# Patient Record
Sex: Female | Born: 1960 | Race: White | Hispanic: No | Marital: Married | Smoking: Never smoker
Health system: Southern US, Community
[De-identification: ages and names within clinical notes are randomized; demographics above are authoritative.]

## PROBLEM LIST (undated history)

## (undated) DIAGNOSIS — J329 Chronic sinusitis, unspecified: Secondary | ICD-10-CM

## (undated) DIAGNOSIS — C4490 Unspecified malignant neoplasm of skin, unspecified: Secondary | ICD-10-CM

## (undated) DIAGNOSIS — C439 Malignant melanoma of skin, unspecified: Secondary | ICD-10-CM

## (undated) DIAGNOSIS — H269 Unspecified cataract: Secondary | ICD-10-CM

## (undated) DIAGNOSIS — J310 Chronic rhinitis: Secondary | ICD-10-CM

## (undated) DIAGNOSIS — J40 Bronchitis, not specified as acute or chronic: Secondary | ICD-10-CM

## (undated) DIAGNOSIS — M329 Systemic lupus erythematosus, unspecified: Secondary | ICD-10-CM

## (undated) HISTORY — PX: OTHER SURGICAL HISTORY: SHX169

## (undated) HISTORY — DX: Malignant melanoma of skin, unspecified: C43.9

## (undated) HISTORY — PX: BREAST LUMPECTOMY: SHX2

## (undated) HISTORY — DX: Unspecified malignant neoplasm of skin, unspecified: C44.90

## (undated) HISTORY — DX: Bronchitis, not specified as acute or chronic: J40

## (undated) HISTORY — DX: Systemic lupus erythematosus, unspecified: M32.9

## (undated) HISTORY — PX: ACHILLES TENDON REPAIR: SUR1153

## (undated) HISTORY — DX: Unspecified cataract: H26.9

## (undated) HISTORY — PX: ABDOMINAL HYSTERECTOMY: SHX81

## (undated) HISTORY — DX: Chronic sinusitis, unspecified: J32.9

## (undated) HISTORY — PX: KNEE ARTHROPLASTY: SHX992

## (undated) HISTORY — DX: Chronic rhinitis: J31.0

---

## 1998-06-03 ENCOUNTER — Other Ambulatory Visit: Admission: RE | Admit: 1998-06-03 | Discharge: 1998-06-03 | Payer: Self-pay | Admitting: *Deleted

## 1999-06-24 ENCOUNTER — Other Ambulatory Visit: Admission: RE | Admit: 1999-06-24 | Discharge: 1999-06-24 | Payer: Self-pay | Admitting: *Deleted

## 1999-12-09 ENCOUNTER — Encounter (INDEPENDENT_AMBULATORY_CARE_PROVIDER_SITE_OTHER): Payer: Self-pay | Admitting: *Deleted

## 1999-12-09 ENCOUNTER — Other Ambulatory Visit: Admission: RE | Admit: 1999-12-09 | Discharge: 1999-12-09 | Payer: Self-pay | Admitting: *Deleted

## 2000-07-07 ENCOUNTER — Other Ambulatory Visit: Admission: RE | Admit: 2000-07-07 | Discharge: 2000-07-07 | Payer: Self-pay | Admitting: *Deleted

## 2000-07-18 ENCOUNTER — Encounter: Admission: RE | Admit: 2000-07-18 | Discharge: 2000-07-18 | Payer: Self-pay | Admitting: *Deleted

## 2000-07-18 ENCOUNTER — Encounter: Payer: Self-pay | Admitting: *Deleted

## 2009-12-14 ENCOUNTER — Telehealth: Payer: Self-pay | Admitting: Internal Medicine

## 2009-12-14 ENCOUNTER — Ambulatory Visit: Payer: Self-pay | Admitting: Internal Medicine

## 2009-12-14 DIAGNOSIS — J3089 Other allergic rhinitis: Secondary | ICD-10-CM

## 2009-12-14 DIAGNOSIS — M329 Systemic lupus erythematosus, unspecified: Secondary | ICD-10-CM

## 2009-12-14 DIAGNOSIS — J302 Other seasonal allergic rhinitis: Secondary | ICD-10-CM

## 2009-12-14 DIAGNOSIS — Z85820 Personal history of malignant melanoma of skin: Secondary | ICD-10-CM

## 2009-12-14 DIAGNOSIS — I10 Essential (primary) hypertension: Secondary | ICD-10-CM | POA: Insufficient documentation

## 2009-12-14 DIAGNOSIS — J40 Bronchitis, not specified as acute or chronic: Secondary | ICD-10-CM | POA: Insufficient documentation

## 2009-12-14 LAB — CONVERTED CEMR LAB: IgE (Immunoglobulin E), Serum: 9.6 intl units/mL (ref 0.0–180.0)

## 2010-01-18 ENCOUNTER — Ambulatory Visit: Payer: Self-pay | Admitting: Internal Medicine

## 2010-01-18 ENCOUNTER — Encounter: Payer: Self-pay | Admitting: Internal Medicine

## 2010-04-19 ENCOUNTER — Ambulatory Visit: Payer: Self-pay | Admitting: Internal Medicine

## 2010-10-18 ENCOUNTER — Ambulatory Visit: Payer: Self-pay | Admitting: Internal Medicine

## 2011-01-20 NOTE — Miscellaneous (Signed)
Summary: Orders Update pft charges  Clinical Lists Changes  Orders: Added new Service order of Carbon Monoxide diffusing w/capacity (94720) - Signed Added new Service order of Lung Volumes (94240) - Signed Added new Service order of Spirometry (Pre & Post) (94060) - Signed 

## 2011-01-20 NOTE — Assessment & Plan Note (Signed)
Summary: ROV 6 MONTHS///KP   Primary Provider/Referring Provider:  Deans  CC:  6 month follow up visit-sinus congestion and pressure when sneezing and coughing(mainly in forehead area)..  History of Present Illness: January 18, 2010- Bronchitis, Lupus, Allergy...............................Marland Kitchenwith husband Here to review PFT and for skin testing as planned. She started right frontal headache, bad taste, nasal congestion 2 days ago. Today woke hoarse. Chest is ok and denies fever or purulent discharge. PFT-WNL except borderline low Diffusion. FEV1/FVC 0.86 with small airways improved after dilator. Skin test-  Apr 19, 2010- Bronchitis, Lupus, Allergy She has not had bronchitis this Spring, but otherwise says this year is similar to last year. Her opthalmologist suggested otc eyedrop, so she is using Visine.AC. We discussed antihistamines and decongestants. She has been doing the dust control measures etc that we taught her last time. She is very averse to needles and doesn't consider allergy vaccine a real option now.,  October 18, 2010- Bronchitis, Lupus, Allergy Since last here here has done fairly well. Was in [place] where she got very large local reactions to mosquito bites. Wheeze and cough have done much better. Her Singulair and rescue inhaler control bronchitis ok. Cleaning window screens tightened her chest. Uses her rescue inhaler up to 4x/day since beginning of September. Insurance didn't approve Advair.  Sinuses have bothered her. For 2 weeks has had tussive sharp pain in right frontal area, stuffy, nonproducitve. Ears ok Throat- postnasal drip. Does not like Neti pot experience.   Preventive Screening-Counseling & Management  Alcohol-Tobacco     Smoking Status: never  Current Medications (verified): 1)  Synthroid 50 Mcg Tabs (Levothyroxine Sodium) .... Take 1 Tablet By Mouth Once A Day 2)  Claritin 10 Mg Tabs (Loratadine) .... Take 1 Tablet By Mouth Once A Day 3)   Lisinopril 30 Mg Tabs (Lisinopril) .... Take 1 By Mouth Once Daily 4)  Nasonex 50 Mcg/act Susp (Mometasone Furoate) .Marland Kitchen.. 1-2 Sprays Each Nostril Once Daily 5)  Centrum Performance  Tabs (Specialty Vitamins Products) .... Take 1 Tablet By Mouth Once A Day 6)  Vitamin C 500 Mg  Tabs (Ascorbic Acid) .... Take 1 Tablet By Mouth Once A Day With Food 7)  Hydroxychloroquine Sulfate 200 Mg Tabs (Hydroxychloroquine Sulfate) .... Take 1 Tablet By Mouth Two Times A Day W/ Meal 8)  Vitamin D 1000 Unit Tabs (Cholecalciferol) .... Take 1 Tablet By Mouth Once A Day 9)  Singulair 10 Mg Tabs (Montelukast Sodium) .... Take 1 Tablet By Mouth Once A Day 10)  Ventolin Hfa 108 (90 Base) Mcg/act Aers (Albuterol Sulfate) .... Inhale 2 Puffs Every Four Hours As Needed 11)  Sulindac 200 Mg Tabs (Sulindac) .... Take 1 Tablet By Mouth Two Times A Day As Needed 12)  Clotrimazole 1 % Crea (Clotrimazole) .... Apply As Needed For Rash/itching 13)  Nabumetone 750 Mg Tabs (Nabumetone) .... Take 1 Tablet By Mouth Two Times A Day As Needed 14)  Mupirocin 2 % Oint (Mupirocin) .... Apply Every 3 Hours On Skin Abrasion 15)  Align  Caps (Probiotic Product) .... Take 1 By Mouth Once Daily 16)  Biotene Pbf Dry Mouth  Liqd (Mouthwashes) .... As Directed 17)  Vitamin B Complex  Tabs (B Complex Vitamins) .... Take 1 By Mouth Once Daily 18)  Nuvigil 250 Mg Tabs (Armodafinil) .... Take 1/2 By Mouth Once Daily  Allergies (verified): 1)  ! Neosporin 2)  ! 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   Risk Factors: Smoking Status: never (10/18/2010)  Review of Systems      See HPI       The patient complains of shortness of breath with activity and non-productive cough.  The patient denies shortness of breath at rest, productive cough, coughing up blood, chest pain, irregular heartbeats, acid heartburn, indigestion, loss of appetite, weight change, abdominal pain, difficulty swallowing, sore throat, tooth/dental problems, headaches, nasal congestion/difficulty breathing through nose, sneezing, itching, and fever.    Vital Signs:  Patient profile:   50 year old female Height:      63 inches Weight:      196.50 pounds BMI:     34.93 O2 Sat:      99 % on Room air Pulse rate:   75 / minute BP sitting:   122 / 78  (left arm) Cuff size:   regular  Vitals Entered By: Reynaldo Minium CMA (October 18, 2010 4:32 PM)  O2 Flow:  Room air CC: 6 month follow up visit-sinus congestion and pressure when sneezing and coughing(mainly in forehead area).   Physical Exam  Additional Exam:  General: A/Ox3; pleasant and cooperative, NAD, SKIN: no rash, lesions NODES: no lymphadenopathy HEENT: [place]/AT, EOM- WNL, Conjuctivae- clear, PERRLA, TM-WNL, Nose- clear, narrower on right, turbinate edema, Throat- clear and wnl, Mallampati  II NECK: Supple w/ fair ROM, JVD- none, normal carotid impulses w/o bruits Thyroid- normal to palpation CHEST: Clear to P&A HEART: RRR, no m/g/r heard ABDOMEN: Soft and nl; n ZOX:WRUE, nl pulses, no edema  NEURO: Grossly intact to observation      Impression & Recommendations:  Problem # 1:  ALLERGY, HX OF (ICD-V15.09)  Rhinitis. She has nasonex and is cautious about systemic steroids due to her lupus. We will have her use a decongestant and Musiniex  Problem # 2:  BRONCHITIS (ICD-490)  She is depending too much on her rescue inhaler. We will have her try sample Symbicort with explanation.    Her updated medication list for this problem includes:    Singulair 10 Mg Tabs (Montelukast sodium) .Marland Kitchen... Take 1 tablet by mouth once a day    Ventolin Hfa 108 (90 Base) Mcg/act Aers (Albuterol sulfate) ..... Inhale 2 puffs every four hours as needed    Symbicort 160-4.5 Mcg/act Aero (Budesonide-formoterol fumarate) .Marland Kitchen... 2 puffs and rinse mouth, twice daily  Problem # 3:  BRONCHITIS (ICD-490)  Medications Added to Medication List This Visit: 1)  Vitamin B Complex Tabs (B complex vitamins) .... Take 1 by mouth once daily 2)  Nuvigil 250 Mg Tabs (Armodafinil) .... Take 1/2 by mouth once daily 3)  Symbicort 160-4.5 Mcg/act Aero (Budesonide-formoterol fumarate) .... 2 puffs and rinse mouth, twice daily  Other Orders: Est. Patient Level IV (45409)   Patient Instructions: 1)  Please schedule a follow-up appointment in 6 months. 2)  Sample / script Symbicort 160/4.5 maintenance inhaler; 3)   2 puffs and rinse mouth, twice every day 4)  You can still use the rescue inhaler in addition as needed 5)  try Mucinex as a mucus thinner, and phenylephrine (Sudafed-PE and others) otc as a  decongestant Prescriptions: SYMBICORT 160-4.5 MCG/ACT AERO (BUDESONIDE-FORMOTEROL FUMARATE) 2 puffs and rinse mouth, twice daily  #1 x prn   Entered and Authorized by:   Waymon Budge MD   Signed by:   Waymon Budge MD on 10/18/2010   Method used:   Print then Give to Patient   RxID:   [id number]

## 2011-01-20 NOTE — Miscellaneous (Signed)
Summary: Occupational psychologist   Imported By: Lester [place] 01/27/2010 09:14:57  _____________________________________________________________________  External Attachment:    Type:   Image     Comment:   External Document

## 2011-01-20 NOTE — Assessment & Plan Note (Signed)
Summary: allergy testing after PFT/kcw   Vital Signs:  Patient profile:   50 year old female Height:      63 inches Weight:      198 pounds O2 Sat:      99 % on Room air Pulse rate:   87 / minute BP sitting:   152 / 84  (left arm) Cuff size:   regular  Vitals Entered By: Reynaldo Minium CMA (January 18, 2010 2:05 PM)  O2 Flow:  Room air  Primary Provider/Referring Provider:  Deans   History of Present Illness: December 14, 2009-  48 yoF seen in allergy referral at kind request of Dr Quintella Reichert Ronnald Collum Deans in [place]. She reports "allergy" since her teens, with nasal congestion, headaches, sneezing, and a few episodes of hives. She has had several episodes of sinusitis and bronchitis but not dx'd with asthma, Pollen seasons and barn dust exposure have been triggers.  She had persistent tight chest this Fall in October. 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 Frequent colds. Never smked. [place] had laryngoscopy in high school for laryngitis. reports allergy/ intolerance to onions, strawberries, milk, pineapple. Also outdoors to alfalfa, hay, straw, bee stings, latex in bandaids.  January 18, 2010- Bronchitis, Lupus, Allergy...............................Marland Kitchenwith husband Here to review PFT and for skin testing as planned. She started right frontal headache, bad taste, nasal congestion 2 days ago. Today woke hoarse. Chest is ok and denies fever or purulent discharge. PFT-WNL except borderline low Diffusion. FEV1/FVC 0.86 with small airways improved after dilator. Skin test-  Current Medications (verified): 1)  Synthroid 50 Mcg Tabs (Levothyroxine Sodium) .... Take 1 Tablet By Mouth Once A Day 2)  Claritin 10 Mg Tabs (Loratadine) .... Take 1 Tablet By Mouth Once A Day 3)  Lisinopril 20 Mg Tabs (Lisinopril) .... Take 1 Tablet By Mouth Once A Day 4)  Nasonex 50 Mcg/act Susp (Mometasone Furoate) .Marland Kitchen.. 1-2 Sprays Each Nostril Once Daily 5)  Centrum  Performance  Tabs (Specialty Vitamins Products) .... Take 1 Tablet By Mouth Once A Day 6)  Vitamin C 500 Mg  Tabs (Ascorbic Acid) .... Take 1 Tablet By Mouth Once A Day With Food 7)  Hydroxychloroquine Sulfate 200 Mg Tabs (Hydroxychloroquine Sulfate) .... Take 1 Tablet By Mouth Two Times A Day W/ Meal 8)  Vitamin D 1000 Unit Tabs (Cholecalciferol) .... Take 1 Tablet By Mouth Once A Day 9)  Singulair 10 Mg Tabs (Montelukast Sodium) .... Take 1 Tablet By Mouth Once A Day 10)  Ventolin Hfa 108 (90 Base) Mcg/act Aers (Albuterol Sulfate) .... Inhale 2 Puffs Every Four Hours As Needed 11)  Sulindac 200 Mg Tabs (Sulindac) .... Take 1 Tablet By Mouth Two Times A Day As Needed 12)  Clotrimazole 1 % Crea (Clotrimazole) .... Apply As Needed For Rash/itching 13)  Nabumetone 750 Mg Tabs (Nabumetone) .... Take 1 Tablet By Mouth Two Times A Day As Needed 14)  Mupirocin 2 % Oint (Mupirocin) .... Apply Every 3 Hours On Skin Abrasion  Allergies (verified): 1)  ! * Latec 2)  ! Neosporin  Past History:  Past Surgical History: Last updated: 12/14/2009 hysterectomy 11/1999  Family History: Last updated: 12/14/2009 allergies - father heart disease - father died bacterial endocarditis cancer - MGM (pancreatic)  Social History: Last updated: 12/14/2009 pt lives with her husband, 3 boys works as a teacher--k-5 gifted ed in [employer] Patient never smoked.   Risk Factors: Smoking Status: never (12/14/2009)  Past Medical History:  Allergic rhinitis- Skin Test 01/18/10 bronchitis rhinosinusitis systemic lupus- joints and skin rash- Dr Corliss Skains  Review of Systems      See HPI  The patient denies anorexia, fever, weight loss, weight gain, vision loss, decreased hearing, hoarseness, chest pain, syncope, dyspnea on exertion, peripheral edema, prolonged cough, headaches, hemoptysis, abdominal pain, and severe indigestion/heartburn.    Physical Exam  Additional Exam:  General: A/Ox3; pleasant and  cooperative, NAD, stocky SKIN: no rash, lesions. Minor flush on lateral cheeks NODES: no lymphadenopathy HEENT: [place]/AT, EOM- WNL, Conjuctivae- clear, PERRLA, TM-WNL, Nose- turbinate edema, Throat- slight;ly red, [place]  II-III, hoarse  NECK: Supple w/ fair ROM, JVD- none, normal carotid impulses w/o bruits Thyroid- normal to palpation CHEST: Clear to P&A, slight cough after laughter HEART: RRR, no m/g/r heard ABDOMEN: Soft and nl; nml bowel sounds; no organomegaly or masses noted ZOX:WRUE, nl pulses, no edema  NEURO: Grossly intact to observation   General: A/Ox3; pleasant and cooperative, NAD, stocky SKIN: no rash, lesions. Minor flush on lateral cheeks NODES: no lymphadenopathy HEENT: /AT, EOM- WNL, Conjuctivae- clear, PERRLA, TM-WNL, Nose- turbinate edema, Throat- slight;ly red, [place]  II-III, hoarse  NECK: Supple w/ fair ROM, JVD- none, normal carotid impulses w/o bruits Thyroid- normal to palpation CHEST: Clear to P&A, slight cough after laughter HEART: RRR, no m/g/r heard ABDOMEN: Soft and nl; nml bowel sounds; no organomegaly or masses noted AVW:UJWJ, nl pulses, no edema  NEURO: Grossly intact to observation      Impression & Recommendations:  Problem # 1:  BRONCHITIS (ICD-490)  Now with an early viral syndrome and probable mild frontal siusitis. Her updated medication list for this problem includes:    Singulair 10 Mg Tabs (Montelukast sodium) .Marland Kitchen... Take 1 tablet by mouth once a day    Ventolin Hfa 108 (90 Base) Mcg/act Aers (Albuterol sulfate) ..... Inhale 2 puffs every four hours as needed  Problem # 2:  ALLERGY, HX OF (ICD-V15.09)  Significant positive skin testing. We discussed available therapies and gave print info with discussion of environmental precautions. She wants to stick with present treatments for now. We will bring her back in the Spring around the grass pollen season when she predicts symptoms.  Other Orders: Est. Patient Level II  (19147)  Patient Instructions: 1)  Please schedule a follow-up appointment in 3 months. 2)  Consider the allergy avoidance measures we discussed 3)  Continue present meds 4)  Clarinex 5 mg given today after skin testing

## 2011-01-20 NOTE — Assessment & Plan Note (Signed)
Summary: 3 months/apc   Primary Provider/Referring Provider:  Deans  CC:  3 month follow up visit.  History of Present Illness: December 14, 2009-  50 yoF seen in allergy referral at kind request of Dr Quintella Reichert Ronnald Collum Deans in [place]. She reports "allergy" since her teens, with nasal congestion, headaches, sneezing, and a few episodes of hives. She has had several episodes of sinusitis and bronchitis but not dx'd with asthma, Pollen seasons and barn dust exposure have been triggers.  She had persistent tight chest this Fall in October. 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 Frequent colds. Never smked. [place] farm. had laryngoscopy in high school for laryngitis. reports allergy/ intolerance to onions, strawberries, milk, pineapple. Also outdoors to alfalfa, hay, straw, bee stings, latex in bandaids.  January 18, 2010- Bronchitis, Lupus, Allergy...............................Marland Kitchenwith husband Here to review PFT and for skin testing as planned. She started right frontal headache, bad taste, nasal congestion 2 days ago. Today woke hoarse. Chest is ok and denies fever or purulent discharge. PFT-WNL except borderline low Diffusion. FEV1/FVC 0.86 with small airways improved after dilator. Skin test-  Apr 19, 2010- Bronchitis, Lupus, Allergy She has not had bronchitis this Spring, but otherwise says this year is similar to last year. Her opthalmologist suggested otc eyedrop, so she is using Visine.AC. We discussed antihistamines and decongestants. She has been doing the dust control measures etc that we taught her last time. She is very averse to needles and doesn't consider allergy vaccine a real option now.,   Current Medications (verified): 1)  Synthroid 50 Mcg Tabs (Levothyroxine Sodium) .... Take 1 Tablet By Mouth Once A Day 2)  Claritin 10 Mg Tabs (Loratadine) .... Take 1 Tablet By Mouth Once A Day 3)  Lisinopril 30 Mg Tabs (Lisinopril) .... Take 1 By Mouth  Once Daily 4)  Nasonex 50 Mcg/act Susp (Mometasone Furoate) .Marland Kitchen.. 1-2 Sprays Each Nostril Once Daily 5)  Centrum Performance  Tabs (Specialty Vitamins Products) .... Take 1 Tablet By Mouth Once A Day 6)  Vitamin C 500 Mg  Tabs (Ascorbic Acid) .... Take 1 Tablet By Mouth Once A Day With Food 7)  Hydroxychloroquine Sulfate 200 Mg Tabs (Hydroxychloroquine Sulfate) .... Take 1 Tablet By Mouth Two Times A Day W/ Meal 8)  Vitamin D 1000 Unit Tabs (Cholecalciferol) .... Take 1 Tablet By Mouth Once A Day 9)  Singulair 10 Mg Tabs (Montelukast Sodium) .... Take 1 Tablet By Mouth Once A Day 10)  Ventolin Hfa 108 (90 Base) Mcg/act Aers (Albuterol Sulfate) .... Inhale 2 Puffs Every Four Hours As Needed 11)  Sulindac 200 Mg Tabs (Sulindac) .... Take 1 Tablet By Mouth Two Times A Day As Needed 12)  Clotrimazole 1 % Crea (Clotrimazole) .... Apply As Needed For Rash/itching 13)  Nabumetone 750 Mg Tabs (Nabumetone) .... Take 1 Tablet By Mouth Two Times A Day As Needed 14)  Mupirocin 2 % Oint (Mupirocin) .... Apply Every 3 Hours On Skin Abrasion 15)  Align  Caps (Probiotic Product) .... Take 1 By Mouth Once Daily 16)  Biotene Pbf Dry Mouth  Liqd (Mouthwashes) .... As Directed  Allergies (verified): 1)  ! Neosporin 2)  ! 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   Risk Factors: Smoking Status: never (12/14/2009)  Review of Systems      See HPI  The patient denies anorexia, fever, weight loss,  weight gain, vision loss, decreased hearing, hoarseness, chest pain, syncope, dyspnea on exertion, peripheral edema, prolonged cough, headaches, hemoptysis, abdominal pain, and severe indigestion/heartburn.    Vital Signs:  Patient profile:   50 year old female Height:      63 inches Weight:      200 pounds BMI:     35.56 O2 Sat:      99 % on Room air Pulse rate:   66 / minute BP sitting:   132 / 76  (left arm) Cuff size:   regular  Vitals Entered By: Reynaldo Minium CMA (Apr 19, 2010 4:25 PM)  O2 Flow:  Room air  Physical Exam  Additional Exam:  General: A/Ox3; pleasant and cooperative, NAD, SKIN: no rash, lesions NODES: no lymphadenopathy HEENT: [place]/AT, EOM- WNL, Conjuctivae- clear, PERRLA, TM-WNL, Nose- clear, narrower on right, Throat- clear and wnl, Mallampati  II NECK: Supple w/ fair ROM, JVD- none, normal carotid impulses w/o bruits Thyroid- normal to palpation CHEST: Clear to P&A HEART: RRR, no m/g/r heard ABDOMEN: Soft and nl; n EAV:WUJW, nl pulses, no edema  NEURO: Grossly intact to observation      Impression & Recommendations:  Problem # 1:  ALLERGY, HX OF (ICD-V15.09)  She will try combinations of claritin and Sudafed_PE. We can return to discussion of allergy vaccine in the future if needed.  Problem # 2:  BRONCHITIS (ICD-490)  This had been an important concern for  her but has largely cleared for now. Her updated medication list for this problem includes:    Singulair 10 Mg Tabs (Montelukast sodium) .Marland Kitchen... Take 1 tablet by mouth once a day    Ventolin Hfa 108 (90 Base) Mcg/act Aers (Albuterol sulfate) ..... Inhale 2 puffs every four hours as needed  Medications Added to Medication List This Visit: 1)  Lisinopril 30 Mg Tabs (Lisinopril) .... Take 1 by mouth once daily 2)  Align Caps (Probiotic product) .... Take 1 by mouth once daily 3)  Biotene Pbf Dry Mouth Liqd (Mouthwashes) .... As directed  Other Orders: Est. Patient Level III (11914)  Patient  Instructions: 1)  Please schedule a follow-up appointment in 6 months. 2)  Use a generic otc antihistamine like Loratadine (Claritin), or  3)  cetirizine (Zyrtec) if needed for drainage, runny nose, itching 4)  Try otc decongestant Sudafed-PE as needed, either by itself or with an antihistamine.  5)  You can continue your other meds as you find them helpful.

## 2011-03-14 ENCOUNTER — Telehealth: Payer: Self-pay | Admitting: Internal Medicine

## 2011-03-14 MED ORDER — MOMETASONE FUROATE 50 MCG/ACT NA SUSP: 2.0000 | Freq: Every day | NASAL | Status: DC

## 2011-03-14 NOTE — Telephone Encounter (Signed)
Spoke with pt to verify request.  Rx for nasonex was sent to pharm. Pt advised to keep April appt with CDY,

## 2011-03-14 NOTE — Telephone Encounter (Signed)
lmomtcb x1  [place], CMA  

## 2011-04-15 ENCOUNTER — Encounter: Payer: Self-pay | Admitting: Internal Medicine

## 2011-04-18 ENCOUNTER — Ambulatory Visit (INDEPENDENT_AMBULATORY_CARE_PROVIDER_SITE_OTHER): Payer: BC Managed Care – PPO | Admitting: Internal Medicine

## 2011-04-18 ENCOUNTER — Encounter: Payer: Self-pay | Admitting: Internal Medicine

## 2011-04-18 VITALS — BP 130/74 | HR 77 | Ht 63.0 in | Wt 188.6 lb

## 2011-04-18 DIAGNOSIS — J40 Bronchitis, not specified as acute or chronic: Secondary | ICD-10-CM

## 2011-04-18 DIAGNOSIS — Z9109 Other allergy status, other than to drugs and biological substances: Secondary | ICD-10-CM

## 2011-04-18 MED ORDER — CLOTRIMAZOLE-BETAMETHASONE 1-0.05 % EX CREA: TOPICAL_CREAM | CUTANEOUS | Status: AC

## 2011-04-18 MED ORDER — MONTELUKAST SODIUM 10 MG PO TABS: 10.0000 mg | ORAL_TABLET | Freq: Every day | ORAL | Status: DC

## 2011-04-18 MED ORDER — MUPIROCIN 2 % EX OINT: 1.0000 "application " | TOPICAL_OINTMENT | Freq: Three times a day (TID) | CUTANEOUS | Status: DC

## 2011-04-18 MED ORDER — PHENYLEPHRINE HCL 1 % NA SOLN
3.0000 [drp] | Freq: Once | NASAL | Status: AC
  Administered 2011-04-18: 3 [drp] via NASAL

## 2011-04-18 MED ORDER — METHYLPREDNISOLONE ACETATE 80 MG/ML IJ SUSP
80.0000 mg | Freq: Once | INTRAMUSCULAR | Status: AC
  Administered 2011-04-18: 80 mg via INTRAMUSCULAR

## 2011-04-18 NOTE — Patient Instructions (Signed)
Neb neo nasal  Depo 80  Please call as needed.  Med refills

## 2011-04-18 NOTE — Progress Notes (Signed)
  Subjective:    Patient ID: [place], female    DOB: Jul 15, 1961, 50 y.o.   MRN: [id number]  HPI 50 yoF followed for allergy and bronchitis. Last here October 18, 2010. She did ok during the winter till she went on vacation to [place] then [place] in [place] where she blames their early pollen season for starting retroorbital pressure hoarseness. Sudafed helps some. -PE doesn't help. Sense of smell ok. Denies wheeze. Scant mucus is clear. Denies fever or sore throat. Does not like Net pot. Continues nasonex, claritin and Singulair. Has not needed her rescue inhaler much at all.   Review of Systems Constitutional:   No weight loss, night sweats,  Fevers, chills, fatigue, lassitude. HEENT:   No headaches,  Difficulty swallowing,  Tooth/dental problems,  Sore throat,                No sneezing, itching, ear ache, post nasal drip,   CV:  No chest pain,  Orthopnea, PND, swelling in lower extremities, anasarca, dizziness, palpitations  GI  No heartburn, indigestion, abdominal pain, nausea, vomiting, diarrhea, change in bowel habits, loss of appetite  Resp: No shortness of breath with exertion or at rest.  No excess mucus, no productive cough,  No non-productive cough,  No coughing up of blood.  No change in color of mucus.  No wheezing. Skin: no rash or lesions.  GU: no dysuria, change in color of urine, no urgency or frequency.  No flank pain.  MS:  No joint pain or swelling.  No decreased range of motion.  No back pain.  Psych:  No change in mood or affect. No depression or anxiety.  No memory loss.      Objective:   Physical Exam General- Alert, Oriented, Affect-appropriate, Distress- none acute  Skin- rash-none, lesions- none, excoriation- none  Lymphadenopathy- none  Head- atraumatic  Eyes- Gross vision intact, PERRLA, conjunctivae clear secretions  Ears-Normal- Hearing, canals, Tm Nose- Clear, No-Septal dev, mucus, polyps, erosion, perforation   Throat- Mallampati  II , mucosa clear , drainage- none, tonsils- atrophic    Very hoarse  Neck- flexible , trachea midline, no stridor , thyroid nl, carotid no bruit  Chest - symmetrical excursion , unlabored     Heart/CV- RRR , no murmur , no gallop  , no rub, nl s1 s2                     - JVD- none , edema- none, stasis changes- none, varices- none     Lung- clear to P&A, wheeze- none, cough- none , dullness-none, rub- none     Chest wall- Abd- tender-no, distended-no, bowel sounds-present, HSM- no  Br/ Gen/ Rectal- Not done, not indicated  Extrem- cyanosis- none, clubbing, none, atrophy- none, strength- nl  Neuro- grossly intact to observation         Assessment & Plan:

## 2011-04-21 ENCOUNTER — Encounter: Payer: Self-pay | Admitting: Internal Medicine

## 2011-04-21 NOTE — Assessment & Plan Note (Addendum)
Current upper airway complaints are likely due to seasonal pollens and allergic rhinitis. Antihistamines and Nasonex are usually sufficient. For now we will try neb and depo to regain control.

## 2011-04-21 NOTE — Assessment & Plan Note (Signed)
Lower respiratory symptoms are much better controlled at present. Meds reviewed.

## 2011-09-19 DIAGNOSIS — R413 Other amnesia: Secondary | ICD-10-CM

## 2011-10-24 ENCOUNTER — Encounter: Payer: Self-pay | Admitting: Internal Medicine

## 2011-10-24 ENCOUNTER — Ambulatory Visit (INDEPENDENT_AMBULATORY_CARE_PROVIDER_SITE_OTHER): Payer: BC Managed Care – PPO | Admitting: Internal Medicine

## 2011-10-24 VITALS — BP 132/82 | HR 81 | Ht 63.0 in | Wt 183.0 lb

## 2011-10-24 DIAGNOSIS — J019 Acute sinusitis, unspecified: Secondary | ICD-10-CM

## 2011-10-24 DIAGNOSIS — Z9109 Other allergy status, other than to drugs and biological substances: Secondary | ICD-10-CM

## 2011-10-24 MED ORDER — BUDESONIDE-FORMOTEROL FUMARATE 160-4.5 MCG/ACT IN AERO: 2.0000 | INHALATION_SPRAY | Freq: Two times a day (BID) | RESPIRATORY_TRACT | Status: DC

## 2011-10-24 MED ORDER — MOMETASONE FUROATE 50 MCG/ACT NA SUSP: 2.0000 | Freq: Every day | NASAL | Status: DC

## 2011-10-24 MED ORDER — AMOXICILLIN-POT CLAVULANATE 875-125 MG PO TABS: 1.0000 | ORAL_TABLET | Freq: Two times a day (BID) | ORAL | Status: AC

## 2011-10-24 NOTE — Assessment & Plan Note (Signed)
Acute right maxillary sinusitis with laryngitis. She dislikes using a saline lavage. She will continue Nasonex and we are adding Augmentin.

## 2011-10-24 NOTE — Assessment & Plan Note (Signed)
She'll continue taking antihistamine. Consider food diary. Avoid suspect foods, understanding that is the key treatment.

## 2011-10-24 NOTE — Patient Instructions (Addendum)
Refill scripts sent  Script for antibiotic sent  Please call as needed  Flu vax

## 2011-10-24 NOTE — Progress Notes (Signed)
Patient ID: Misty Curtis, female    DOB: December 08, 1961, 50 y.o.   MRN: [id number]  HPI 68 yoF followed for allergy and bronchitis. Last here October 18, 2010. She did ok during the winter till she went on vacation to [place] then [place] in [place] where she blames their early pollen season for starting retroorbital pressure hoarseness. Sudafed helps some. -PE doesn't help. Sense of smell ok. Denies wheeze. Scant mucus is clear. Denies fever or sore throat. Does not like Net pot. Continues nasonex, claritin and Singulair. Has not needed her rescue inhaler much at all.   10/24/11- [name] followed for allergy and bronchitis, complicated by HBP, lupus.Marland Kitchen PCP Dr Nathanial Rancher She blames dust after raking leaves and seasonal weather change for hoarseness in recent days. Right maxillary sinus pressure. Has to blow her nose out each morning. Episode of chest tightness one week ago quickly responded to a rescue inhaler. Otherwise has not needed to use that inhaler. Denies fever sore throat or nodes. She says she depends on Nasonex, occasional Sudafed, saline rinse. She had an episode when she was chopping help jalapeno peppers- touch her hand with the juice to her lip and rapidly got angioedema of the lip. She knows to avoid that exposure.   Review of Systems Constitutional:   No-   weight loss, night sweats, fevers, chills, fatigue, lassitude. HEENT:   No-  headaches, difficulty swallowing, tooth/dental problems, sore throat,       No-  sneezing, itching, ear ache, +nasal congestion, post nasal drip,  CV:  No-   chest pain, orthopnea, PND, swelling in lower extremities, anasarca, dizziness, palpitations Resp: No-   shortness of breath with exertion or at rest.              No-   productive cough,  No non-productive cough,  No- coughing up of blood.              No-   change in color of mucus.  Some wheezing.   Skin: No-   rash or lesions. GI:  No-   heartburn, indigestion, abdominal pain, nausea,  vomiting, diarrhea,                 change in bowel habits, loss of appetite GU: No-   dysuria, change in color of urine, no urgency or frequency.  No- flank pain. MS:  No-   joint pain or swelling.  No- decreased range of motion.  No- back pain. Neuro-     nothing unusual Psych:  No- change in mood or affect. No depression or anxiety.  No memory loss.   Objective:   Physical Exam General- Alert, Oriented, Affect-appropriate, Distress- none acute Skin- rash-none, lesions- none, excoriation- none Lymphadenopathy- none Head- atraumatic            Eyes- Gross vision intact, PERRLA, conjunctivae clear secretions            Ears- Hearing, canals-normal            Nose- Clear, no-Septal dev, mucus, polyps, erosion, perforation             Throat- Mallampati II , mucosa clear , drainage- none, tonsils- atrophic. Distinctly forced but with no visible postnasal drainage and minimal redness of her pharynx. Neck- flexible , trachea midline, no stridor , thyroid nl, carotid no bruit Chest - symmetrical excursion , unlabored           Heart/CV- RRR , no murmur , no gallop  ,  no rub, nl s1 s2                           - JVD- none , edema- none, stasis changes- none, varices- none           Lung- clear to P&A, wheeze- none, cough- none , dullness-none, rub- none           Chest wall-  Abd- tender-no, distended-no, bowel sounds-present, HSM- no Br/ Gen/ Rectal- Not done, not indicated Extrem- cyanosis- none, clubbing, none, atrophy- none, strength- nl Neuro- grossly intact to observation

## 2012-04-27 ENCOUNTER — Ambulatory Visit (INDEPENDENT_AMBULATORY_CARE_PROVIDER_SITE_OTHER): Payer: BC Managed Care – PPO | Admitting: Internal Medicine

## 2012-04-27 ENCOUNTER — Encounter: Payer: Self-pay | Admitting: Internal Medicine

## 2012-04-27 VITALS — BP 116/74 | HR 82 | Ht 63.0 in | Wt 188.0 lb

## 2012-04-27 DIAGNOSIS — J40 Bronchitis, not specified as acute or chronic: Secondary | ICD-10-CM

## 2012-04-27 DIAGNOSIS — R0982 Postnasal drip: Secondary | ICD-10-CM

## 2012-04-27 NOTE — Progress Notes (Signed)
Patient ID: Misty Curtis, female    DOB: 1961/04/26, 51 y.o.   MRN: [id number]  HPI 51 yoF followed for allergy and bronchitis. Last here October 18, 2010. She did ok during the winter till she went on vacation to [place] then [place] in [place] where she blames their early pollen season for starting retroorbital pressure hoarseness. Sudafed helps some. -PE doesn't help. Sense of smell ok. Denies wheeze. Scant mucus is clear. Denies fever or sore throat. Does not like Net pot. Continues nasonex, claritin and Singulair. Has not needed her rescue inhaler much at all.   10/24/11- 51 yoF followed for allergy and bronchitis, complicated by HBP, lupus.Marland Kitchen PCP Dr Nathanial Rancher She blames dust after raking leaves and seasonal weather change for hoarseness in recent days. Right maxillary sinus pressure. Has to blow her nose out each morning. Episode of chest tightness one week ago quickly responded to a rescue inhaler. Otherwise has not needed to use that inhaler. Denies fever sore throat or nodes. She says she depends on Nasonex, occasional Sudafed, saline rinse. She had an episode when she was chopping help jalapeno peppers- touch her hand with the juice to her lip and rapidly got angioedema of the lip. She knows to avoid that exposure.  04/27/12- 51 yoF followed for allergy and bronchitis, complicated by HBP, lupus.Marland Kitchen PCP Dr Nathanial Rancher Increased laryngitis; ? wheezing episodes(wind was up today);feels tight in chest but not painful She felt well through the winter until spring. Has new heating and air-conditioning duct works in her home which helps. 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 Better temporarily when that unit was bleached. Denies reflux/GERD but does notice postnasal drip and gets wheezy. Using rescue inhaler not more than once per day.  Review of Systems-see HPI Constitutional:   No-   weight loss, night sweats, fevers, chills, fatigue, lassitude. HEENT:   No-  headaches,  difficulty swallowing, tooth/dental problems, sore throat,       No-  sneezing, itching, ear ache, +nasal congestion, +post nasal drip,  CV:  No-   chest pain, orthopnea, PND, swelling in lower extremities, anasarca, dizziness, palpitations Resp: No-   shortness of breath with exertion or at rest.              No-   productive cough,  No non-productive cough,  No- coughing up of blood.              No-   change in color of mucus.  Little wheezing.   Skin: No-   rash or lesions. GI:  No-   heartburn, indigestion, abdominal pain, nausea, vomiting,  GU:  MS:  No-   joint pain or swelling.  Neuro-     nothing unusual Psych:  No- change in mood or affect. No depression or anxiety.  No memory loss.   Objective:   Physical Exam General- Alert, Oriented, Affect-appropriate, Distress- none acute, obese Skin- rash-none, lesions- none, excoriation- none Lymphadenopathy- none Head- atraumatic            Eyes- Gross vision intact, PERRLA, conjunctivae clear secretions            Ears- Hearing, canals-normal            Nose- Clear, no-Septal dev, mucus, polyps, erosion, perforation             Throat- Mallampati II , mucosa clear , drainage- none, tonsils- atrophic. No visible postnasal drainage and minimal redness of her pharynx.Mildly raspy voice.  Neck- flexible , trachea midline, no stridor , thyroid nl, carotid no bruit Chest - symmetrical excursion , unlabored           Heart/CV- RRR , no murmur , no gallop  , no rub, nl s1 s2                           - JVD- none , edema- none, stasis changes- none, varices- none           Lung- clear to P&A, wheeze- none, cough- none , dullness-none, rub- none           Chest wall-  Abd-  Br/ Gen/ Rectal- Not done, not indicated Extrem- cyanosis- none, clubbing, none, atrophy- none, strength- nl Neuro- grossly intact to observation

## 2012-04-27 NOTE — Patient Instructions (Signed)
Sample Dymista nasal spray-  Try 1 or 2 puffs each nostril once daily at bedtime  Consider a HEPA air filter in your work place.   Ask Dr Nathanial Rancher about a trial of something (maybe an "ARB") instead of lisinopril for a month, to see if that affects your hoarseness and throat.

## 2012-05-02 DIAGNOSIS — R0982 Postnasal drip: Secondary | ICD-10-CM | POA: Insufficient documentation

## 2012-05-02 NOTE — Assessment & Plan Note (Signed)
This may be a chronic mild asthma/bronchitis. I would like her to try an alternative to lisinopril to see if that affects hoarseness and cough

## 2012-05-02 NOTE — Assessment & Plan Note (Signed)
Try sample Dymista Try HEPA filter in work place since she gets hoarse there

## 2012-05-06 ENCOUNTER — Other Ambulatory Visit: Payer: Self-pay | Admitting: Internal Medicine

## 2012-05-08 ENCOUNTER — Telehealth: Payer: Self-pay | Admitting: Internal Medicine

## 2012-05-08 MED ORDER — AZELASTINE-FLUTICASONE 137-50 MCG/ACT NA SUSP: 1.0000 | Freq: Every day | NASAL | Status: DC

## 2012-05-08 NOTE — Telephone Encounter (Signed)
I spoke with pt and she was needing rx for dymista sent to [facility]. I advised will send rx and nothing further was needed

## 2012-06-12 ENCOUNTER — Telehealth: Payer: Self-pay | Admitting: Internal Medicine

## 2012-06-12 MED ORDER — CLOTRIMAZOLE-BETAMETHASONE 1-0.05 % EX CREA: TOPICAL_CREAM | CUTANEOUS | Status: DC

## 2012-06-12 NOTE — Telephone Encounter (Signed)
Per CY-okay to refill as requested. 

## 2012-06-12 NOTE — Telephone Encounter (Signed)
[facility]  [address] requesting  Clotrimazole-betamethasone external cream 1-0.05%  <> apply to affected area twice daily  Allergies  Allergen Reactions  . Latex   . Neomycin-Bacitracin Zn-Polymyx     REACTION: rash   Dr Maple Hudson please advise  Thank you

## 2012-11-01 ENCOUNTER — Encounter: Payer: Self-pay | Admitting: Internal Medicine

## 2012-11-01 ENCOUNTER — Ambulatory Visit (INDEPENDENT_AMBULATORY_CARE_PROVIDER_SITE_OTHER): Payer: BC Managed Care – PPO | Admitting: Internal Medicine

## 2012-11-01 VITALS — BP 132/84 | HR 75 | Ht 63.0 in | Wt 187.8 lb

## 2012-11-01 DIAGNOSIS — J040 Acute laryngitis: Secondary | ICD-10-CM

## 2012-11-01 MED ORDER — AMOXICILLIN-POT CLAVULANATE 875-125 MG PO TABS: 1.0000 | ORAL_TABLET | Freq: Two times a day (BID) | ORAL | Status: DC

## 2012-11-01 MED ORDER — FLUCONAZOLE 150 MG PO TABS: ORAL_TABLET | ORAL | Status: DC

## 2012-11-01 NOTE — Patient Instructions (Addendum)
Script sent for augmentin   Script to hold for diflucan for yeast in case needed  Try using an otc decongestant like phenylephrine/  Sudafed-PE

## 2012-11-01 NOTE — Progress Notes (Signed)
Patient ID: Misty Curtis, female    DOB: [birth year], 51 y.o.   MRN: [id number]  HPI [name] followed for allergy and bronchitis. Last here October 18, 2010. She did ok during the winter till she went on vacation to [place] then [place] in [place] where she blames their early pollen season for starting retroorbital pressure hoarseness. Sudafed helps some. -PE doesn't help. Sense of smell ok. Denies wheeze. Scant mucus is clear. Denies fever or sore throat. Does not like Net pot. Continues nasonex, claritin and Singulair. Has not needed her rescue inhaler much at all.   10/24/11- [name] followed for allergy and bronchitis, complicated by HBP, lupus.Marland Kitchen PCP Dr Nathanial Rancher She blames dust after raking leaves and seasonal weather change for hoarseness in recent days. Right maxillary sinus pressure. Has to blow her nose out each morning. Episode of chest tightness one week ago quickly responded to a rescue inhaler. Otherwise has not needed to use that inhaler. Denies fever sore throat or nodes. She says she depends on Nasonex, occasional Sudafed, saline rinse. She had an episode when she was chopping help jalapeno peppers- touch her hand with the juice to her lip and rapidly got angioedema of the lip. She knows to avoid that exposure.  04/27/12- [name] followed for allergy and bronchitis, complicated by HBP, lupus.Marland Kitchen PCP Dr Nathanial Rancher Increased laryngitis; ? wheezing episodes(wind was up today);feels tight in chest but not painful She felt well through the winter until spring. Has new heating and air-conditioning duct works in her home which helps. 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 Better temporarily when that unit was bleached. Denies reflux/GERD but does notice postnasal drip and gets wheezy. Using rescue inhaler not more than once per day.  11/01/12- [name] followed for allergy and bronchitis, complicated by HBP, lupus.Marland Kitchen PCP Dr Nathanial Rancher FOLLOWS FOR: pt states that breathing has  improved since last OV but that voice "comes and goes". Having increased hoarseness more so this season than she did in the summer time. Pt would like left ear checked, pain and ear d/c in mandibular area--has eustachian tube. Had flu vaccine. 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 Blames wind and dust outdoors over last 2 weeks for increased hoarseness. She has to be outdoors a lot. Much nasal discharge. Using Dymista nasal spray.  Review of Systems-see HPI Constitutional:   No-   weight loss, night sweats, fevers, chills, fatigue, lassitude. HEENT:   No-  headaches, difficulty swallowing, tooth/dental problems, sore throat,       No-  sneezing, itching, +ear ache, +nasal congestion, +post nasal drip,  CV:  No-   chest pain, orthopnea, PND, swelling in lower extremities, anasarca, dizziness, palpitations Resp: No-   shortness of breath with exertion or at rest.              No-   productive cough,  No non-productive cough,  No- coughing up of blood.              No-   change in color of mucus.  Little wheezing.   Skin: No-   rash or lesions. GI:  No-   heartburn, indigestion, abdominal pain, nausea, vomiting,  GU:  MS:  No-   joint pain or swelling.  Neuro-     nothing unusual Psych:  No- change in mood or affect. No depression or anxiety.  No memory loss.  Objective:   Physical Exam General-  Alert, Oriented, Affect-appropriate, Distress- none acute, obese Skin- rash-none, lesions- none, excoriation- none Lymphadenopathy- none Head- atraumatic            Eyes- Gross vision intact, PERRLA, conjunctivae clear secretions            Ears- Hearing, canals-normal. TMs look normal            Nose- Clear, no-Septal dev, mucus, polyps, erosion, perforation             Throat- Mallampati II , mucosa clear , drainage- none, tonsils- atrophic. No visible postnasal drainage and minimal redness of her pharynx. +Mildly  hoarse Neck- flexible , trachea  midline, no stridor , thyroid nl, carotid no bruit Chest - symmetrical excursion , unlabored           Heart/CV- RRR , no murmur , no gallop  , no rub, nl s1 s2                           - JVD- none , edema- none, stasis changes- none, varices- none           Lung- clear to P&A, wheeze- none, cough- none , dullness-none, rub- none           Chest wall-  Abd-  Br/ Gen/ Rectal- Not done, not indicated Extrem- cyanosis- none, clubbing, none, atrophy- none, strength- nl Neuro- grossly intact to observation

## 2012-11-08 ENCOUNTER — Other Ambulatory Visit: Payer: Self-pay | Admitting: *Deleted

## 2012-11-08 MED ORDER — BUDESONIDE-FORMOTEROL FUMARATE 160-4.5 MCG/ACT IN AERO: 2.0000 | INHALATION_SPRAY | Freq: Two times a day (BID) | RESPIRATORY_TRACT | Status: DC

## 2012-11-10 DIAGNOSIS — J040 Acute laryngitis: Secondary | ICD-10-CM | POA: Insufficient documentation

## 2012-11-10 NOTE — Assessment & Plan Note (Signed)
Viral infection versus irritant. Some associated eustachian dysfunction. Can't exclude an early sinusitis. We will cover it all with Augmentin and Sudafed.

## 2012-12-17 ENCOUNTER — Other Ambulatory Visit: Payer: Self-pay | Admitting: Internal Medicine

## 2012-12-17 MED ORDER — MONTELUKAST SODIUM 10 MG PO TABS: 10.0000 mg | ORAL_TABLET | Freq: Every day | ORAL | Status: DC

## 2013-01-26 ENCOUNTER — Other Ambulatory Visit: Payer: Self-pay | Admitting: Internal Medicine

## 2013-02-12 ENCOUNTER — Telehealth: Payer: Self-pay | Admitting: Internal Medicine

## 2013-02-12 MED ORDER — AMOXICILLIN-POT CLAVULANATE 875-125 MG PO TABS: 1.0000 | ORAL_TABLET | Freq: Two times a day (BID) | ORAL | Status: DC

## 2013-02-12 NOTE — Telephone Encounter (Signed)
I spoke with pt and is aware of CDY recs. RX has been sent. Nothing further was needed 

## 2013-02-12 NOTE — Telephone Encounter (Signed)
Member # [id number].  Dymista approved from 02/11/13 through 02/12/14. Pharmacy notified.

## 2013-02-12 NOTE — Telephone Encounter (Signed)
Per CY-okay to give patient Augmentin 875 mg #14 take 1 po bid no refills.

## 2013-02-12 NOTE — Telephone Encounter (Signed)
Spoke with pt She is c/o sinus pressure, HA, foul taste in mouth, yellow coating on her tongue, low grade temp at HS only and hoarseness x 1 wk She denies sore throat, cough, wheezing, CP, chest tightness, SOB or other co's  Has tried vicks vapor rub and sudafed with no relief Please advise thanks! Last ov 11/01/12 Next ov 04/22/13 Allergies  Allergen Reactions  . Latex   . Neomycin-Bacitracin Zn-Polymyx     REACTION: rash

## 2013-05-02 ENCOUNTER — Ambulatory Visit (INDEPENDENT_AMBULATORY_CARE_PROVIDER_SITE_OTHER): Payer: BC Managed Care – PPO | Admitting: Internal Medicine

## 2013-05-02 ENCOUNTER — Encounter: Payer: Self-pay | Admitting: Internal Medicine

## 2013-05-02 VITALS — BP 110/80 | HR 79 | Ht 63.0 in | Wt 182.8 lb

## 2013-05-02 DIAGNOSIS — J019 Acute sinusitis, unspecified: Secondary | ICD-10-CM

## 2013-05-02 MED ORDER — AMOXICILLIN-POT CLAVULANATE 875-125 MG PO TABS: 1.0000 | ORAL_TABLET | Freq: Two times a day (BID) | ORAL | Status: DC

## 2013-05-02 MED ORDER — CLOTRIMAZOLE-BETAMETHASONE 1-0.05 % EX CREA: TOPICAL_CREAM | Freq: Two times a day (BID) | CUTANEOUS | Status: DC

## 2013-05-02 MED ORDER — MUPIROCIN 2 % EX OINT: 1.0000 "application " | TOPICAL_OINTMENT | Freq: Three times a day (TID) | CUTANEOUS | Status: DC

## 2013-05-02 MED ORDER — AZELASTINE-FLUTICASONE 137-50 MCG/ACT NA SUSP: 1.0000 | Freq: Every day | NASAL | Status: DC

## 2013-05-02 NOTE — Patient Instructions (Addendum)
Scripts for [employer], Dymista, lotrisone cream, augmentin  Please call as needed

## 2013-05-02 NOTE — Progress Notes (Signed)
Patient ID: Misty Curtis, female    DOB: 1961/01/11, 52 y.o.   MRN: [id number]  HPI [name] followed for allergy and bronchitis. Last here October 18, 2010. She did ok during the winter till she went on vacation to [place] then [place] in [place] where she blames their early pollen season for starting retroorbital pressure hoarseness. Sudafed helps some. -PE doesn't help. Sense of smell ok. Denies wheeze. Scant mucus is clear. Denies fever or sore throat. Does not like Net pot. Continues nasonex, claritin and Singulair. Has not needed her rescue inhaler much at all.   10/24/11- [name] followed for allergy and bronchitis, complicated by HBP, lupus.Marland Kitchen PCP Dr Misty Curtis She blames dust after raking leaves and seasonal weather change for hoarseness in recent days. Right maxillary sinus pressure. Has to blow her nose out each morning. Episode of chest tightness one week ago quickly responded to a rescue inhaler. Otherwise has not needed to use that inhaler. Denies fever sore throat or nodes. She says she depends on Nasonex, occasional Sudafed, saline rinse. She had an episode when she was chopping help jalapeno peppers- touch her hand with the juice to her lip and rapidly got angioedema of the lip. She knows to avoid that exposure.  04/27/12- [name] followed for allergy and bronchitis, complicated by HBP, lupus.Marland Kitchen PCP Dr Misty Curtis Increased laryngitis; ? wheezing episodes(wind was up today);feels tight in chest but not painful She felt well through the winter until spring. Has new heating and air-conditioning duct works in her home which helps. 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 Better temporarily when that unit was bleached. Denies reflux/GERD but does notice postnasal drip and gets wheezy. Using rescue inhaler not more than once per day.  11/01/12- [name] followed for allergy and bronchitis, complicated by HBP, lupus.Marland Kitchen PCP Dr Misty Curtis FOLLOWS FOR: pt states that breathing has  improved since last OV but that voice "comes and goes". Having increased hoarseness more so this season than she did in the summer time. Pt would like left ear checked, pain and ear d/c in mandibular area--has eustachian tube. Had flu vaccine. 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 Blames wind and dust outdoors over last 2 weeks for increased hoarseness. She has to be outdoors a lot. Much nasal discharge. Using Dymista nasal spray.  05/02/13- [name] followed for allergy and bronchitis, complicated by HBP, lupus.Marland Kitchen PCP Dr Misty Curtis FOLLOWS FOR: breathing has been doing okay; head congestion. voice has been fine until this week. ? Thrush on tongue-yellow colored as well. Recently blames pollen for head congestion, sinus drainage, hoarseness, yellow coating on tongue. Denies headache, sore throat, fever, chest congestion or cough.  Review of Systems-see HPI Constitutional:   No-   weight loss, night sweats, fevers, chills, fatigue, lassitude. HEENT:   No-  headaches, difficulty swallowing, tooth/dental problems, sore throat,       No-  sneezing, itching, +ear ache, +nasal congestion, +post nasal drip,  CV:  No-   chest pain, orthopnea, PND, swelling in lower extremities, anasarca, dizziness, palpitations Resp: No-   shortness of breath with exertion or at rest.              No-   productive cough,  No non-productive cough,  No- coughing up of blood.              No-   change in color of mucus.  Little wheezing.  Skin: No-   rash or lesions. GI:  No-   heartburn, indigestion, abdominal pain, nausea, vomiting,  GU:  MS:  No-   joint pain or swelling.  Neuro-     nothing unusual Psych:  No- change in mood or affect. No depression or anxiety.  No memory loss.  Objective:   Physical Exam General- Alert, Oriented, Affect-appropriate, Distress- none acute, overweight Skin- rash-none, lesions- none, excoriation- none Lymphadenopathy- none Head- atraumatic             Eyes- Gross vision intact, PERRLA, conjunctivae clear secretions            Ears- Hearing, canals-normal. TMs look normal            Nose- Clear, no-Septal dev, mucus, polyps, erosion, perforation             Throat- Mallampati II , mucosa +tongue coated yellow brown , drainage- none, tonsils-                       atrophic. No visible postnasal drainage and minimal redness of her pharynx.                  +Mildly hoarse Neck- flexible , trachea midline, no stridor , thyroid nl, carotid no bruit Chest - symmetrical excursion , unlabored           Heart/CV- RRR , no murmur , no gallop  , no rub, nl s1 s2                           - JVD- none , edema- none, stasis changes- none, varices- none           Lung- clear to P&A, wheeze- none, cough- none , dullness-none, rub- none           Chest wall-  Abd-  Br/ Gen/ Rectal- Not done, not indicated Extrem- cyanosis- none, clubbing, none, atrophy- none, strength- nl Neuro- grossly intact to observation

## 2013-05-05 ENCOUNTER — Encounter: Payer: Self-pay | Admitting: Internal Medicine

## 2013-05-11 NOTE — Assessment & Plan Note (Addendum)
Acute upper respiratory infection versus pollen rhinitis. Plan-try Dymista nasal spray, Bactroban for nasal mucosa, Lotrisone cream, Augmentin

## 2013-09-06 ENCOUNTER — Other Ambulatory Visit: Payer: Self-pay | Admitting: Internal Medicine

## 2013-10-06 ENCOUNTER — Other Ambulatory Visit: Payer: Self-pay | Admitting: Internal Medicine

## 2013-10-24 ENCOUNTER — Other Ambulatory Visit: Payer: Self-pay

## 2013-10-29 ENCOUNTER — Encounter: Payer: Self-pay | Admitting: Internal Medicine

## 2013-10-29 ENCOUNTER — Ambulatory Visit (INDEPENDENT_AMBULATORY_CARE_PROVIDER_SITE_OTHER): Payer: BC Managed Care – PPO | Admitting: Internal Medicine

## 2013-10-29 VITALS — BP 140/84 | HR 79 | Ht 63.0 in | Wt 184.0 lb

## 2013-10-29 DIAGNOSIS — J40 Bronchitis, not specified as acute or chronic: Secondary | ICD-10-CM

## 2013-10-29 MED ORDER — ALBUTEROL SULFATE HFA 108 (90 BASE) MCG/ACT IN AERS: 2.0000 | INHALATION_SPRAY | Freq: Four times a day (QID) | RESPIRATORY_TRACT | Status: DC | PRN

## 2013-10-29 NOTE — Patient Instructions (Addendum)
Refill script albuterol HFA rescue inhaler  We can continue routine meds. Please call as needed

## 2013-10-29 NOTE — Progress Notes (Signed)
Patient ID: Misty Curtis, female    DOB: [birth year], 52 y.o.   MRN: [id number]  HPI 58 yoF followed for allergy and bronchitis. Last here October 18, 2010. She did ok during the winter till she went on vacation to [place] then [place] in [place] where she blames their early pollen season for starting retroorbital pressure hoarseness. Sudafed helps some. -PE doesn't help. Sense of smell ok. Denies wheeze. Scant mucus is clear. Denies fever or sore throat. Does not like Net pot. Continues nasonex, claritin and Singulair. Has not needed her rescue inhaler much at all.   10/24/11- [name] followed for allergy and bronchitis, complicated by HBP, lupus.Marland Kitchen PCP Dr Nathanial Rancher She blames dust after raking leaves and seasonal weather change for hoarseness in recent days. Right maxillary sinus pressure. Has to blow her nose out each morning. Episode of chest tightness one week ago quickly responded to a rescue inhaler. Otherwise has not needed to use that inhaler. Denies fever sore throat or nodes. She says she depends on Nasonex, occasional Sudafed, saline rinse. She had an episode when she was chopping help jalapeno peppers- touch her hand with the juice to her lip and rapidly got angioedema of the lip. She knows to avoid that exposure.  04/27/12- [name] followed for allergy and bronchitis, complicated by HBP, lupus.Marland Kitchen PCP Dr Nathanial Rancher Increased laryngitis; ? wheezing episodes(wind was up today);feels tight in chest but not painful She felt well through the winter until spring. Has new heating and air-conditioning duct works in her home which helps. 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 Better temporarily when that unit was bleached. Denies reflux/GERD but does notice postnasal drip and gets wheezy. Using rescue inhaler not more than once per day.  11/01/12- [name] followed for allergy and bronchitis, complicated by HBP, lupus.Marland Kitchen PCP Dr Nathanial Rancher FOLLOWS FOR: pt states that breathing has  improved since last OV but that voice "comes and goes". Having increased hoarseness more so this season than she did in the summer time. Pt would like left ear checked, pain and ear d/c in mandibular area--has eustachian tube. Had flu vaccine. 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 Blames wind and dust outdoors over last 2 weeks for increased hoarseness. She has to be outdoors a lot. Much nasal discharge. Using Dymista nasal spray.  05/02/13- [name] followed for allergy and bronchitis, complicated by HBP, lupus.Marland Kitchen PCP Dr Nathanial Rancher FOLLOWS FOR: breathing has been doing okay; head congestion. voice has been fine until this week. ? Thrush on tongue-yellow colored as well. Recently blames pollen for head congestion, sinus drainage, hoarseness, yellow coating on tongue. Denies headache, sore throat, fever, chest congestion or cough.  10/29/13- 51 yoF followed for allergy and bronchitis, complicated by HBP, lupus.Marland Kitchen PCP Dr Nathanial Rancher FOLLOWS FOR: Breathing is unchanged. Reports chest tightness from time to time, chest congestion. Denies wheezing, SOB or coughing.  Has had flu vaccine  ran out of her rescue inhaler. Incidental rash right bicep resolving with itch.  Review of Systems-see HPI Constitutional:   No-   weight loss, night sweats, fevers, chills, fatigue, lassitude. HEENT:   No-  headaches, difficulty swallowing, tooth/dental problems, sore throat,       No-  sneezing, itching, +ear ache, +nasal congestion, +post nasal drip,  CV:  No-   chest pain, orthopnea, PND, swelling in lower extremities, anasarca, dizziness, palpitations Resp: No-   shortness of breath with exertion or at rest.  No-   productive cough,  No non-productive cough,  No- coughing up of blood.              No-   change in color of mucus.  Little wheezing.   Skin: +rash or lesions. GI:  No-   heartburn, indigestion, abdominal pain, nausea, vomiting,  GU:  MS:  No-   joint  pain or swelling.  Neuro-     nothing unusual Psych:  No- change in mood or affect. No depression or anxiety.  No memory loss.  Objective:   Physical Exam General- Alert, Oriented, Affect-appropriate, Distress- none acute, overweight Skin- rash-none, lesions- none, excoriation- none Lymphadenopathy- none Head- atraumatic            Eyes- Gross vision intact, PERRLA, conjunctivae clear secretions            Ears- Hearing, canals-normal. TMs look normal            Nose- Clear, no-Septal dev, mucus, polyps, erosion, perforation             Throat- Mallampati II , mucosa +tongue coated yellow brown , drainage- none, tonsils-                       atrophic. No visible postnasal drainage and minimal redness of her pharynx.                  +Mildly hoarse Neck- flexible , trachea midline, no stridor , thyroid nl, carotid no bruit Chest - symmetrical excursion , unlabored           Heart/CV- RRR , no murmur , no gallop  , no rub, nl s1 s2                           - JVD- none , edema- none, stasis changes- none, varices- none           Lung- clear to P&A, wheeze- none, cough- none , dullness-none, rub- none           Chest wall-  Abd-  Br/ Gen/ Rectal- Not done, not indicated Extrem- cyanosis- none, clubbing, none, atrophy- none, strength- nl Neuro- grossly intact to observation

## 2013-11-07 ENCOUNTER — Other Ambulatory Visit: Payer: Self-pay | Admitting: Internal Medicine

## 2013-11-12 ENCOUNTER — Other Ambulatory Visit: Payer: Self-pay | Admitting: Internal Medicine

## 2013-11-14 NOTE — Assessment & Plan Note (Signed)
Minor sense of chest tightness occasionally may be a very mild asthmatic bronchitis Plan-refill rescue inhaler

## 2013-12-10 ENCOUNTER — Other Ambulatory Visit: Payer: Self-pay | Admitting: Internal Medicine

## 2013-12-30 ENCOUNTER — Telehealth: Payer: Self-pay | Admitting: Internal Medicine

## 2013-12-30 MED ORDER — AMOXICILLIN-POT CLAVULANATE 875-125 MG PO TABS: 1.0000 | ORAL_TABLET | Freq: Two times a day (BID) | ORAL | Status: DC

## 2013-12-30 NOTE — Telephone Encounter (Signed)
Per CDY: okay for Augmentin 875mg , #14  1 po BID, no refills.  Called, left detailed message on voicemail as directed below informing pt of rx being sent to [facility] in [place].  Advised pt to call if symptoms do not improve or worsen.    Rx sent Will sign off.

## 2013-12-30 NOTE — Telephone Encounter (Signed)
I called and spoke with pt. She c/o sinus infection. C/o no taste, off and on fever-highest 101, PND, nasal congestion, sinus pressure, HA, teeth hurting, tongue yellow, runny nose, prod cough w/ yellow phlem x 2weeks. Doing peroxide and mouth wash rinses. Has taken several OTC medications. Pt asking for recs. Please advise Dr. Annamaria Boots thanks --leave detailed message if she does not answer --[facility]  Allergies  Allergen Reactions  . Latex   . Neomycin-Bacitracin Zn-Polymyx     REACTION: rash    Current Outpatient Prescriptions on File Prior to Visit  Medication Sig Dispense Refill  . albuterol (VENTOLIN HFA) 108 (90 BASE) MCG/ACT inhaler Inhale 2 puffs into the lungs every 6 (six) hours as needed.  1 Inhaler  prn  . Ascorbic Acid (VITAMIN C) 500 MG tablet Take 500 mg by mouth daily.        . Azelastine-Fluticasone (DYMISTA) 137-50 MCG/ACT SUSP Place 1 puff into the nose at bedtime.  1 Bottle  prn  . B Complex Vitamins (VITAMIN B COMPLEX PO) Take 1 tablet by mouth daily.        . B-D TB SYRINGE 1CC/27GX1/2" 27G X 1/2" 1 ML MISC Use with Methotrexate Injections      . BIOTENE DRY MOUTH (BIOTENE) LIQD Place 1 application onto teeth as needed.        . cholecalciferol (VITAMIN D) 1000 UNITS tablet Take 2,000 Units by mouth. 6 days weekly      . clotrimazole-betamethasone (LOTRISONE) cream Apply topically 2 (two) times daily.  30 g  prn  . folic acid (FOLVITE) 1 MG tablet Take 2 tablets by mouth daily with supper.      . hydroxychloroquine (PLAQUENIL) 200 MG tablet Take 200 mg by mouth 2 (two) times daily.        Marland Kitchen levothyroxine (SYNTHROID, LEVOTHROID) 50 MCG tablet Take 50 mcg by mouth daily.        Marland Kitchen loratadine (CLARITIN) 10 MG tablet Take 10 mg by mouth daily.        . methotrexate 25 MG/ML SOLN Inject 0.7 mg into the muscle once a week. On Friday mornings.      . montelukast (SINGULAIR) 10 MG tablet TAKE 1 TABLET BY MOUTH EVERY NIGHT AT BEDTIME  30 tablet  0  . mupirocin ointment  (BACTROBAN) 2 % Apply 1 application topically 3 (three) times daily.  22 g  prn  . nabumetone (RELAFEN) 750 MG tablet Take 750 mg by mouth 2 (two) times daily as needed.       Marland Kitchen NUVIGIL 250 MG tablet Take 0.5 tablets by mouth Every morning.      . Probiotic Product (ALIGN) 4 MG CAPS Take 1 capsule by mouth daily.        Marland Kitchen Specialty Vitamins Products (CENTRUM PERFORMANCE) TABS Take 1 tablet by mouth daily.        . SYMBICORT 160-4.5 MCG/ACT inhaler INHALE TWO PUFFS BY MOUTH TWICE DAILY. RINSE MOUTH AFTER USE  10.2 g  0  . Vitamin D, Ergocalciferol, (DRISDOL) 50000 UNITS CAPS Take 1 tablet by mouth Once a week.       No current facility-administered medications on file prior to visit.

## 2014-01-14 ENCOUNTER — Other Ambulatory Visit: Payer: Self-pay | Admitting: Internal Medicine

## 2014-01-29 ENCOUNTER — Other Ambulatory Visit: Payer: Self-pay | Admitting: Internal Medicine

## 2014-02-11 ENCOUNTER — Other Ambulatory Visit: Payer: Self-pay | Admitting: Internal Medicine

## 2014-03-16 ENCOUNTER — Other Ambulatory Visit: Payer: Self-pay | Admitting: Internal Medicine

## 2014-03-18 ENCOUNTER — Telehealth: Payer: Self-pay | Admitting: Internal Medicine

## 2014-03-18 NOTE — Telephone Encounter (Signed)
I have not been seeing any communication about PA for Dymista  Recommend separate scripts for the 2 components to use together: Astelin nasal spray #1, 1-2 puffs once or twice daily Flonase/fluticasone nasal  # 1, 1-2 puffs each nostril once daily  Refill both prn

## 2014-03-18 NOTE — Telephone Encounter (Signed)
Pt last seen 10/29/13 Pending 10/29/14 Do you want [place] to initiate PA for dymista or call in the 2 separate medications that make up this medication. Please advise CDY thanks

## 2014-03-18 NOTE — Telephone Encounter (Signed)
lmomtcb x1 for pt 

## 2014-03-19 NOTE — Telephone Encounter (Signed)
LMTC x 1  

## 2014-03-20 NOTE — Telephone Encounter (Signed)
LMTCB

## 2014-03-24 MED ORDER — FLUTICASONE PROPIONATE 50 MCG/ACT NA SUSP: NASAL | Status: DC

## 2014-03-24 MED ORDER — AZELASTINE HCL 0.1 % NA SOLN: NASAL | Status: DC

## 2014-03-24 NOTE — Telephone Encounter (Signed)
Pt has returned call. She also states she thinks she has been bitten by spider. What to do?

## 2014-03-24 NOTE — Telephone Encounter (Signed)
Spoke with the pt and notified of recs per CDY re astelin and flonase Rxs were sent to pharm  Pt verbalized understanding  She is aware to call her PCP re spider bite on her elbow  Nothing further needed

## 2014-04-14 ENCOUNTER — Other Ambulatory Visit: Payer: Self-pay | Admitting: Internal Medicine

## 2014-04-23 ENCOUNTER — Ambulatory Visit: Payer: BC Managed Care – PPO | Admitting: Emergency Medicine

## 2014-09-03 ENCOUNTER — Other Ambulatory Visit: Payer: Self-pay | Admitting: Internal Medicine

## 2014-10-01 ENCOUNTER — Other Ambulatory Visit: Payer: Self-pay | Admitting: Internal Medicine

## 2014-10-22 ENCOUNTER — Encounter: Payer: Self-pay | Admitting: Internal Medicine

## 2014-10-29 ENCOUNTER — Ambulatory Visit: Payer: BC Managed Care – PPO | Admitting: Internal Medicine

## 2014-11-03 ENCOUNTER — Other Ambulatory Visit: Payer: Self-pay | Admitting: Internal Medicine

## 2014-12-09 ENCOUNTER — Other Ambulatory Visit: Payer: Self-pay | Admitting: Internal Medicine

## 2015-01-06 ENCOUNTER — Other Ambulatory Visit: Payer: Self-pay | Admitting: Internal Medicine

## 2015-01-06 ENCOUNTER — Encounter: Payer: Self-pay | Admitting: Internal Medicine

## 2015-01-06 ENCOUNTER — Ambulatory Visit (INDEPENDENT_AMBULATORY_CARE_PROVIDER_SITE_OTHER): Payer: BLUE CROSS/BLUE SHIELD | Admitting: Internal Medicine

## 2015-01-06 VITALS — BP 118/76 | HR 104 | Ht 63.0 in | Wt 188.0 lb

## 2015-01-06 DIAGNOSIS — J3089 Other allergic rhinitis: Principal | ICD-10-CM

## 2015-01-06 DIAGNOSIS — J309 Allergic rhinitis, unspecified: Secondary | ICD-10-CM

## 2015-01-06 DIAGNOSIS — J302 Other seasonal allergic rhinitis: Secondary | ICD-10-CM

## 2015-01-06 NOTE — Patient Instructions (Signed)
You can try off nasal sprays for comparison  You can try Flonase alone  You can try otc Nasalcrom/ Cromol/ cromolyn nasal spray, either by itself, or in addition to Flonase.

## 2015-01-06 NOTE — Progress Notes (Signed)
Patient ID: Misty Curtis, female    DOB: 10/20/1961, 54 y.o.   MRN: [id number]  HPI 27 yoF followed for allergy and bronchitis. Last here October 18, 2010. She did ok during the winter till she went on vacation to [place] then [place] in [place] where she blames their early pollen season for starting retroorbital pressure hoarseness. Sudafed helps some. -PE doesn't help. Sense of smell ok. Denies wheeze. Scant mucus is clear. Denies fever or sore throat. Does not like Net pot. Continues nasonex, claritin and Singulair. Has not needed her rescue inhaler much at all.   10/24/11- 61 yoF followed for allergy and bronchitis, complicated by HBP, lupus.Marland Kitchen PCP Dr Lisbeth Ply She blames dust after raking leaves and seasonal weather change for hoarseness in recent days. Right maxillary sinus pressure. Has to blow her nose out each morning. Episode of chest tightness one week ago quickly responded to a rescue inhaler. Otherwise has not needed to use that inhaler. Denies fever sore throat or nodes. She says she depends on Nasonex, occasional Sudafed, saline rinse. She had an episode when she was chopping help jalapeno peppers- touch her hand with the juice to her lip and rapidly got angioedema of the lip. She knows to avoid that exposure.  04/27/12- 63 yoF followed for allergy and bronchitis, complicated by HBP, lupus.Marland Kitchen PCP Dr Lisbeth Ply Increased laryngitis; ? wheezing episodes(wind was up today);feels tight in chest but not painful She felt well through the winter until spring. Has new heating and air-conditioning duct works in her home which helps. 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 Better temporarily when that unit was bleached. Denies reflux/GERD but does notice postnasal drip and gets wheezy. Using rescue inhaler not more than once per day.  11/01/12- 59 yoF followed for allergy and bronchitis, complicated by HBP, lupus.Marland Kitchen PCP Dr Lisbeth Ply FOLLOWS FOR: pt states that breathing has  improved since last OV but that voice "comes and goes". Having increased hoarseness more so this season than she did in the summer time. Pt would like left ear checked, pain and ear d/c in mandibular area--has eustachian tube. Had flu vaccine. 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 Blames wind and dust outdoors over last 2 weeks for increased hoarseness. She has to be outdoors a lot. Much nasal discharge. Using Dymista nasal spray.  05/02/13- 50 yoF followed for allergy and bronchitis, complicated by HBP, lupus.Marland Kitchen PCP Dr Lisbeth Ply FOLLOWS FOR: breathing has been doing okay; head congestion. voice has been fine until this week. ? Thrush on tongue-yellow colored as well. Recently blames pollen for head congestion, sinus drainage, hoarseness, yellow coating on tongue. Denies headache, sore throat, fever, chest congestion or cough.  10/29/13- 65 yoF followed for allergy and bronchitis, complicated by HBP, lupus.Marland Kitchen PCP Dr Lisbeth Ply FOLLOWS FOR: Breathing is unchanged. Reports chest tightness from time to time, chest congestion. Denies wheezing, SOB or coughing.  Has had flu vaccine  ran out of her rescue inhaler. Incidental rash right bicep resolving with itch.  01/06/15- 20 yoF followed for allergy and bronchitis, complicated by HBP, lupus.Marland Kitchen PCP Dr Lisbeth Ply FOLLOW FOR:  nose stuffy; discuss nasal sprays, having complications with sprays, bad taste and causes her to vomit sometimes Dislikes the taste of azelastine nasal spray. She has continued using this with Flonase and has not tried recently without any nasal spray. Denies chest tightness wheeze or cough.  Review of Systems-see HPI Constitutional:   No-   weight  loss, night sweats, fevers, chills, fatigue, lassitude. HEENT:   No-  headaches, difficulty swallowing, tooth/dental problems, sore throat,       No-  sneezing, itching, +ear ache, +nasal congestion, +post nasal drip,  CV:  No-   chest pain, orthopnea,  PND, swelling in lower extremities, anasarca, dizziness, palpitations Resp: No-   shortness of breath with exertion or at rest.              No-   productive cough,  No non-productive cough,  No- coughing up of blood.              No-   change in color of mucus.  Little wheezing.   Skin: +rash or lesions. GI:  No-   heartburn, indigestion, abdominal pain, nausea, vomiting,  GU:  MS:  No-   joint pain or swelling.  Neuro-     nothing unusual Psych:  No- change in mood or affect. No depression or anxiety.  No memory loss.  Objective:   Physical Exam General- Alert, Oriented, Affect-appropriate, Distress- none acute, overweight Skin- rash-none, lesions- none, excoriation- none Lymphadenopathy- none Head- atraumatic            Eyes- Gross vision intact, PERRLA, conjunctivae clear secretions            Ears- Hearing, canals-normal. TMs look normal            Nose- + mild turbinate edema, no-Septal dev, mucus, polyps, erosion, perforation             Throat- Mallampati II , mucosa -clear, drainage- none, tonsils-                       atrophic. No visible postnasal drainage and minimal redness of her pharynx.                  +Mildly hoarse Neck- flexible , trachea midline, no stridor , thyroid nl, carotid no bruit Chest - symmetrical excursion , unlabored           Heart/CV- RRR , no murmur , no gallop  , no rub, nl s1 s2                           - JVD- none , edema- none, stasis changes- none, varices- none           Lung- clear to P&A, wheeze- none, cough- none , dullness-none, rub- none           Chest wall-  Abd-  Br/ Gen/ Rectal- Not done, not indicated Extrem- cyanosis- none, clubbing, none, atrophy- none, strength- nl Neuro- grossly intact to observation

## 2015-01-06 NOTE — Assessment & Plan Note (Signed)
On methotrexate injections. Impact on rhinitis is not obvious. Discussed allergic and nonallergic/vasomotor rhinitis in this cold weather. She does not like azelastine but has been using it regularly. Plan-try off both nasal sprays to see if she needs them. Then she can try Flonase alone and consider trying Nasalcrom as alternative to azelastine

## 2015-01-31 ENCOUNTER — Other Ambulatory Visit: Payer: Self-pay | Admitting: Internal Medicine

## 2015-03-03 ENCOUNTER — Other Ambulatory Visit: Payer: Self-pay | Admitting: Pulmonary Disease

## 2015-03-16 ENCOUNTER — Other Ambulatory Visit: Payer: Self-pay | Admitting: Internal Medicine

## 2015-03-31 ENCOUNTER — Other Ambulatory Visit: Payer: Self-pay | Admitting: Internal Medicine

## 2015-05-04 ENCOUNTER — Other Ambulatory Visit: Payer: Self-pay | Admitting: Internal Medicine

## 2015-06-15 ENCOUNTER — Other Ambulatory Visit: Payer: Self-pay | Admitting: Internal Medicine

## 2015-06-28 ENCOUNTER — Other Ambulatory Visit: Payer: Self-pay | Admitting: Internal Medicine

## 2015-08-10 ENCOUNTER — Other Ambulatory Visit: Payer: Self-pay | Admitting: Internal Medicine

## 2015-09-07 ENCOUNTER — Other Ambulatory Visit: Payer: Self-pay | Admitting: Internal Medicine

## 2015-12-29 ENCOUNTER — Other Ambulatory Visit: Payer: Self-pay | Admitting: Internal Medicine

## 2016-01-02 ENCOUNTER — Other Ambulatory Visit: Payer: Self-pay | Admitting: Internal Medicine

## 2016-01-11 ENCOUNTER — Ambulatory Visit (INDEPENDENT_AMBULATORY_CARE_PROVIDER_SITE_OTHER): Payer: BC Managed Care – PPO | Admitting: Internal Medicine

## 2016-01-11 ENCOUNTER — Encounter: Payer: Self-pay | Admitting: Internal Medicine

## 2016-01-11 VITALS — BP 114/66 | HR 84 | Ht 63.0 in | Wt 188.2 lb

## 2016-01-11 DIAGNOSIS — J3089 Other allergic rhinitis: Secondary | ICD-10-CM

## 2016-01-11 DIAGNOSIS — J309 Allergic rhinitis, unspecified: Secondary | ICD-10-CM | POA: Diagnosis not present

## 2016-01-11 DIAGNOSIS — J40 Bronchitis, not specified as acute or chronic: Secondary | ICD-10-CM

## 2016-01-11 DIAGNOSIS — J302 Other seasonal allergic rhinitis: Secondary | ICD-10-CM

## 2016-01-11 NOTE — Patient Instructions (Signed)
Please call if we can help 

## 2016-01-11 NOTE — Progress Notes (Signed)
Patient ID: Misty Curtis, female    DOB: Jun 24, 1961, 55 y.o.   MRN: [id number]  HPI 54 yoF followed for allergy and bronchitis. Last here October 18, 2010. She did ok during the winter till she went on vacation to [place] then [place] in [place] where she blames their early pollen season for starting retroorbital pressure hoarseness. Sudafed helps some. -PE doesn't help. Sense of smell ok. Denies wheeze. Scant mucus is clear. Denies fever or sore throat. Does not like Net pot. Continues nasonex, claritin and Singulair. Has not needed her rescue inhaler much at all.   10/24/11- 33 yoF followed for allergy and bronchitis, complicated by HBP, lupus.Marland Kitchen PCP Dr Lisbeth Ply She blames dust after raking leaves and seasonal weather change for hoarseness in recent days. Right maxillary sinus pressure. Has to blow her nose out each morning. Episode of chest tightness one week ago quickly responded to a rescue inhaler. Otherwise has not needed to use that inhaler. Denies fever sore throat or nodes. She says she depends on Nasonex, occasional Sudafed, saline rinse. She had an episode when she was chopping help jalapeno peppers- touch her hand with the juice to her lip and rapidly got angioedema of the lip. She knows to avoid that exposure.  04/27/12- 25 yoF followed for allergy and bronchitis, complicated by HBP, lupus.Marland Kitchen PCP Dr Lisbeth Ply Increased laryngitis; ? wheezing episodes(wind was up today);feels tight in chest but not painful She felt well through the winter until spring. Has new heating and air-conditioning duct works in her home which helps. 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 Better temporarily when that unit was bleached. Denies reflux/GERD but does notice postnasal drip and gets wheezy. Using rescue inhaler not more than once per day.  11/01/12- 74 yoF followed for allergy and bronchitis, complicated by HBP, lupus.Marland Kitchen PCP Dr Lisbeth Ply FOLLOWS FOR: pt states that breathing has  improved since last OV but that voice "comes and goes". Having increased hoarseness more so this season than she did in the summer time. Pt would like left ear checked, pain and ear d/c in mandibular area--has eustachian tube. Had flu vaccine. 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 Blames wind and dust outdoors over last 2 weeks for increased hoarseness. She has to be outdoors a lot. Much nasal discharge. Using Dymista nasal spray.  05/02/13- 24 yoF followed for allergy and bronchitis, complicated by HBP, lupus.Marland Kitchen PCP Dr Lisbeth Ply FOLLOWS FOR: breathing has been doing okay; head congestion. voice has been fine until this week. ? Thrush on tongue-yellow colored as well. Recently blames pollen for head congestion, sinus drainage, hoarseness, yellow coating on tongue. Denies headache, sore throat, fever, chest congestion or cough.  10/29/13- 66 yoF followed for allergy and bronchitis, complicated by HBP, lupus.Marland Kitchen PCP Dr Lisbeth Ply FOLLOWS FOR: Breathing is unchanged. Reports chest tightness from time to time, chest congestion. Denies wheezing, SOB or coughing.  Has had flu vaccine  ran out of her rescue inhaler. Incidental rash right bicep resolving with itch.  01/06/15- 68 yoF followed for allergy and bronchitis, complicated by HBP, lupus.Marland Kitchen PCP Dr Lisbeth Ply FOLLOW FOR:  nose stuffy; discuss nasal sprays, having complications with sprays, bad taste and causes her to vomit sometimes Dislikes the taste of azelastine nasal spray. She has continued using this with Flonase and has not tried recently without any nasal spray. Denies chest tightness wheeze or cough.  01/11/2016-55 year old female never smoker followed for allergy, bronchitis, complicated by HBP,  lupus FOLLOWS FOR:Pt unable to get over "bronchitis  flare" that she caught Dec 2016; other than that usual allergies bothering her. Residual head and chest congestion with scant clear mucus, little wheezing. Using  azelastine and fluticasone nasal sprays Occasional Sudafed PE and Robitussin with saline rinse.  Review of Systems-see HPI Constitutional:   No-   weight loss, night sweats, fevers, chills, fatigue, lassitude. HEENT:   No-  headaches, difficulty swallowing, tooth/dental problems, sore throat,       No-  sneezing, itching, +ear ache, +nasal congestion, +post nasal drip,  CV:  No-   chest pain, orthopnea, PND, swelling in lower extremities, anasarca, dizziness, palpitations Resp: No-   shortness of breath with exertion or at rest.              No-   productive cough,  No non-productive cough,  No- coughing up of blood.              No-   change in color of mucus.  Little wheezing.   Skin: +rash or lesions. GI:  No-   heartburn, indigestion, abdominal pain, nausea, vomiting,  GU:  MS:  No-   joint pain or swelling.  Neuro-     nothing unusual Psych:  No- change in mood or affect. No depression or anxiety.  No memory loss.  Objective:   Physical Exam General- Alert, Oriented, Affect-appropriate, Distress- none acute, + overweight Skin- rash-none, lesions- none, excoriation- none Lymphadenopathy- none Head- atraumatic            Eyes- Gross vision intact, PERRLA, conjunctivae clear secretions            Ears- Hearing, canals-normal. TMs look normal            Nose- + mild turbinate edema, no-Septal dev, mucus, polyps, erosion, perforation             Throat- Mallampati II , mucosa + tongue slightly coated, drainage- none, tonsils- atrophic.                    No visible postnasal drainage and minimal redness of her pharynx.                  +Mildly hoarse Neck- flexible , trachea midline, no stridor , thyroid nl, carotid no bruit Chest - symmetrical excursion , unlabored           Heart/CV- RRR , no murmur , no gallop  , no rub, nl s1 s2                           - JVD- none , edema- none, stasis changes- none, varices- none           Lung- clear to P&A, wheeze- none, cough- none ,  dullness-none, rub- none           Chest wall-  Abd-  Br/ Gen/ Rectal- Not done, not indicated Extrem- cyanosis- none, clubbing, none, atrophy- none, strength- nl Neuro- grossly intact to observation

## 2016-01-17 NOTE — Assessment & Plan Note (Signed)
I think she is resolving an upper respiratory infection now. Meds are adequate. Antibiotic not needed.

## 2016-01-17 NOTE — Assessment & Plan Note (Signed)
Acute bronchitis with upper respiratory infection, slow to resolve. She doesn't think she needs any additional treatment, just time.

## 2016-01-18 ENCOUNTER — Telehealth: Payer: Self-pay | Admitting: *Deleted

## 2016-01-18 NOTE — Telephone Encounter (Signed)
Initiated PA for Symbicort thru [facility]. Key: [id number] Will await response.  [phone]  (f) [id number]

## 2016-01-22 NOTE — Telephone Encounter (Signed)
LMTCB x 1 

## 2016-01-22 NOTE — Telephone Encounter (Signed)
Rep was just here, so we may have the Symbicort coupon.  Patient can also check her insurance formulary to see what is covered instead of Symbicort.

## 2016-01-22 NOTE — Telephone Encounter (Signed)
Symbicort was denied through pt's insurance. They gave no alternatives. I do know with commercial insurance this year there is the $0 copay card for Symbicort.

## 2016-01-24 ENCOUNTER — Other Ambulatory Visit: Payer: Self-pay | Admitting: Internal Medicine

## 2016-01-27 ENCOUNTER — Other Ambulatory Visit: Payer: Self-pay | Admitting: Internal Medicine

## 2016-02-10 NOTE — Telephone Encounter (Signed)
LMTCB to f/u with pt. In regards to below

## 2016-02-11 MED ORDER — BUDESONIDE-FORMOTEROL FUMARATE 160-4.5 MCG/ACT IN AERO: INHALATION_SPRAY | RESPIRATORY_TRACT | Status: DC

## 2016-02-11 NOTE — Telephone Encounter (Signed)
lmtcb for pt.  

## 2016-02-11 NOTE — Telephone Encounter (Signed)
Left message for patient to call back  

## 2016-02-11 NOTE — Telephone Encounter (Signed)
Spoke with the pt  She states no need to check her formulary, b/c med is covered if we sent it to [facility]  I have sent in a new rx per her request  Nothing further needed per pt

## 2016-02-11 NOTE — Telephone Encounter (Signed)
Patient Returned call [phone] Please call after 3

## 2016-02-25 ENCOUNTER — Other Ambulatory Visit: Payer: Self-pay | Admitting: Internal Medicine

## 2016-03-24 ENCOUNTER — Other Ambulatory Visit: Payer: Self-pay | Admitting: Internal Medicine

## 2016-04-25 ENCOUNTER — Other Ambulatory Visit: Payer: Self-pay | Admitting: Internal Medicine

## 2016-05-22 ENCOUNTER — Other Ambulatory Visit: Payer: Self-pay | Admitting: Internal Medicine

## 2016-06-20 ENCOUNTER — Other Ambulatory Visit: Payer: Self-pay | Admitting: Internal Medicine

## 2016-07-22 ENCOUNTER — Other Ambulatory Visit: Payer: Self-pay | Admitting: Internal Medicine

## 2016-07-26 ENCOUNTER — Ambulatory Visit (INDEPENDENT_AMBULATORY_CARE_PROVIDER_SITE_OTHER): Payer: BC Managed Care – PPO | Admitting: Neurology

## 2016-07-26 ENCOUNTER — Encounter: Payer: Self-pay | Admitting: Neurology

## 2016-07-26 VITALS — BP 160/88 | HR 76 | Ht 63.0 in | Wt 188.0 lb

## 2016-07-26 DIAGNOSIS — R0683 Snoring: Secondary | ICD-10-CM

## 2016-07-26 DIAGNOSIS — G4719 Other hypersomnia: Secondary | ICD-10-CM | POA: Insufficient documentation

## 2016-07-26 DIAGNOSIS — G4730 Sleep apnea, unspecified: Secondary | ICD-10-CM | POA: Diagnosis not present

## 2016-07-26 DIAGNOSIS — G4710 Hypersomnia, unspecified: Secondary | ICD-10-CM | POA: Diagnosis not present

## 2016-07-26 NOTE — Progress Notes (Signed)
SLEEP MEDICINE CLINIC   Provider:  Larey Seat, M D  Referring Provider: Leonides Sake, MD Primary Care Physician:  Leonides Sake, MD  Chief Complaint  Patient presents with  . New Patient (Initial Visit)    on nuvigil, has not tried adderall/ritalin/vyvanse    HPI:  Misty Curtis is a 55 y.o. female , seen here as a referral from Dr. Bo Merino for an elevation of her sleep.   Chief complaint according to patient : " excessive daytime sleepiness, for years on Nuvigil"   Misty Curtis has been treated for many years with modafinil in her case was armodafinil. She has still a prescription for armodafinil at this time at home but her insurance has made it difficult for her to obtain refills. They expect a sleep study to document that she has narcolepsy or another sleep disordered justifying the expense of the medication. Misty Curtis suffers from autoimmune disease and is therefore followed by her rheumatologist. Lupus, Reynaud's and Sjoegren's sicca  ; She has dry eyes, hypothyroidism, she is treated with chloroquine 200 mg in the morning and 100 mm times at night, methotrexate, Symbicort, fluticasone, clotrimazole, Sudafed, magnesium and Magic mouthwash. She has also a history of remote even in warmer weather. She has a history of melanoma of the for head and the melanoma from the left lower back removed recently by Dr. Crista Luria.  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 Misty Curtis arrives at school at 7:30 and sometimes doesn't leave to the late evening hours. 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  Sleep habits are as follows: 10 PM is her bedtime and she rises at 5.45 am, GETTING 6 HOURS OF SLEEP, ON WEEKENDS, 7.5 . She describes her bedroom is cool, quiet and dark. She usually sleeps on her left side, she uses 2 pillows to support the head and 1 for body support. She usually has no delayed onset of sleep as promptly  asleep. She will have one bathroom break usually around 2 AM, but after that also can go back to sleep immediately. She may dream a lot, no nightmarish, no sleep paralysis, no dream intrusion. Palpitations or diaphoresis are also denied.  Misty Curtis does not have a headache in the morning. She is snoring when she is very exhausted very tired but not nightly. Her husband who shares the same bedroom has been telling her this. Misty Curtis has not noticed any apneas. 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  Her sleepiness begun  In her twenties, perhaps in high school , preceding the Lupus diagnoses.   Sleep medical history and family sleep history: She is not aware of any blood relation similar problems to stay awake in daytime. Her younger sister has Hashimoto's thyroiditis, brother died in 45 from a heat stroke. An older sister has hypothyroidism and is excessively gaining weight. Mother has no known autoimmune disease father neither.   Social history: married, gifted Pharmacist, hospital, No tobacco products ever, ETOH 2 glasses a month, caffeine - sweet tea 3 glasses a day. No coffee, no sodas.    Review of Systems: Out of a complete 14 system review, the patient complains of only the following symptoms, and all other reviewed systems are negative. Memory loss, seen by Dr. Valentina Shaggy. Plaquenil toxicity.  Epworth score 18  How likely are you to doze in the following situations: 0 = not likely, 1 = slight chance, 2 = moderate chance, 3 =  high chance  Sitting and Reading? 2 Watching Television?3 Sitting inactive in a public place (theater or meeting)?2 Lying down in the afternoon when circumstances permit?3 Sitting and talking to someone?3 Sitting quietly after lunch without alcohol?3 In a car, while stopped for a few minutes in traffic?0 As a passenger in a car for an hour without a break?-2  Total = 18   , Fatigue severity score  44   ,  depression score    Social History   Social History  . Marital status: Married      Spouse name: N/A  . Number of children: 3  . Years of education: N/A   Occupational History  . special ed teacher [employer] History Main Topics  . Smoking status: Never Smoker  . Smokeless tobacco: Not on file  . Alcohol use Not on file  . Drug use: Unknown  . Sexual activity: Not on file   Other Topics Concern  . Not on file   Social History Narrative  . No narrative on file    Family History  Problem Relation Age of Onset  . Allergies Father   . Heart disease Father     bacterial endocarditis  . Cancer Maternal Grandmother     Past Medical History:  Diagnosis Date  . Allergic rhinitis    skin test 01-18-10  . Bronchitis   . Lupus (systemic lupus erythematosus) (HCC)    joints and skin rash. Dr. Estanislado Pandy  . Melanoma ([place])   . Rhinosinusitis   . Skin cancer     Past Surgical History:  Procedure Laterality Date  . ABDOMINAL HYSTERECTOMY    . skin cancer extraction      Current Outpatient Prescriptions  Medication Sig Dispense Refill  . Ascorbic Acid (VITAMIN C) 500 MG tablet Take 500 mg by mouth daily.      . B Complex Vitamins (VITAMIN B COMPLEX PO) Take 1 tablet by mouth daily.      . B-D TB SYRINGE 1CC/27GX1/2" 27G X 1/2" 1 ML MISC Use with Methotrexate Injections    . BIOTENE DRY MOUTH (BIOTENE) LIQD Place 1 application onto teeth as needed.      . budesonide-formoterol (SYMBICORT) 160-4.5 MCG/ACT inhaler INHALE 2 PUFFS INTO THE LUNGS TWICE DAILY. RINSE MOUTH AFTER USE 10.2 g 6  . cholecalciferol (VITAMIN D) 1000 UNITS tablet Take 2,000 Units by mouth. 6 days weekly    . clotrimazole-betamethasone (LOTRISONE) cream Apply topically 2 (two) times daily. 30 g prn  . Digestive Enzymes (DIGESTIVE ENZYME PO) Take by mouth.    . fluticasone (FLONASE) 50 MCG/ACT nasal spray SHAKE WELL AND USE 1 TO 2 SPRAYS IN EACH NOSTRIL 1 TO 2 TIMES DAILY 16 g prn  . folic acid (FOLVITE) 1 MG tablet Take 2 tablets by mouth daily with supper.    .  hydroxychloroquine (PLAQUENIL) 200 MG tablet Take 300 mg by mouth daily.     . Imiquimod 3.75 % CREA Apply topically. ON HOLD    . lactase (LACTAID) 3000 UNITS tablet Take by mouth 3 (three) times daily with meals.    Marland Kitchen levothyroxine (SYNTHROID, LEVOTHROID) 50 MCG tablet Take 50 mcg by mouth daily.      Marland Kitchen lisinopril (PRINIVIL,ZESTRIL) 20 MG tablet     . loratadine (CLARITIN) 10 MG tablet Take 10 mg by mouth daily.      . magnesium gluconate (MAGONATE) 500 MG tablet Take 500 mg by mouth 2 (two) times daily.    Marland Kitchen  methotrexate 25 MG/ML SOLN Inject 0.7 mg into the muscle once a week. On Friday mornings.    . montelukast (SINGULAIR) 10 MG tablet TAKE 1 TABLET BY MOUTH EVERY NIGHT AT BEDTIME 30 tablet 0  . mupirocin ointment (BACTROBAN) 2 % Place 1 application into the nose 2 (two) times daily.    . nabumetone (RELAFEN) 750 MG tablet Take 750 mg by mouth 2 (two) times daily as needed.     . NON FORMULARY Doterra Essential Oils:  Eucalyptus, Wintergreen, Lemongrass, [place], [place], On [employer]    . NUVIGIL 250 MG tablet Take 0.5 tablets by mouth Every morning.    . ondansetron (ZOFRAN-ODT) 4 MG disintegrating tablet Take 4 mg by mouth every 8 (eight) hours as needed for nausea or vomiting.    . phenylephrine (SUDAFED PE) 10 MG TABS tablet Take 10 mg by mouth every 4 (four) hours as needed.    Marland Kitchen Specialty Vitamins Products (CENTRUM PERFORMANCE) TABS Take 1 tablet by mouth daily.      . Vitamin D, Ergocalciferol, (DRISDOL) 50000 UNITS CAPS Take 1 tablet by mouth Once a week.     No current facility-administered medications for this visit.     Allergies as of 07/26/2016 - Review Complete 07/26/2016  Allergen Reaction Noted  . Latex    . Neomycin-bacitracin zn-polymyx      Vitals: BP (!) 160/88   Pulse 76   Ht 5\' 3"  (1.6 m)   Wt 188 lb (85.3 kg)   BMI 33.30 kg/m  Last Weight:  Wt Readings from Last 1 Encounters:  07/26/16 188 lb (85.3 kg)   [id number] mass index is 33.3 kg/m.     Last  Height:   Ht Readings from Last 1 Encounters:  07/26/16 5\' 3"  (1.6 m)    Physical exam:  General: The patient is awake, alert and appears not in acute distress. The patient is well groomed. Head: Normocephalic, atraumatic. Neck is supple. Mallampati 4,  neck circumference:17. Nasal airflow patent . Retrognathia is seen.  Cardiovascular:  Regular rate and rhythm , without  murmurs or carotid bruit, and without distended neck veins. Respiratory: Lungs are clear to auscultation. Skin:  Withevidence of facial butterfly  rash Trunk: BMI is elevated . The patient's posture is erect    Neurologic exam : The patient is awake and alert, oriented to place and time.   Memory subjective described as intact.  Attention span & concentration ability appears normal.  Speech is fluent,  without dysarthria, dysphonia or aphasia.  Mood and affect are appropriate.  Cranial nerves: Pupils are equal and briskly reactive to light. Funduscopic exam without evidence of pallor or edema.  Extraocular movements  in vertical and horizontal planes intact and without nystagmus. Visual fields by finger perimetry are intact. Hearing to finger rub intact.  Facial sensation intact to fine touch. Facial motor strength is symmetric and tongue and uvula move midline. Shoulder shrug was symmetrical.   Motor exam: Normal tone, muscle bulk and symmetric strength in all extremities. Sensory:  Fine touch, pinprick and vibration were tested in all extremities. Proprioception tested in the upper extremities was normal. Coordination: Rapid alternating movements in the fingers/hands was normal. Finger-to-nose maneuver  normal without evidence of ataxia, dysmetria or tremor. Gait and station: Patient walks without assistive device and is able unassisted to climb up to the exam table. Strength within normal limits.Stance is stable and normal. Tandem gait is unfragmented.  Deep tendon reflexes: in the  upper and lower extremities are  symmetric and intact. Babinski maneuver response is downgoing.  The patient was advised of the nature of the diagnosed sleep disorder , the treatment options and risks for general a health and wellness arising from not treating the condition.  I spent more than minutes of face to face time with the patient. Greater than 50% of time was spent in counseling and coordination of care. We have discussed the diagnosis and differential and I answered the patient's questions.    Assessment:  After physical and neurologic examination, review of laboratory studies,  Personal review of imaging studies, reports of other /same  Imaging studies ,  Results of polysomnography/ neurophysiology testing and pre-existing records as far as provided in visit., my assessment is   1) Mrs. Cowdrey has indeed a high level of daytime sleepiness to a degree that I find suspicious for narcolepsy. She lacks however symptoms of cataplexy, dream intrusion, hypnagogic or hypnopompic hallucinations.   2) she has been reported to snore but her husband has not found apneas to be present. A higher body mass index, high-grade Mallampati and neck circumference all pose a higher risk of obstructive sleep apnea nonetheless.  3) will need a PSG, SPLIT if AHi over 15 and if AHI less than 8 stay for MSLT.     Plan:  Treatment plan and additional workup :  RV after sleep study.    Asencion Partridge Rasul Decola MD  07/26/2016   CC: Bo Merino, MD     Leonides Sake, [address] [place], [place] [phone]

## 2016-07-26 NOTE — Patient Instructions (Signed)

## 2016-09-10 ENCOUNTER — Other Ambulatory Visit: Payer: Self-pay | Admitting: Internal Medicine

## 2016-10-09 ENCOUNTER — Ambulatory Visit (INDEPENDENT_AMBULATORY_CARE_PROVIDER_SITE_OTHER): Payer: BC Managed Care – PPO | Admitting: Neurology

## 2016-10-09 ENCOUNTER — Other Ambulatory Visit: Payer: Self-pay | Admitting: Internal Medicine

## 2016-10-09 DIAGNOSIS — G4719 Other hypersomnia: Secondary | ICD-10-CM

## 2016-10-09 DIAGNOSIS — G4710 Hypersomnia, unspecified: Secondary | ICD-10-CM

## 2016-10-09 DIAGNOSIS — R0683 Snoring: Secondary | ICD-10-CM

## 2016-10-09 DIAGNOSIS — G4730 Sleep apnea, unspecified: Secondary | ICD-10-CM | POA: Diagnosis not present

## 2016-10-10 ENCOUNTER — Encounter (INDEPENDENT_AMBULATORY_CARE_PROVIDER_SITE_OTHER): Payer: BC Managed Care – PPO | Admitting: Neurology

## 2016-10-10 DIAGNOSIS — G4733 Obstructive sleep apnea (adult) (pediatric): Secondary | ICD-10-CM | POA: Diagnosis not present

## 2016-10-10 DIAGNOSIS — G4761 Periodic limb movement disorder: Secondary | ICD-10-CM | POA: Diagnosis not present

## 2016-10-18 ENCOUNTER — Telehealth: Payer: Self-pay

## 2016-10-18 NOTE — Telephone Encounter (Signed)
I called pt to discuss sleep study results. No answer, left a message asking her to call me back. 

## 2016-10-20 NOTE — Telephone Encounter (Signed)
Received call back from patient. Informed her that her sleep study did not show that she has obstructive sleep apnea or narcolepsy. The results did show she has excessive daytime sleepiness. Advised her Dr Brett Fairy would like to see her for FU to discuss results and further treatment options and recommendations. Scheduled patient for first available; requested she arrive 15 min early to check in. She verbalized understanding, appreciation.

## 2016-10-20 NOTE — Telephone Encounter (Signed)
Attempted to reach patient. She was not at home. Her husband stated she cannot receive calls on her cell phone at work. He suggested this RN call her close to 5 pm today. Will try to reach later.

## 2016-10-20 NOTE — Telephone Encounter (Signed)
Pt returned RN's call. She is wanting to know the results  Thank you

## 2016-10-24 ENCOUNTER — Other Ambulatory Visit: Payer: Self-pay | Admitting: Rheumatology

## 2016-10-24 NOTE — Telephone Encounter (Signed)
Ok to refill [facility]

## 2016-10-24 NOTE — Telephone Encounter (Signed)
Last visit 07/12/16 Next visit 12/08/16 Ok to refill Nuvigil ?

## 2016-10-29 ENCOUNTER — Other Ambulatory Visit: Payer: Self-pay | Admitting: Rheumatology

## 2016-10-31 ENCOUNTER — Other Ambulatory Visit: Payer: Self-pay

## 2016-10-31 DIAGNOSIS — G4719 Other hypersomnia: Secondary | ICD-10-CM

## 2016-10-31 DIAGNOSIS — G4730 Sleep apnea, unspecified: Secondary | ICD-10-CM

## 2016-10-31 DIAGNOSIS — G4710 Hypersomnia, unspecified: Secondary | ICD-10-CM

## 2016-10-31 DIAGNOSIS — R0683 Snoring: Secondary | ICD-10-CM

## 2016-11-08 ENCOUNTER — Encounter: Payer: Self-pay | Admitting: Neurology

## 2016-11-08 ENCOUNTER — Ambulatory Visit (INDEPENDENT_AMBULATORY_CARE_PROVIDER_SITE_OTHER): Payer: BC Managed Care – PPO | Admitting: Neurology

## 2016-11-08 VITALS — BP 153/85 | HR 82 | Resp 20 | Ht 63.0 in | Wt 187.0 lb

## 2016-11-08 DIAGNOSIS — G4730 Sleep apnea, unspecified: Secondary | ICD-10-CM

## 2016-11-08 DIAGNOSIS — G4719 Other hypersomnia: Secondary | ICD-10-CM | POA: Diagnosis not present

## 2016-11-08 DIAGNOSIS — G4710 Hypersomnia, unspecified: Secondary | ICD-10-CM | POA: Diagnosis not present

## 2016-11-08 DIAGNOSIS — G4761 Periodic limb movement disorder: Secondary | ICD-10-CM | POA: Diagnosis not present

## 2016-11-08 DIAGNOSIS — G4733 Obstructive sleep apnea (adult) (pediatric): Secondary | ICD-10-CM

## 2016-11-08 MED ORDER — ARMODAFINIL 250 MG PO TABS: 250.0000 mg | ORAL_TABLET | Freq: Every morning | ORAL | 2 refills | Status: DC

## 2016-11-08 NOTE — Addendum Note (Signed)
Addended by: Larey Seat on: 11/08/2016 03:32 PM   Modules accepted: Orders

## 2016-11-08 NOTE — Patient Instructions (Signed)
Hypersomnia Introduction Hypersomnia is when you feel extremely tired during the day even though you're getting plenty of sleep at night. You may need to take naps during the day, and you may also be extremely difficult to wake up when you are sleeping. What are the causes? The cause of your hypersomnia may not be known. Hypersomnia may be caused by:  Medicines.  Sleep disorders, such as narcolepsy.  Trauma or injury to your head or nervous system.  Using drugs or alcohol.  Tumors.  Medical conditions, such as depression or hypothyroidism.  Genetics. What are the signs or symptoms? The main symptoms of hypersomnia include:  Feeling extremely tired throughout the day.  Being very difficult to wake up.  Sleeping for longer and longer periods.  Taking naps throughout the day. Other symptoms may include:  Feeling:  Restless.  Annoyed.  Anxious.  Low energy.  Having difficulty:  Remembering.  Speaking.  Thinking.  Losing your appetite.  Experiencing hallucinations. How is this diagnosed? Hypersomnia may be diagnosed by:  Medical history and physical exam. This will include a sleep history.  Completing sleep logs.  Tests may also be done, such as:  Polysomnography.  Multiple sleep latency test (MSLT). How is this treated? There is no cure for hypersomnia, but treatment can be very effective in helping manage the condition. Treatment may include:  Lifestyle and sleeping strategies to help cope with the condition.  Stimulant medicines.  Treating any underlying causes of hypersomnia. Follow these instructions at home:  Take medicines only as directed by your health care provider.  Schedule short naps for when you feel sleepiest during the day. Tell your employer or teachers that you have hypersomnia. You may be able to adjust your schedule to include time for naps.  Avoid drinking alcohol or caffeinated beverages.  Do not eat a heavy meal before  bedtime. Eat at about the same times every day.  Do not drive or operate heavy machinery if you are sleepy.  Do not swim or go out on the water without a life jacket.  If possible, adjust your schedule so that you do not have to work or be active at night.  Keep all follow-up visits as directed by your health care provider. This is important. Contact a health care provider if:  You have new symptoms.  Your symptoms get worse. Get help right away if: You have serious thoughts of hurting yourself or someone else. This information is not intended to replace advice given to you by your health care provider. Make sure you discuss any questions you have with your health care provider. Document Released: 11/25/2002 Document Revised: 05/12/2016 Document Reviewed: 07/10/2014  2017 Elsevier  

## 2016-11-08 NOTE — Progress Notes (Signed)
Order for cpap sent to [facility].

## 2016-11-08 NOTE — Progress Notes (Addendum)
S[facility]EP MED[facility]INE [facility]IC   Provider:  Larey Seat, M D  Referring Provider: Leonides Sake, MD Primary Care Physician:  Leonides Sake, MD  Chief Complaint  Patient presents with  . Follow-up    sleep study results    HPI:  Misty Curtis is a 55 y.o. female , seen here as a referral from Dr. Bo Merino for an elevation of her sleep.   Chief complaint according to patient : " excessive daytime sleepiness, for years on Nuvigil"   Misty Curtis has been treated for many years with modafinil in her case was armodafinil. She has still a prescription for armodafinil at this time at home but her insurance has made it difficult for her to obtain refills. They expect a sleep study to document that she has narcolepsy or another sleep disordered justifying the expense of the medication. [name] suffers from autoimmune disease and is therefore followed by her rheumatologist. Lupus, Reynaud's and Sjoegren's sicca  ; She has dry eyes, hypothyroidism, she is treated with chloroquine 200 mg in the morning and 100 mm times at night, methotrexate, Symbicort, fluticasone, clotrimazole, Sudafed, magnesium and Magic mouthwash. She has also a history of remote even in warmer weather. She has a history of melanoma of the for head and the melanoma from the left lower back removed recently by Dr. Crista Luria.  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 He arrives at school at 7:30 and sometimes doesn't leave to the late evening hours. 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  Sleep habits are as follows: 10 PM is her bedtime and she rises at 5.45 am, GETTING 6 HOURS OF SLEEP, ON WEEKENDS, 7.5 . She describes her bedroom is cool, quiet and dark. She usually sleeps on her left side, she uses 2 pillows to support the head and 1 for body support. She usually has no delayed onset of sleep as promptly asleep. She will have one bathroom break usually  around 2 AM, but after that also can go back to sleep immediately. She may dream a lot, no nightmarish, no sleep paralysis, no dream intrusion. Palpitations or diaphoresis are also denied.  He does not have a headache in the morning. She is snoring when she is very exhausted very tired but not nightly. Her husband who shares the same bedroom has been telling her this. He has not noticed any apneas. 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  Her sleepiness begun  In her twenties, perhaps in high school , preceding the Lupus diagnoses.   Sleep medical history and family sleep history: She is not aware of any blood relation similar problems to stay awake in daytime. Her younger sister has Hashimoto's thyroiditis, brother died in 86 from a heat stroke. An older sister has hypothyroidism and is excessively gaining weight. Mother has no known autoimmune disease father neither.  Social history: married, gifted Pharmacist, hospital, No tobacco products ever, ETOH 2 glasses a month, caffeine - sweet tea 3 glasses a day. No coffee, no sodas.      Interval history from 11/08/2016. I have pleasant of seeing Misty Curtis today in a revisit after her baseline polysomnography for 10/09/2016. The patient was diagnosed with very mild apnea of 5.3 per hour but during REM sleep with apnea exacerbated to 10.9 per hour there was also slight accentuation with supine sleep position. On her back her AHI was 9.3. She also had frequent periodic limb  movements and woke up about 3 times per hour of sleep from periodic limb movements this was still a valid study for an M SLT to follow. Epworth sleepiness score was endorsed at 18 points, after a total sleep time of 393 minutes the patient was invited to stay for 5 naps. She fell asleep in each of her first 4 naps with a total mean sleep latency of 13 minutes and a REM latency of 75 minutes. Reevaluating her sleep study I only using the first 4 naps still created a mean sleep latency of about 8 minutes  without sleep REM onsets. 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  Review of Systems: Out of a complete 14 system review, the patient complains of only the following symptoms, and all other reviewed systems are negative. Memory loss, seen by Dr. Valentina Shaggy. Plaquenil toxicity.  Epworth score 17  How likely are you to doze in the following situations: 0 = not likely, 1 = slight chance, 2 = moderate chance, 3 = high chance  Sitting and Reading? 2 Watching Television?2 Sitting inactive in a public place (theater or meeting)?2 Lying down in the afternoon when circumstances permit?3 Sitting and talking to someone?3 Sitting quietly after lunch without alcohol?3 In a car, while stopped for a few minutes in traffic?0 As a passenger in a car for an hour without a break?-2  Total = 17, but Misty Curtis also states that if she is at home she will fall asleep after lunch promptly she will not do so at school, as a passenger she will drop a sleep Benicar stops, but never as a driver. She does rarely recall any dreams which is confirmed by this MS LT   , Fatigue severity score  44   ,  depression score    Social History   Social History  . Marital status: Married    Spouse name: N/A  . Number of children: 3  . Years of education: N/A   Occupational History  . special ed teacher Friend History Main Topics  . Smoking status: Never Smoker  . Smokeless tobacco: Not on file  . Alcohol use Not on file  . Drug use: Unknown  . Sexual activity: Not on file   Other Topics Concern  . Not on file   Social History Narrative  . No narrative on file    Family History  Problem Relation Age of Onset  . Allergies Father   . Heart disease Father     bacterial endocarditis  . Cancer Maternal Grandmother     Past Medical History:  Diagnosis Date  . Allergic rhinitis    skin test 01-18-10  . Bronchitis   . Lupus (systemic lupus erythematosus) (HCC)    joints and skin  rash. Dr. Estanislado Pandy  . Melanoma ([place])   . Rhinosinusitis   . Skin cancer     Past Surgical History:  Procedure Laterality Date  . ABDOMINAL HYSTERECTOMY    . skin cancer extraction      Current Outpatient Prescriptions  Medication Sig Dispense Refill  . Armodafinil 250 MG tablet TAKE 1 TABLET BY MOUTH EVERY MORNING 30 tablet 2  . Ascorbic Acid (VITAMIN C) 500 MG tablet Take 500 mg by mouth daily.      . B Complex Vitamins (VITAMIN B COMPLEX PO) Take 1 tablet by mouth daily.      . B-D TB SYRINGE 1CC/27GX1/2" 27G X 1/2" 1 ML MISC USE 1 SYRINGE EVERY WEEK 12  each 0  . BIOTENE DRY MOUTH (BIOTENE) LIQD Place 1 application onto teeth as needed.      . budesonide-formoterol (SYMBICORT) 160-4.5 MCG/ACT inhaler INHALE 2 PUFFS INTO THE LUNGS TWICE DAILY. RINSE MOUTH AFTER USE 10.2 g 6  . cholecalciferol (VITAMIN D) 1000 UNITS tablet Take 2,000 Units by mouth. 6 days weekly    . clotrimazole-betamethasone (LOTRISONE) cream Apply topically 2 (two) times daily. 30 g prn  . Digestive Enzymes (DIGESTIVE ENZYME PO) Take by mouth.    . fluticasone (FLONASE) 50 MCG/ACT nasal spray SHAKE WELL AND USE 1 TO 2 SPRAYS IN EACH NOSTRIL 1 TO 2 TIMES DAILY 16 g prn  . folic acid (FOLVITE) 1 MG tablet Take 2 tablets by mouth daily with supper.    . hydroxychloroquine (PLAQUENIL) 200 MG tablet Take 300 mg by mouth daily.     Marland Kitchen lactase (LACTAID) 3000 UNITS tablet Take by mouth 3 (three) times daily with meals.    Marland Kitchen levothyroxine (SYNTHROID, LEVOTHROID) 50 MCG tablet Take 50 mcg by mouth daily.      Marland Kitchen lisinopril (PRINIVIL,ZESTRIL) 20 MG tablet 10 mg.     . loratadine (CLARITIN) 10 MG tablet Take 10 mg by mouth daily.      . magnesium gluconate (MAGONATE) 500 MG tablet Take 500 mg by mouth 2 (two) times daily.    . methotrexate 25 MG/ML SOLN Inject 0.7 mg into the muscle once a week. On Friday mornings.    . montelukast (SINGULAIR) 10 MG tablet TAKE 1 TABLET BY MOUTH EVERY NIGHT AT BEDTIME 30 tablet 0  . mupirocin  ointment (BACTROBAN) 2 % Place 1 application into the nose 2 (two) times daily.    . nabumetone (RELAFEN) 750 MG tablet Take 750 mg by mouth 2 (two) times daily as needed.     . NON FORMULARY Doterra Essential Oils:  Eucalyptus, [place], Lemongrass, [place], [place], [employer]    . ondansetron (ZOFRAN-ODT) 4 MG disintegrating tablet Take 4 mg by mouth every 8 (eight) hours as needed for nausea or vomiting.    . phenylephrine (SUDAFED PE) 10 MG TABS tablet Take 10 mg by mouth every 4 (four) hours as needed.    Marland Kitchen Specialty Vitamins Products (CENTRUM PERFORMANCE) TABS Take 1 tablet by mouth daily.      . Vitamin D, Ergocalciferol, (DRISDOL) 50000 UNITS CAPS Take 1 tablet by mouth Once a week.     No current facility-administered medications for this visit.     Allergies as of 11/08/2016 - Review Complete 11/08/2016  Allergen Reaction Noted  . Latex    . Neomycin-bacitracin zn-polymyx      Vitals: BP (!) 153/85   Pulse 82   Resp 20   Ht 5\' 3"  (1.6 m)   Wt 187 lb (84.8 kg)   BMI 33.13 kg/m  Last Weight:  Wt Readings from Last 1 Encounters:  11/08/16 187 lb (84.8 kg)   [id number] mass index is 33.13 kg/m.     Last Height:   Ht Readings from Last 1 Encounters:  11/08/16 5\' 3"  (1.6 m)    Physical exam:  General: The patient is awake, alert and appears not in acute distress. The patient is well groomed. Head: Normocephalic, atraumatic. Neck is supple. Mallampati 4,  neck circumference:17. Nasal airflow patent . Retrognathia is seen.  Cardiovascular:  Regular rate and rhythm , without  murmurs or carotid bruit, and without distended neck veins. Respiratory: Lungs are clear to auscultation. Skin:  Withevidence of facial butterfly  rash Trunk: BMI is elevated . The patient's posture is erect    Neurologic exam : The patient is awake and alert, oriented to place and time.   Memory subjective described as intact.  Attention span & concentration ability appears normal.    Speech is fluent,  without dysarthria, dysphonia or aphasia.  Mood and affect are appropriate.  Cranial nerves: Pupils are equal and briskly reactive to light. Funduscopic exam without evidence of pallor or edema.  Extraocular movements  in vertical and horizontal planes intact and without nystagmus. Visual fields by finger perimetry are intact. Hearing to finger rub intact.  Facial sensation intact to fine touch. Facial motor strength is symmetric and tongue and uvula move midline. Shoulder shrug was symmetrical.   Motor exam: Normal tone, muscle bulk and symmetric strength in all extremities. Sensory:  Fine touch, pinprick and vibration were tested in all extremities. Proprioception tested in the upper extremities was normal. Coordination: Rapid alternating movements in the fingers/hands was normal. Finger-to-nose maneuver  normal without evidence of ataxia, dysmetria or tremor. Gait and station: Patient walks without assistive device and is able unassisted to climb up to the exam table. Strength within normal limits.Stance is stable and normal. Tandem gait is unfragmented.  Deep tendon reflexes: in the  upper and lower extremities are symmetric and intact. Babinski maneuver response is downgoing.  The patient was advised of the nature of the diagnosed sleep disorder , the treatment options and risks for general a health and wellness arising from not treating the condition.  I spent more than minutes of face to face time with the patient. Greater than 50% of time was spent in counseling and coordination of care. We have discussed the diagnosis and differential and I answered the patient's questions.    Assessment:  After physical and neurologic examination, review of laboratory studies,  Personal review of imaging studies, reports of other /same  Imaging studies ,  Results of polysomnography/ neurophysiology testing and pre-existing records as far as provided in visit., my assessment is   1)  Mrs. Rent has indeed a high level of daytime sleepiness to a degree that I find suspicious for narcolepsy. She lacks however symptoms of cataplexy, dream intrusion, hypnagogic or hypnopompic hallucinations. Her MS LT has confirmed that she did not have REM sleep onset and she was not on REM suppressant medication. 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  2) she has been reported to snore but her husband has not found apneas to be present. A polysomnography confirmed that she has a mild, positional and REM dependent apnea but overall AHI of 5.3 was very mild in my opinion to mild to put on a CPAP. She has already had a dental device to treat snoring in the past.   She did have some periodic limb movements, infrequently associated with arousals. Her husband has never reported that she twitches and she has never felt that she is a restless sleeper. I do not think we should medically treat periodic limb movements that were only relevant during sleep study data gathering.   A higher body mass index, high-grade Mallampati and neck circumference all pose a higher risk of snoring and apnea nonetheless. I would like for the patient to pursue weight loss.     Plan:  Treatment plan and additional workup :  I would like to offer modafinil to the patient as a treatment  for idiopathic hypersomnia, NUVIGIL was ordered , but insurance waited on sleep study for approval.  She has  mild obstructive sleep apnea, in REM 10.9 AHI.  intolerance for dental device. The patient has never been treated on CPAP. She has an  autoimmune disorder which causes fatigue and sleepiness, too.  I will write for NUVIGIL and CPAP auto-titration , 5-10 cm water. Rv in 90 days.   Asencion Partridge Analucia Hush MD  11/08/2016   CC: Bo Merino, MD     Leonides Sake, [address] [place], [place] [id number]

## 2016-11-09 ENCOUNTER — Other Ambulatory Visit: Payer: Self-pay | Admitting: Internal Medicine

## 2016-12-06 DIAGNOSIS — Z8679 Personal history of other diseases of the circulatory system: Secondary | ICD-10-CM | POA: Insufficient documentation

## 2016-12-06 DIAGNOSIS — R5383 Other fatigue: Secondary | ICD-10-CM | POA: Insufficient documentation

## 2016-12-06 DIAGNOSIS — M359 Systemic involvement of connective tissue, unspecified: Secondary | ICD-10-CM | POA: Insufficient documentation

## 2016-12-06 DIAGNOSIS — J4520 Mild intermittent asthma, uncomplicated: Secondary | ICD-10-CM | POA: Insufficient documentation

## 2016-12-06 DIAGNOSIS — I7300 Raynaud's syndrome without gangrene: Secondary | ICD-10-CM | POA: Insufficient documentation

## 2016-12-06 DIAGNOSIS — Z79899 Other long term (current) drug therapy: Secondary | ICD-10-CM | POA: Insufficient documentation

## 2016-12-06 NOTE — Progress Notes (Signed)
Office Visit Note  Patient: Misty Curtis             Date of Birth: 1961/08/05           MRN: [id number]             PCP: Leonides Sake, MD Referring: Leonides Sake, MD Visit Date: 12/08/2016 Occupation: Teacher    Subjective:   Pain in hands  History of Present Illness: Misty Curtis is a 55 y.o. female with history of autoimmune disease. She states that she's been having recurrent cough and sinus infection since the last visit. In October developed a laceration in her right hand from a dog leash. She went to the emergency room and had sutures placed. She states the range of motion is good now. She still having some discomfort in her right fifth MCP joint. She continues to have dry eyes. She also has some generalized aches and pains. She's been using carpal tunnel syndrome braces at night which is been helpful.  Activities of Daily Living:  Patient reports morning stiffness for15  minutes.   Patient Denies nocturnal pain.  Difficulty dressing/grooming: Denies Difficulty climbing stairs: Denies Difficulty getting out of chair: Denies Difficulty using hands for taps, buttons, cutlery, and/or writing: Denies   Review of Systems  Constitutional: Positive for fatigue and weakness. Negative for night sweats, weight gain and weight loss.  HENT: Positive for mouth sores and mouth dryness. Negative for trouble swallowing, trouble swallowing and nose dryness.   Eyes: Positive for dryness. Negative for pain, redness and visual disturbance.  Respiratory: Negative for cough, shortness of breath and difficulty breathing.   Cardiovascular: Negative for chest pain, palpitations, hypertension, irregular heartbeat and swelling in legs/feet.  Gastrointestinal: Negative for blood in stool, constipation and diarrhea.  Endocrine: Negative for increased urination.  Genitourinary: Negative for vaginal dryness.  Musculoskeletal: Positive for arthralgias, joint pain and morning stiffness. Negative for  joint swelling, myalgias, muscle weakness, muscle tenderness and myalgias.  Skin: Positive for color change and rash. Negative for hair loss, skin tightness, ulcers and sensitivity to sunlight.  Allergic/Immunologic: Negative for susceptible to infections.  Neurological: Negative for dizziness, memory loss and night sweats.  Hematological: Negative for swollen glands.  Psychiatric/Behavioral: Negative for depressed mood and sleep disturbance. The patient is not nervous/anxious.     PMFS History:  Patient Active Problem List   Diagnosis Date Noted  . Acquired hypothyroidism 12/08/2016  . Autoimmune disease ([place]) 12/06/2016  . Other fatigue 12/06/2016  . Raynaud's disease without gangrene 12/06/2016  . High risk medication use 12/06/2016  . History of asthma 12/06/2016  . History of hypertension 12/06/2016  . OSA (obstructive sleep apnea) 11/08/2016  . Excessive daytime sleepiness 07/26/2016  . Hypersomnia with sleep apnea 07/26/2016  . Snoring 07/26/2016  . Laryngitis 11/10/2012  . Postnasal drip 05/02/2012  . Acute sinusitis, unspecified 10/24/2011  . HYPERTENSION 12/14/2009  . BRONCHITIS 12/14/2009  . LUPUS 12/14/2009  . History of melanoma 12/14/2009  . Seasonal and perennial allergic rhinitis 12/14/2009    Past Medical History:  Diagnosis Date  . Allergic rhinitis    skin test 01-18-10  . Bronchitis   . Lupus (systemic lupus erythematosus) (HCC)    joints and skin rash. Dr. Estanislado Pandy  . Melanoma ([place])   . Rhinosinusitis   . Skin cancer     Family History  Problem Relation Age of Onset  . Allergies Father   . Heart disease Father     bacterial endocarditis  . Cancer  Maternal Grandmother    Past Surgical History:  Procedure Laterality Date  . ABDOMINAL HYSTERECTOMY    . skin cancer extraction     Social History   Social History Narrative  . No narrative on file     Objective: Vital Signs: BP 136/80   Pulse 78   Resp 14   Ht 5\' 3"  (1.6 m)   Wt 188 lb  (85.3 kg)   BMI 33.30 kg/m    Physical Exam   Musculoskeletal Exam: C-spine and thoracic spine good range of motion some liimitation of range of motion of lumbar spine. Shoulder joints, elbow joints, wrist joints, MCPs PIPs DIPs with good range of motion. With no synovitis she had some areas where she had lacerations on her right hand which have healed completely. Hip joints, knee joints, ankles MTPs PIPs also good range of motion with no synovitis.  CDAI Exam: CDAI Homunculus Exam:   Joint Counts:  CDAI Tender Joint count: 0 CDAI Swollen Joint count: 0  Global Assessments:  Patient Global Assessment: 5 Provider Global Assessment: 3  CDAI Calculated Score: 8    Investigation: Findings:  Autoimmune disease.  Positive ANA, double-stranded DNA High risk medication use Plaquenil and Methotrexate 07/12/2016 normal PLQ eye exam  07/07/2016 CBC with Diff and CMP with GFR normal     Imaging: No results found.  Speciality Comments: No specialty comments available.    Procedures:  No procedures performed Allergies: Latex and Neomycin-bacitracin zn-polymyx   Assessment / Plan:     Visit Diagnoses: Autoimmune disease ([place]) - History of positive ANA, DS DNA, Raynauds, fatigue, oral ulcers, nasal ulcers, arthralgias,sicca . She continues to have some dry mouth and dry eyes some. She gives history of recent oral ulcer which is healed. I will obtain following labs to monitor her disease process.- Plan: Urinalysis, Routine w reflex microscopic, Anti-DNA antibody, double-stranded, C3 and C4, Sedimentation rate, Rheumatoid factor, Protein electrophoresis, serum  High risk medication use - 07/12/2016 normal PLQ eye exam, Plaquenil 200 mg a.m. and 100 mg p.m.  - Plan: CBC with Differential/Platelet, COMPLETE METABOLIC PANEL WITH GFR, CBC with Differential/Platelet, COMPLETE METABOLIC PANEL WITH GFR  Raynaud's disease : Mildly active  Other fatigue: Continues to have fatigue. She is  seeing neurologist and may give a trial of CPAP. Patient states that she does not have sleep apnea but may benefit from CPAP.  History of melanoma - On right ear 2015, forehead 2016 and 2017, followed up by Dr. Tonia Brooms  Laceration on her right hand has healed completely.  Her other medical problems are as follows for which she's seeing other physicians:  History of hypertension  History of asthma  Acquired hypothyroidism   History of Asthma    Orders: Orders Placed This Encounter  Procedures  . CBC with Differential/Platelet  . COMPLETE METABOLIC PANEL WITH GFR  . CBC with Differential/Platelet  . COMPLETE METABOLIC PANEL WITH GFR  . Urinalysis, Routine w reflex microscopic  . Anti-DNA antibody, double-stranded  . C3 and C4  . Sedimentation rate  . Rheumatoid factor  . Protein electrophoresis, serum   No orders of the defined types were placed in this encounter.   Face-to-face time spent with patient was 30 minutes. 50% of time was spent in counseling and coordination of care.  Follow-Up Instructions: Return in about 5 months (around 05/08/2017) for Autoimmune disease.   Bo Merino, MD

## 2016-12-08 ENCOUNTER — Ambulatory Visit (INDEPENDENT_AMBULATORY_CARE_PROVIDER_SITE_OTHER): Payer: BC Managed Care – PPO | Admitting: Rheumatology

## 2016-12-08 ENCOUNTER — Encounter: Payer: Self-pay | Admitting: Rheumatology

## 2016-12-08 VITALS — BP 136/80 | HR 78 | Resp 14 | Ht 63.0 in | Wt 188.0 lb

## 2016-12-08 DIAGNOSIS — M359 Systemic involvement of connective tissue, unspecified: Secondary | ICD-10-CM | POA: Diagnosis not present

## 2016-12-08 DIAGNOSIS — E039 Hypothyroidism, unspecified: Secondary | ICD-10-CM | POA: Diagnosis not present

## 2016-12-08 DIAGNOSIS — Z79899 Other long term (current) drug therapy: Secondary | ICD-10-CM

## 2016-12-08 DIAGNOSIS — Z8679 Personal history of other diseases of the circulatory system: Secondary | ICD-10-CM

## 2016-12-08 DIAGNOSIS — Z85820 Personal history of malignant melanoma of skin: Secondary | ICD-10-CM | POA: Diagnosis not present

## 2016-12-08 DIAGNOSIS — I7300 Raynaud's syndrome without gangrene: Secondary | ICD-10-CM | POA: Diagnosis not present

## 2016-12-08 DIAGNOSIS — R5383 Other fatigue: Secondary | ICD-10-CM | POA: Diagnosis not present

## 2016-12-08 DIAGNOSIS — Z8709 Personal history of other diseases of the respiratory system: Secondary | ICD-10-CM | POA: Diagnosis not present

## 2016-12-09 LAB — CBC WITH DIFFERENTIAL/PLATELET
BASOS ABS: 88 {cells}/uL (ref 0–200)
Basophils Relative: 1 %
EOS ABS: 176 {cells}/uL (ref 15–500)
EOS PCT: 2 %
HCT: 45.3 % — ABNORMAL HIGH (ref 35.0–45.0)
HEMOGLOBIN: 15.3 g/dL (ref 11.7–15.5)
LYMPHS ABS: 2640 {cells}/uL (ref 850–3900)
Lymphocytes Relative: 30 %
MCH: 31 pg (ref 27.0–33.0)
MCHC: 33.8 g/dL (ref 32.0–36.0)
MCV: 91.7 fL (ref 80.0–100.0)
MPV: 9.4 fL (ref 7.5–12.5)
Monocytes Absolute: 616 cells/uL (ref 200–950)
Monocytes Relative: 7 %
NEUTROS PCT: 60 %
Neutro Abs: 5280 cells/uL (ref 1500–7800)
Platelets: 334 10*3/uL (ref 140–400)
RBC: 4.94 MIL/uL (ref 3.80–5.10)
RDW: 14.3 % (ref 11.0–15.0)
WBC: 8.8 10*3/uL (ref 3.8–10.8)

## 2016-12-09 LAB — URINALYSIS, ROUTINE W REFLEX MICROSCOPIC
BILIRUBIN URINE: NEGATIVE
GLUCOSE, UA: NEGATIVE
Hgb urine dipstick: NEGATIVE
Ketones, ur: NEGATIVE
Leukocytes, UA: NEGATIVE
Nitrite: NEGATIVE
PH: 8 (ref 5.0–8.0)
Protein, ur: NEGATIVE
SPECIFIC GRAVITY, URINE: 1.01 (ref 1.001–1.035)

## 2016-12-09 LAB — COMPLETE METABOLIC PANEL WITH GFR
ALBUMIN: 4.1 g/dL (ref 3.6–5.1)
ALK PHOS: 79 U/L (ref 33–130)
ALT: 11 U/L (ref 6–29)
AST: 22 U/L (ref 10–35)
BILIRUBIN TOTAL: 0.4 mg/dL (ref 0.2–1.2)
BUN: 12 mg/dL (ref 7–25)
CALCIUM: 9.4 mg/dL (ref 8.6–10.4)
CO2: 28 mmol/L (ref 20–31)
Chloride: 102 mmol/L (ref 98–110)
Creat: 0.98 mg/dL (ref 0.50–1.05)
GFR, EST AFRICAN AMERICAN: 74 mL/min (ref >=60)
GFR, EST NON AFRICAN AMERICAN: 64 mL/min (ref >=60)
GLUCOSE: 107 mg/dL — AB (ref 65–99)
POTASSIUM: 4.1 mmol/L (ref 3.5–5.3)
SODIUM: 142 mmol/L (ref 135–146)
TOTAL PROTEIN: 6.7 g/dL (ref 6.1–8.1)

## 2016-12-09 LAB — ANTI-DNA ANTIBODY, DOUBLE-STRANDED: DS DNA AB: 1 [IU]/mL

## 2016-12-09 LAB — SEDIMENTATION RATE: SED RATE: 4 mm/h (ref 0–30)

## 2016-12-09 LAB — C3 AND C4
C3 Complement: 146 mg/dL (ref 90–180)
C4 Complement: 28 mg/dL (ref 16–47)

## 2016-12-09 LAB — RHEUMATOID FACTOR: RHEUMATOID FACTOR: 14 [IU]/mL — AB (ref <14)

## 2016-12-09 NOTE — Progress Notes (Signed)
Labs normal.

## 2016-12-13 LAB — PROTEIN ELECTROPHORESIS, SERUM
Albumin ELP: 3.9 g/dL (ref 3.8–4.8)
Alpha-1-Globulin: 0.5 g/dL — ABNORMAL HIGH (ref 0.2–0.3)
Alpha-2-Globulin: 0.8 g/dL (ref 0.5–0.9)
BETA GLOBULIN: 0.5 g/dL (ref 0.4–0.6)
Beta 2: 0.3 g/dL (ref 0.2–0.5)
Gamma Globulin: 0.9 g/dL (ref 0.8–1.7)
Total Protein, Serum Electrophoresis: 6.9 g/dL (ref 6.1–8.1)

## 2016-12-18 NOTE — Progress Notes (Signed)
Labs stable, no change in treatment

## 2016-12-20 ENCOUNTER — Telehealth: Payer: Self-pay | Admitting: Radiology

## 2016-12-20 ENCOUNTER — Encounter: Payer: Self-pay | Admitting: Rheumatology

## 2016-12-20 NOTE — Telephone Encounter (Signed)
Called patient to advise labs c/w previous

## 2016-12-20 NOTE — Telephone Encounter (Signed)
-----   Message from Bo Merino, MD sent at 12/18/2016  8:05 PM EST ----- Labs stable, no change in treatment

## 2016-12-21 ENCOUNTER — Other Ambulatory Visit: Payer: Self-pay | Admitting: Radiology

## 2016-12-21 DIAGNOSIS — R778 Other specified abnormalities of plasma proteins: Secondary | ICD-10-CM

## 2016-12-22 ENCOUNTER — Other Ambulatory Visit: Payer: Self-pay | Admitting: *Deleted

## 2016-12-22 DIAGNOSIS — R778 Other specified abnormalities of plasma proteins: Secondary | ICD-10-CM

## 2016-12-26 LAB — PROTEIN,TOTAL AND ELECTROPHOR W/IFE
ALPHA-2-GLOBULIN: 0.7 g/dL (ref 0.5–0.9)
Albumin ELP: 4 g/dL (ref 3.8–4.8)
Alpha-1-Globulin: 0.3 g/dL (ref 0.2–0.3)
BETA GLOBULIN: 0.4 g/dL (ref 0.4–0.6)
Beta 2: 0.3 g/dL (ref 0.2–0.5)
GAMMA GLOBULIN: 0.9 g/dL (ref 0.8–1.7)
TOTAL PROTEIN, SERUM ELECTROPHOR: 6.6 g/dL (ref 6.1–8.1)

## 2016-12-26 NOTE — Progress Notes (Signed)
SPEP negative

## 2017-01-02 ENCOUNTER — Ambulatory Visit (INDEPENDENT_AMBULATORY_CARE_PROVIDER_SITE_OTHER): Payer: BC Managed Care – PPO | Admitting: Internal Medicine

## 2017-01-02 ENCOUNTER — Encounter: Payer: Self-pay | Admitting: Internal Medicine

## 2017-01-02 DIAGNOSIS — G4733 Obstructive sleep apnea (adult) (pediatric): Secondary | ICD-10-CM

## 2017-01-02 DIAGNOSIS — J3089 Other allergic rhinitis: Secondary | ICD-10-CM | POA: Diagnosis not present

## 2017-01-02 DIAGNOSIS — J4520 Mild intermittent asthma, uncomplicated: Secondary | ICD-10-CM | POA: Diagnosis not present

## 2017-01-02 DIAGNOSIS — J302 Other seasonal allergic rhinitis: Secondary | ICD-10-CM

## 2017-01-02 MED ORDER — FLUTICASONE PROPIONATE 50 MCG/ACT NA SUSP: NASAL | 99 refills | Status: DC

## 2017-01-02 NOTE — Patient Instructions (Signed)
Flonase refill sent  Ok to call for inhaler refill when needed  Please call if we can help  Consider trying otrc nasal saline gel for dry/ raw nose if needed

## 2017-01-02 NOTE — Progress Notes (Signed)
Patient ID: Misty Curtis, female    DOB: 03/01/1961, 56 y.o.   MRN: [id number]  HPI female never smoker followed for allergy, bronchitis, complicated by HBP, lupus, OSA (NEUROLOGY)  ---------------------------------------------------  01/11/2016-56 year old female never smoker followed for allergy, bronchitis, complicated by HBP, lupus FOLLOWS FOR:Pt unable to get over "bronchitis  flare" that she caught Dec 2016; other than that usual allergies bothering her. Residual head and chest congestion with scant clear mucus, little wheezing. Using azelastine and fluticasone nasal sprays Occasional Sudafed PE and Robitussin with saline rinse.  01/02/17-  female never smoker followed for allergy, bronchitis, complicated by HBP, lupus, OSA ( [facility]) FOLLOWS FOR:Pt states her breathing holds well until about Sept-started nasal spray. Had sleep study as well-uses CPAP machine(Dr Dohemier started this). Also using Nuvigil  Chronic cold weather managed by using saline and Flonase. Uses a fullface mask with her CPAP machine. Rare wheeze. Symbicort has been sufficient with rare use of rescue inhaler.  Review of Systems-see HPI Constitutional:   No-   weight loss, night sweats, fevers, chills, fatigue, lassitude. HEENT:   No-  headaches, difficulty swallowing, tooth/dental problems, sore throat,       No-  sneezing, itching, +ear ache, +nasal congestion, +post nasal drip,  CV:  No-   chest pain, orthopnea, PND, swelling in lower extremities, anasarca, dizziness, palpitations Resp: No-   shortness of breath with exertion or at rest.              No-   productive cough,  No non-productive cough,  No- coughing up of blood.              No-   change in color of mucus.  Little wheezing.   Skin: +rash or lesions. GI:  No-   heartburn, indigestion, abdominal pain, nausea, vomiting,  GU:  MS:  No-   joint pain or swelling.  Neuro-     nothing unusual Psych:  No- change in mood or affect. No depression or  anxiety.  No memory loss.  Objective:   Physical Exam General- Alert, Oriented, Affect-appropriate, Distress- none acute, + overweight Skin- rash-none, lesions- none, excoriation- none Lymphadenopathy- none Head- atraumatic            Eyes- Gross vision intact, PERRLA, conjunctivae clear secretions            Ears- Hearing, canals-normal. TMs look normal            Nose- clear, no-Septal dev, mucus, polyps, erosion, perforation             Throat- Mallampati II , mucosa -clear, drainage- none, tonsils- atrophic.                    No visible postnasal drainage and minimal redness of her pharynx.                  +Mildly hoarse Neck- flexible , trachea midline, no stridor , thyroid nl, carotid no bruit Chest - symmetrical excursion , unlabored           Heart/CV- RRR , no murmur , no gallop  , no rub, nl s1 s2                           - JVD- none , edema- none, stasis changes- none, varices- none           Lung- clear to P&A, wheeze- none, cough- none , dullness-none,  rub- none           Chest wall-  Abd-  Br/ Gen/ Rectal- Not done, not indicated Extrem- cyanosis- none, clubbing, none, atrophy- none, strength- nl Neuro- grossly intact to observation

## 2017-01-05 ENCOUNTER — Other Ambulatory Visit: Payer: Self-pay | Admitting: Internal Medicine

## 2017-01-15 NOTE — Assessment & Plan Note (Signed)
We discussed reducing Symbicort to 1 puff daily acid transition toward seeing whether she needs it at all. It will probably be seasonal.

## 2017-01-15 NOTE — Assessment & Plan Note (Signed)
I agreed to refill prescription for Flonase. At this time of year saline may be sufficient.

## 2017-01-15 NOTE — Assessment & Plan Note (Signed)
Managed by neurology

## 2017-01-21 ENCOUNTER — Other Ambulatory Visit: Payer: Self-pay | Admitting: Rheumatology

## 2017-01-23 NOTE — Telephone Encounter (Signed)
Last Visit: 12/08/16  Next Visit: 06/02/17 Labs: 12/08/16 WNL  Okay to refill Folic acid?

## 2017-02-02 ENCOUNTER — Other Ambulatory Visit: Payer: Self-pay | Admitting: Rheumatology

## 2017-02-03 ENCOUNTER — Other Ambulatory Visit: Payer: Self-pay | Admitting: Rheumatology

## 2017-02-03 NOTE — Telephone Encounter (Signed)
Last Visit: 12/08/16 Next Visit: 06/02/17 Labs: 12/08/16 WNL  Okay to refill syringes?

## 2017-02-03 NOTE — Telephone Encounter (Signed)
Last Visit: 12/08/16 Next Visit: 06/02/17 Labs: 12/08/16 WNL  Okay to refill Zofran?

## 2017-02-05 ENCOUNTER — Encounter: Payer: Self-pay | Admitting: Neurology

## 2017-02-09 ENCOUNTER — Ambulatory Visit (INDEPENDENT_AMBULATORY_CARE_PROVIDER_SITE_OTHER): Payer: BC Managed Care – PPO | Admitting: Neurology

## 2017-02-09 ENCOUNTER — Encounter: Payer: Self-pay | Admitting: Neurology

## 2017-02-09 VITALS — BP 140/66 | HR 80 | Resp 16 | Ht 63.0 in | Wt 188.0 lb

## 2017-02-09 DIAGNOSIS — G4710 Hypersomnia, unspecified: Secondary | ICD-10-CM

## 2017-02-09 DIAGNOSIS — M359 Systemic involvement of connective tissue, unspecified: Secondary | ICD-10-CM | POA: Diagnosis not present

## 2017-02-09 DIAGNOSIS — G4730 Sleep apnea, unspecified: Secondary | ICD-10-CM | POA: Diagnosis not present

## 2017-02-09 DIAGNOSIS — D8989 Other specified disorders involving the immune mechanism, not elsewhere classified: Secondary | ICD-10-CM

## 2017-02-09 MED ORDER — MODAFINIL 200 MG PO TABS: 200.0000 mg | ORAL_TABLET | Freq: Every day | ORAL | 3 refills | Status: DC

## 2017-02-09 NOTE — Progress Notes (Signed)
S[facility]EP [facility]INE CLINIC   Provider:  Larey Seat, M D  Referring Provider: Leonides Sake, MD Primary Care Physician:  Misty Sake, MD  Chief Complaint  Patient presents with  . Follow-up    Rm 10. Patient states that she does not sleep well with CPAP.     HPI:  Misty Curtis is a 56 y.o. female , seen here as a referral from Dr. Bo Curtis for an evaluation of her sleep.   Chief complaint according to patient : " excessive daytime sleepiness, for years on Curtis"   Mrs. Misty Curtis has been treated for many years with modafinil in her case was armodafinil. She has still a prescription for armodafinil at this time at home but her insurance has made it difficult for her to obtain refills. They expect a sleep study to document that she has narcolepsy or another sleep disordered justifying the expense of the medication. Misty Curtis suffers from autoimmune disease and is therefore followed by her rheumatologist. Lupus, Reynaud's and Sjoegren's sicca  ; She has dry eyes, hypothyroidism, she is treated with chloroquine 200 mg in the morning and 100 mm times at night, methotrexate, Symbicort, fluticasone, clotrimazole, Sudafed, magnesium and Magic mouthwash. She has also a history of remote even in warmer weather. She has a history of melanoma of the for head and the melanoma from the left lower back removed recently by Misty Curtis. 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 He arrives at school at 7:30 and sometimes doesn't leave to the late evening hours. 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  Sleep habits are as follows: 10 PM is her bedtime and she rises at 5.45 am, GETTING 6 HOURS OF SLEEP, ON WEEKENDS, 7.5 . She describes her bedroom is cool, quiet and dark. She usually sleeps on her left side, she uses 2 pillows to support the head and 1 for body support. She usually has no delayed onset of sleep as promptly asleep. She  will have one bathroom break usually around 2 AM, but after that also can go back to sleep immediately. She may dream a lot, no nightmarish, no sleep paralysis, no dream intrusion. Palpitations or diaphoresis are also denied.  He does not have a headache in the morning. She is snoring when she is very exhausted very tired but not nightly. Her husband who shares the same bedroom has been telling her this. He has not noticed any apneas. 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  Her sleepiness begun  In her twenties, perhaps in high school , preceding the Lupus diagnoses.   Sleep medical history and family sleep history: She is not aware of any blood relation similar problems to stay awake in daytime. Her younger sister has Hashimoto's thyroiditis, brother died in 47 from a heat stroke. An older sister has hypothyroidism and is excessively gaining weight. Mother has no known autoimmune disease father neither. Social history: married, gifted Pharmacist, hospital, No tobacco products ever, ETOH 2 glasses a month, caffeine - sweet tea 3 glasses a day. No coffee, no sodas.    Interval history from 11/08/2016. I have pleasant of seeing Misty Curtis today in a revisit after her baseline polysomnography for 10/09/2016. The patient was diagnosed with very mild apnea of 5.3 per hour but during REM sleep with apnea exacerbated to 10.9 per hour there was also slight accentuation with supine sleep position. On her back her AHI was 9.3.  She also had frequent periodic limb movements and woke up about 3 times per hour of sleep from periodic limb movements this was still a valid study for an M SLT to follow. Epworth sleepiness score was endorsed at 18 points, after a total sleep time of 393 minutes the patient was invited to stay for 5 naps. She fell asleep in each of her first 4 naps with a total mean sleep latency of 13 minutes and a REM latency of 75 minutes. Reevaluating her sleep study I only using the first 4 naps still created a mean sleep  latency of about 8 minutes without sleep REM onsets. 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  Interval history from 02/09/2017, As the pleasure of seeing Misty Curtis today for compliance visit. I have last seen her 3 months ago when she was still endorsing excessive daytime sleepiness. The patient has now continued with CPAP and an 97% compliance has been documented an average user time of 6 hours and 3 minutes, she is using an AutoSet between 5 and 10 cm water pressure with 1 cm expiratory pressure relief at an residual AHI of 1.6. Numerically these are great results. Her 95th percentile pressure need is 9.9 cm so it is well within her current settings- but she remains daytime sleepy and endorsed the Epworth sleepiness score at 14 points, the fatigue severity score at 50 points,    Review of Systems: Out of a complete 14 system review, the patient complains of only the following symptoms, and all other reviewed systems are negative. Memory loss, seen by Dr. Valentina Shaggy. Plaquenil toxicity.  Epworth score 17  How likely are you to doze in the following situations: 0 = not likely, 1 = slight chance, 2 = moderate chance, 3 = high chance  Sitting and Reading? 2 Watching Television?2 Sitting inactive in a public place (theater or meeting)?2 Lying down in the afternoon when circumstances permit?2 Sitting and talking to someone?1 Sitting quietly after lunch without alcohol?2 In a car, while stopped for a few minutes in traffic?1 As a passenger in a car for an hour without a break?-2  Total = 14, but Misty Curtis also states that if she is at home she will fall asleep after lunch promptly she will not do so at school, as a passenger she will drop a sleep Benicar stops, but never as a driver. She does rarely recall any dreams which is confirmed by this MS LT   , Fatigue severity score  50   ,  depression score    Social History   Social History  . Marital status: Married    Spouse name: N/A    . Number of children: 3  . Years of education: N/A   Occupational History  . special ed teacher [employer] History Main Topics  . Smoking status: Never Smoker  . Smokeless tobacco: Never Used  . Alcohol use Not on file  . Drug use: Unknown  . Sexual activity: Not on file   Other Topics Concern  . Not on file   Social History Narrative  . No narrative on file    Family History  Problem Relation Age of Onset  . Allergies Father   . Heart disease Father     bacterial endocarditis  . Cancer Maternal Grandmother     Past Medical History:  Diagnosis Date  . Allergic rhinitis    skin test 01-18-10  . Bronchitis   . Lupus (systemic lupus erythematosus) (  HCC)    joints and skin rash. Dr. Estanislado Pandy  . Melanoma ([place])   . Rhinosinusitis   . Skin cancer     Past Surgical History:  Procedure Laterality Date  . ABDOMINAL HYSTERECTOMY    . skin cancer extraction      Current Outpatient Prescriptions  Medication Sig Dispense Refill  . Armodafinil 250 MG tablet TK 1 T PO  QAM    . Ascorbic Acid (VITAMIN C) 500 MG tablet Take 500 mg by mouth daily.      . B Complex Vitamins (VITAMIN B COMPLEX PO) Take by mouth.    . B-D TB SYRINGE 1CC/27GX1/2" 27G X 1/2" 1 ML MISC USE 1 SYRINGE EVERY WEEK AS DIRECTED 12 each 0  . BIOTENE DRY MOUTH (BIOTENE) LIQD Place 1 application onto teeth as needed.      . budesonide-formoterol (SYMBICORT) 160-4.5 MCG/ACT inhaler INHALE 2 PUFFS INTO THE LUNGS TWICE DAILY. RINSE MOUTH AFTER USE 10.2 g 6  . clotrimazole-betamethasone (LOTRISONE) cream Apply topically 2 (two) times daily. 30 g prn  . Digestive Enzymes (DIGESTIVE ENZYME PO) Take by mouth.    . fluticasone (FLONASE) 50 MCG/ACT nasal spray SHAKE WELL AND USE 1 TO 2 SPRAYS IN EACH NOSTRIL 1 TO 2 TIMES DAILY 16 g prn  . folic acid (FOLVITE) 1 MG tablet TAKE 2 TABLETS BY MOUTH ONCE A DAY 180 tablet 4  . hydroxychloroquine (PLAQUENIL) 200 MG tablet Take 1 tablet Every AM and 1/2  tablet every PM    . lactase (LACTAID) 3000 UNITS tablet Take by mouth as needed.     Marland Kitchen levothyroxine (SYNTHROID) 50 MCG tablet TK 1 T PO QAM FOR THYROID    . lisinopril (PRINIVIL,ZESTRIL) 10 MG tablet Take 5 mg by mouth daily.    Marland Kitchen loratadine (CLARITIN) 10 MG tablet Take 10 mg by mouth daily.      . magnesium gluconate (MAGONATE) 500 MG tablet Take 500 mg by mouth 2 (two) times daily.    . methotrexate 50 MG/2ML injection Inject 0.7 mLs into the skin once a week.     . montelukast (SINGULAIR) 10 MG tablet TAKE 1 TABLET BY MOUTH EVERY NIGHT AT BEDTIME 30 tablet 11  . mupirocin ointment (BACTROBAN) 2 % Place 1 application into the nose as needed.     . nabumetone (RELAFEN) 750 MG tablet Take 750 mg by mouth 2 (two) times daily as needed.     . NON FORMULARY Doterra Essential Oils:  Eucalyptus, [place]green, Lemongrass, [place], [place], [employer]    . ondansetron (ZOFRAN) 4 MG tablet TAKE 1 TABLET BY MOUTH EVERY 6 HOURS AS NEEDED FOR NAUSEA 30 tablet 0  . phenylephrine (SUDAFED PE) 10 MG TABS tablet Take 10 mg by mouth every 4 (four) hours as needed.    Marland Kitchen Specialty Vitamins Products (CENTRUM PERFORMANCE) TABS Take 1 tablet by mouth daily.      . Vitamin D, Ergocalciferol, (DRISDOL) 50000 units CAPS capsule TAKE 1 CAPSULE BY MOUTH ONCE A MONTH     No current facility-administered medications for this visit.     Allergies as of 02/09/2017 - Review Complete 02/09/2017  Allergen Reaction Noted  . Latex    . Neomycin-bacitracin zn-polymyx      Vitals: BP 140/66   Pulse 80   Resp 16   Ht '5\' 3"'  (1.6 m)   Wt 188 lb (85.3 kg)   BMI 33.30 kg/m  Last Weight:  Wt Readings from Last 1 Encounters:  02/09/17 188 lb (  85.3 kg)   ZPH:XTAV mass index is 33.3 kg/m.     Last Height:   Ht Readings from Last 1 Encounters:  02/09/17 '5\' 3"'  (1.6 m)    Physical exam:  General: The patient is awake, alert and appears not in acute distress. The patient is well groomed. Head: Normocephalic,  atraumatic. Neck is supple. Mallampati 4,  neck circumference:17. Nasal airflow patent . Retrognathia is seen.   Cranial nerves: Pupils are equal and briskly reactive to light. Extraocular movements  in vertical and horizontal planes intact and without nystagmus. Visual fields by finger perimetry are intact. Hearing to finger rub intact.  Facial sensation intact to fine touch. Facial motor strength is symmetric and tongue and uvula move midline. Shoulder shrug was symmetrical.   The patient was advised of the nature of the diagnosed sleep disorder , the treatment options and risks for general a health and wellness arising from not treating the condition.  I spent more than 25  minutes of face to face time with the patient. Greater than 50% of time was spent in counseling and coordination of care. We have discussed the diagnosis and differential and I answered the patient's questions.    Assessment:  After physical and neurologic examination, review of laboratory studies,  Personal review of imaging studies, reports of other /same  Imaging studies ,  Results of polysomnography/ neurophysiology testing and pre-existing records as far as provided in visit., my assessment is   1) Misty Curtis has indeed a high level of daytime sleepiness to a degree that I still find suspicious for narcolepsy. She lacks however symptoms of cataplexy, dream intrusion, hypnagogic or hypnopompic hallucinations. Her MS LT has confirmed that she did not have REM sleep onset and she was not on REM suppressant medication. 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  2) she was diagnosed with sleep apnea and has been treated with CPAP which reduced her residual AHI to 1.6. She is 97% compliance which is excellent and she has used this set up for over 90 days now, with out improving her daytime sleepiness. On the contrary she developed a facial rash  probably due to a latex allergy she didn't know about. She had to use a special liner which makes it air seal harder to obtain, the mask a sliding off. She has high air leaks now because of this.   Plan:  Treatment plan and additional workup :  HLA narcolepsy test.   I would like to offer modafinil to the patient as a treatment for idiopathic hypersomnia, Curtis was ordered , but insurance waited on sleep study for approval.  She has mild obstructive sleep apnea, in REM 10.9 AHI.  intolerance for dental device. The patient hasan  autoimmune disorder which causes fatigue and sleepiness, too.  I will write for Curtis and allow her to d/c CPAP>   Larey Seat MD  02/09/2017   CC: Misty Merino, MD     Misty Curtis, [address], [place] [address]

## 2017-02-09 NOTE — Patient Instructions (Signed)
Modafinil tablets  What is this medicine?  MODAFINIL (moe DAF i nil) is used to treat excessive sleepiness caused by certain sleep disorders. This includes narcolepsy, sleep apnea, and shift work sleep disorder.  This medicine may be used for other purposes; ask your health care provider or pharmacist if you have questions.  COMMON BRAND NAME(S): Provigil  What should I tell my health care provider before I take this medicine?  They need to know if you have any of these conditions:  -history of depression, mania, or other mental disorder  -kidney disease  -liver disease  -an unusual or allergic reaction to modafinil, other medicines, foods, dyes, or preservatives  -pregnant or trying to get pregnant  -breast-feeding  How should I use this medicine?  Take this medicine by mouth with a glass of water. Follow the directions on the prescription label. Take your doses at regular intervals. Do not take your medicine more often than directed. Do not stop taking except on your doctor's advice.  A special MedGuide will be given to you by the pharmacist with each prescription and refill. Be sure to read this information carefully each time.  Talk to your pediatrician regarding the use of this medicine in children. This medicine is not approved for use in children.  Overdosage: If you think you have taken too much of this medicine contact a poison control center or emergency room at once.  NOTE: This medicine is only for you. Do not share this medicine with others.  What if I miss a dose?  If you miss a dose, take it as soon as you can. If it is almost time for your next dose, take only that dose. Do not take double or extra doses.  What may interact with this medicine?  Do not take this medicine with any of the following medications:  -amphetamine or dextroamphetamine  -dexmethylphenidate or methylphenidate  -medicines called MAO Inhibitors like Nardil, Parnate, Marplan, Eldepryl  -pemoline  -procarbazine  This medicine may  also interact with the following medications:  -antifungal medicines like itraconazole or ketoconazole  -barbiturates like phenobarbital  -birth control pills or other hormone-containing birth control devices or implants  -carbamazepine  -cyclosporine  -diazepam  -medicines for depression, anxiety, or psychotic disturbances  -phenytoin  -propranolol  -triazolam  -warfarin  This list may not describe all possible interactions. Give your health care provider a list of all the medicines, herbs, non-prescription drugs, or dietary supplements you use. Also tell them if you smoke, drink alcohol, or use illegal drugs. Some items may interact with your medicine.  What should I watch for while using this medicine?  Visit your doctor or health care professional for regular checks on your progress. The full effects of this medicine may not be seen right away.  This medicine may affect your concentration, function, or may hide signs that you are tired. You may get dizzy. Do not drive, use machinery, or do anything that needs mental alertness until you know how this drug affects you. Alcohol can make you more dizzy and may interfere with your response to this medicine or your alertness. Avoid alcoholic drinks.  Birth control pills may not work properly while you are taking this medicine. Talk to your doctor about using an extra method of birth control.  It is unknown if the effects of this medicine will be increased by the use of caffeine. Caffeine is available in many foods, beverages, and medications. Ask your doctor if you should limit or   swelling of the face, lips, or tongue -anxiety -breathing problems -chest pain -fast, irregular  heartbeat -hallucinations -increased blood pressure -redness, blistering, peeling or loosening of the skin, including inside the mouth -sore throat, fever, or chills -suicidal thoughts or other mood changes -tremors -vomiting Side effects that usually do not require medical attention (report to your doctor or health care professional if they continue or are bothersome): -headache -nausea, diarrhea, or stomach upset -nervousness -trouble sleeping This list may not describe all possible side effects. Call your doctor for medical advice about side effects. You may report side effects to FDA at 1-800-FDA-1088. Where should I keep my medicine? Keep out of the reach of children. This medicine can be abused. Keep your medicine in a safe place to protect it from theft. Do not share this medicine with anyone. Selling or giving away this medicine is dangerous and against the law. This medicine may cause accidental overdose and death if taken by other adults, children, or pets. Mix any unused medicine with a substance like cat litter or coffee grounds. Then throw the medicine away in a sealed container like a sealed bag or a coffee can with a lid. Do not use the medicine after the expiration date. Store at room temperature between 20 and 25 degrees C (68 and 77 degrees F). NOTE: This sheet is a summary. It may not cover all possible information. If you have questions about this medicine, talk to your doctor, pharmacist, or health care provider.  2017 Elsevier/Gold Standard (2014-08-26 15:34:55)

## 2017-02-13 ENCOUNTER — Encounter: Payer: Self-pay | Admitting: Rheumatology

## 2017-02-13 ENCOUNTER — Encounter: Payer: Self-pay | Admitting: Internal Medicine

## 2017-02-13 DIAGNOSIS — J040 Acute laryngitis: Secondary | ICD-10-CM

## 2017-02-14 NOTE — Telephone Encounter (Signed)
I recommend ENT referral to [facility]NT for dx recurrent loss of voice, laryngitis

## 2017-02-14 NOTE — Telephone Encounter (Signed)
Please advise Dr Annamaria Boots. Thanks.  ----- Message -----  From: Misty Curtis  Sent: 2/26/20187:07 PM EST  To: Deneise Lever, MD Subject: Non-Urgent Medical Question  Dr. Annamaria Boots,  I have had laryngitis at least 4 times in the last 3 weeks.I am fine when I get up, but by noon my voice is going and by 2 pm my voice is gone.I do not have any fever, no cough, no sore throat, no sinus infection, or any other "sickness" symptoms.I have been cleaning with a mask and taking all of my meds on a strict schedule.  I was wondering if this is related to my Lupus or Sjgren's?Should I ask Dr.Deveshwar? Misty Curtis  Allergies as of 02/13/2017      Reactions   Latex    Neomycin-bacitracin Zn-polymyx    REACTION: rash      Medication List       Accurate as of 02/13/17 11:59 PM. Always use your most recent med list.          antiseptic oral rinse Liqd Place 1 application onto teeth as needed.   Armodafinil 250 MG tablet TK 1 T PO  QAM   B-D TB SYRINGE 1CC/27GX1/2" 27G X 1/2" 1 ML Misc Generic drug:  TUBERCULIN SYR 1CC/27GX1/2" USE 1 SYRINGE EVERY WEEK AS DIRECTED   budesonide-formoterol 160-4.5 MCG/ACT inhaler Commonly known as:  SYMBICORT INHALE 2 PUFFS INTO THE LUNGS TWICE DAILY. RINSE MOUTH AFTER USE   CENTRUM PERFORMANCE Tabs Take 1 tablet by mouth daily.   clotrimazole-betamethasone cream Commonly known as:  LOTRISONE Apply topically 2 (two) times daily.   DIGESTIVE ENZYME PO Take by mouth.   fluticasone 50 MCG/ACT nasal spray Commonly known as:  FLONASE SHAKE WELL AND USE 1 TO 2 SPRAYS IN EACH NOSTRIL 1 TO 2 TIMES DAILY   folic acid 1 MG tablet Commonly known as:  FOLVITE TAKE 2 TABLETS BY MOUTH ONCE A DAY   hydroxychloroquine 200 MG tablet Commonly known as:  PLAQUENIL Take 1 tablet Every AM and 1/2 tablet every PM   lactase 3000 units tablet Commonly known as:  LACTAID Take by mouth as needed.   lisinopril 10 MG tablet Commonly known as:   PRINIVIL,ZESTRIL Take 5 mg by mouth daily.   loratadine 10 MG tablet Commonly known as:  CLARITIN Take 10 mg by mouth daily.   magnesium gluconate 500 MG tablet Commonly known as:  MAGONATE Take 500 mg by mouth 2 (two) times daily.   methotrexate 50 MG/2ML injection Inject 0.7 mLs into the skin once a week.   modafinil 200 MG tablet Commonly known as:  PROVIGIL Take 1 tablet (200 mg total) by mouth daily.   montelukast 10 MG tablet Commonly known as:  SINGULAIR TAKE 1 TABLET BY MOUTH EVERY NIGHT AT BEDTIME   mupirocin ointment 2 % Commonly known as:  [place] 1 application into the nose as needed.   nabumetone 750 MG tablet Commonly known as:  RELAFEN Take 750 mg by mouth 2 (two) times daily as needed.   NON FORMULARY [place] Essential Oils:  Eucalyptus, [place], Lemongrass, Frankincense, [place], [place]   ondansetron 4 MG tablet Commonly known as:  ZOFRAN TAKE 1 TABLET BY MOUTH EVERY 6 HOURS AS NEEDED FOR NAUSEA   phenylephrine 10 MG Tabs tablet Commonly known as:  SUDAFED PE Take 10 mg by mouth every 4 (four) hours as needed.   SYNTHROID 50 MCG tablet Generic drug:  levothyroxine TK 1 T PO QAM FOR THYROID  VITAMIN B COMPLEX PO Take by mouth.   vitamin C 500 MG tablet Commonly known as:  ASCORBIC ACID Take 500 mg by mouth daily.   Vitamin D (Ergocalciferol) 50000 units Caps capsule Commonly known as:  DRISDOL TAKE 1 CAPSULE BY MOUTH ONCE A MONTH      Allergies  Allergen Reactions  . Latex   . Neomycin-Bacitracin Zn-Polymyx     REACTION: rash

## 2017-02-15 ENCOUNTER — Encounter: Payer: Self-pay | Admitting: Adult Health

## 2017-02-15 NOTE — Telephone Encounter (Signed)
Referral placed.  email sent to pt to make aware.  Nothing further needed.

## 2017-02-15 NOTE — Addendum Note (Signed)
Addended by: Len Blalock on: 02/15/2017 08:58 AM   Modules accepted: Orders

## 2017-02-16 ENCOUNTER — Telehealth: Payer: Self-pay

## 2017-02-16 DIAGNOSIS — G4710 Hypersomnia, unspecified: Secondary | ICD-10-CM

## 2017-02-16 NOTE — Telephone Encounter (Signed)
[facility] needs an order to D/C CPAP. Please place order.  I will fax order and notes to [facility].

## 2017-02-17 NOTE — Telephone Encounter (Signed)
patient subjectively felt no benefit from CPAP use, remained with severe hypersomnia. CD

## 2017-02-17 NOTE — Addendum Note (Signed)
Addended by: Larey Seat on: 02/17/2017 10:55 AM   Modules accepted: Orders

## 2017-02-18 ENCOUNTER — Other Ambulatory Visit: Payer: Self-pay | Admitting: Internal Medicine

## 2017-02-20 ENCOUNTER — Telehealth: Payer: Self-pay

## 2017-02-20 NOTE — Telephone Encounter (Signed)
LM that testing came back negative. Left call back number for questions. Also stated that she can contact us via [place]

## 2017-02-20 NOTE — Telephone Encounter (Signed)
I completed a pa for modafinil. Sent to [facility]. Should have a determination in 1-3 business days.

## 2017-02-20 NOTE — Telephone Encounter (Signed)
I faxed in orders to [facility]

## 2017-02-21 NOTE — Telephone Encounter (Signed)
PA for modafinil was approved by [facility] 02/20/17 to 02/20/2018. PA 18- [id number] JM.  [facility] in [place] notified.

## 2017-02-22 ENCOUNTER — Other Ambulatory Visit: Payer: Self-pay | Admitting: *Deleted

## 2017-02-22 MED ORDER — HYDROXYCHLOROQUINE SULFATE 200 MG PO TABS: 200.0000 mg | ORAL_TABLET | Freq: Two times a day (BID) | ORAL | 3 refills | Status: DC

## 2017-02-22 NOTE — Telephone Encounter (Signed)
Refill request received via fax  Last Visit: 12/08/16 Next Visit: 06/02/17 Labs: 12/08/16 WNL PLQ Eye Exam: 07/12/16 WNL  Okay to refill PLQ?

## 2017-02-22 NOTE — Telephone Encounter (Signed)
ok 

## 2017-03-09 ENCOUNTER — Other Ambulatory Visit: Payer: Self-pay | Admitting: Rheumatology

## 2017-03-09 MED ORDER — METHOTREXATE SODIUM CHEMO INJECTION 50 MG/2ML: 17.5000 mg | INTRAMUSCULAR | 0 refills | Status: DC

## 2017-03-09 NOTE — Telephone Encounter (Signed)
ok 

## 2017-03-09 NOTE — Telephone Encounter (Signed)
Last Visit: 12/08/16 Next Visit: 06/02/17 Labs: 12/08/16 WNL  Patient states she is going to be leaving town today and will be back in town next Friday. Will update labs next Friday.   Okay for to refill 30 day Supply MTX?

## 2017-03-09 NOTE — Telephone Encounter (Signed)
[facility] Please call to clarify.  [phone]

## 2017-03-19 ENCOUNTER — Other Ambulatory Visit: Payer: Self-pay | Admitting: Internal Medicine

## 2017-03-24 ENCOUNTER — Other Ambulatory Visit: Payer: Self-pay | Admitting: *Deleted

## 2017-03-24 DIAGNOSIS — Z79899 Other long term (current) drug therapy: Secondary | ICD-10-CM

## 2017-03-24 LAB — CBC WITH DIFFERENTIAL/PLATELET
BASOS ABS: 0 {cells}/uL (ref 0–200)
Basophils Relative: 0 %
EOS PCT: 2 %
Eosinophils Absolute: 168 cells/uL (ref 15–500)
HCT: 44.6 % (ref 35.0–45.0)
Hemoglobin: 14.8 g/dL (ref 11.7–15.5)
LYMPHS PCT: 33 %
Lymphs Abs: 2772 cells/uL (ref 850–3900)
MCH: 30 pg (ref 27.0–33.0)
MCHC: 33.2 g/dL (ref 32.0–36.0)
MCV: 90.5 fL (ref 80.0–100.0)
MONOS PCT: 6 %
MPV: 9.4 fL (ref 7.5–12.5)
Monocytes Absolute: 504 cells/uL (ref 200–950)
NEUTROS ABS: 4956 {cells}/uL (ref 1500–7800)
NEUTROS PCT: 59 %
PLATELETS: 319 10*3/uL (ref 140–400)
RBC: 4.93 MIL/uL (ref 3.80–5.10)
RDW: 14.2 % (ref 11.0–15.0)
WBC: 8.4 10*3/uL (ref 3.8–10.8)

## 2017-03-24 LAB — COMPLETE METABOLIC PANEL WITHOUT GFR
ALT: 13 U/L (ref 6–29)
AST: 22 U/L (ref 10–35)
Albumin: 4 g/dL (ref 3.6–5.1)
Alkaline Phosphatase: 73 U/L (ref 33–130)
BUN: 17 mg/dL (ref 7–25)
CO2: 30 mmol/L (ref 20–31)
Calcium: 9.3 mg/dL (ref 8.6–10.4)
Chloride: 102 mmol/L (ref 98–110)
Creat: 0.9 mg/dL (ref 0.50–1.05)
GFR, Est African American: 82 mL/min
GFR, Est Non African American: 71 mL/min
Glucose, Bld: 107 mg/dL — ABNORMAL HIGH (ref 65–99)
Potassium: 3.9 mmol/L (ref 3.5–5.3)
Sodium: 140 mmol/L (ref 135–146)
Total Bilirubin: 0.4 mg/dL (ref 0.2–1.2)
Total Protein: 6.5 g/dL (ref 6.1–8.1)

## 2017-03-26 NOTE — Progress Notes (Signed)
WNL

## 2017-04-24 ENCOUNTER — Other Ambulatory Visit: Payer: Self-pay | Admitting: Internal Medicine

## 2017-04-24 ENCOUNTER — Other Ambulatory Visit: Payer: Self-pay | Admitting: Rheumatology

## 2017-04-25 NOTE — Telephone Encounter (Signed)
Last Visit: 12/08/16 Next Visit: 06/02/17 Labs: 03/24/17 WNL  Okay to refill MTX?

## 2017-04-25 NOTE — Telephone Encounter (Signed)
ok 

## 2017-05-12 ENCOUNTER — Other Ambulatory Visit: Payer: Self-pay | Admitting: Rheumatology

## 2017-05-12 NOTE — Telephone Encounter (Signed)
ok 

## 2017-05-12 NOTE — Telephone Encounter (Signed)
Last Visit: 12/08/16 Next Visit: 06/02/17 Labs: 03/24/17 WNL  Okay to refill MTX?

## 2017-05-23 ENCOUNTER — Other Ambulatory Visit: Payer: Self-pay | Admitting: Internal Medicine

## 2017-05-30 ENCOUNTER — Other Ambulatory Visit: Payer: Self-pay | Admitting: Rheumatology

## 2017-05-30 NOTE — Telephone Encounter (Signed)
ok 

## 2017-05-30 NOTE — Telephone Encounter (Signed)
Last Visit: 12/08/16 Next Visit: 06/02/17  Okay to refill syringes?

## 2017-06-01 NOTE — Progress Notes (Signed)
Office Visit Note  Patient: Misty Curtis             Date of Birth: Jul 14, 1961           MRN: [id number]             PCP: Helen Hashimoto., MD Referring: Leonides Sake, MD Visit Date: 06/02/2017 Occupation: '@GUAROCC' @    Subjective:  Sicca   History of Present Illness: Misty Curtis is a 56 y.o. female with history of autoimmune disease. She states that she sprained her left Achilles tendon about a month ago. It is improving. She also has been very active in the house and she is having trouble with her right carpal tunnel. She continues to have some problems with sicca symptoms for which she is using over-the-counter products. Her Raynauds is not as active currently.   Activities of Daily Living:  Patient reports morning stiffness for 30 minutes.   Patient Reports nocturnal pain.  Difficulty dressing/grooming: Denies Difficulty climbing stairs: Reports Difficulty getting out of chair: Denies Difficulty using hands for taps, buttons, cutlery, and/or writing: Denies   Review of Systems  Constitutional: Positive for fatigue. Negative for night sweats, weight gain, weight loss and weakness.  HENT: Positive for mouth dryness. Negative for mouth sores, trouble swallowing, trouble swallowing and nose dryness.   Eyes: Positive for dryness. Negative for pain, redness and visual disturbance.  Respiratory: Negative for cough, shortness of breath and difficulty breathing.   Cardiovascular: Negative for chest pain, palpitations, hypertension, irregular heartbeat and swelling in legs/feet.  Gastrointestinal: Negative for blood in stool, constipation and diarrhea.  Endocrine: Negative for increased urination.  Genitourinary: Negative for vaginal dryness.  Musculoskeletal: Positive for arthralgias, joint pain and morning stiffness. Negative for joint swelling, myalgias, muscle weakness, muscle tenderness and myalgias.  Skin: Positive for color change. Negative for rash, hair loss, skin  tightness, ulcers and sensitivity to sunlight.  Allergic/Immunologic: Negative for susceptible to infections.  Neurological: Negative for dizziness, memory loss and night sweats.  Hematological: Negative for swollen glands.  Psychiatric/Behavioral: Negative for depressed mood and sleep disturbance. The patient is not nervous/anxious.     PMFS History:  Patient Active Problem List   Diagnosis Date Noted  . Acquired hypothyroidism 12/08/2016  . Autoimmune disease ([place]) 12/06/2016  . Other fatigue 12/06/2016  . Raynaud's disease without gangrene 12/06/2016  . High risk medication use 12/06/2016  . Allergic asthma, mild intermittent, uncomplicated 88/10/314  . History of hypertension 12/06/2016  . OSA (obstructive sleep apnea) 11/08/2016  . Excessive daytime sleepiness 07/26/2016  . Hypersomnia with sleep apnea 07/26/2016  . Snoring 07/26/2016  . Laryngitis 11/10/2012  . Postnasal drip 05/02/2012  . Acute sinusitis, unspecified 10/24/2011  . HYPERTENSION 12/14/2009  . BRONCHITIS 12/14/2009  . LUPUS 12/14/2009  . History of melanoma 12/14/2009  . Seasonal and perennial allergic rhinitis 12/14/2009    Past Medical History:  Diagnosis Date  . Allergic rhinitis    skin test 01-18-10  . Bronchitis   . Lupus (systemic lupus erythematosus) (HCC)    joints and skin rash. Dr. Estanislado Pandy  . Melanoma ([place])   . Rhinosinusitis   . Skin cancer     Family History  Problem Relation Age of Onset  . Allergies Father   . Heart disease Father        bacterial endocarditis  . Cancer Maternal Grandmother    Past Surgical History:  Procedure Laterality Date  . ABDOMINAL HYSTERECTOMY    . KNEE ARTHROPLASTY    .  skin cancer extraction     Social History   Social History Narrative  . No narrative on file     Objective: Vital Signs: BP (!) 162/81 (BP Location: Left Arm, Patient Position: Sitting, Cuff Size: Normal)   Pulse 81   Resp 15   Ht '5\' 3"'  (1.6 m)   Wt 191 lb (86.6 kg)   BMI  33.83 kg/m    Physical Exam  Constitutional: She is oriented to person, place, and time. She appears well-developed and well-nourished.  HENT:  Head: Normocephalic and atraumatic.  Eyes: Conjunctivae and EOM are normal.  Neck: Normal range of motion.  Cardiovascular: Normal rate, regular rhythm, normal heart sounds and intact distal pulses.   Pulmonary/Chest: Effort normal and breath sounds normal.  Abdominal: Soft. Bowel sounds are normal.  Lymphadenopathy:    She has no cervical adenopathy.  Neurological: She is alert and oriented to person, place, and time.  Skin: Skin is warm and dry. Capillary refill takes less than 2 seconds.  Psychiatric: She has a normal mood and affect. Her behavior is normal.  Nursing note and vitals reviewed.    Musculoskeletal Exam: C-spine and thoracic lumbar spine good range of motion. Shoulder joints elbow joints wrist joint MCPs PIPs DIPs with good range of motion. No synovitis was noted. Hip joints knee joints ankles MTPs PIPs DIPs with good range of motion with no synovitis.  CDAI Exam: No CDAI exam completed.    Investigation: No additional findings. Orders Only on 03/24/2017  Component Date Value Ref Range Status  . WBC 03/24/2017 8.4  3.8 - 10.8 K/uL Final  . RBC 03/24/2017 4.93  3.80 - 5.10 MIL/uL Final  . Hemoglobin 03/24/2017 14.8  11.7 - 15.5 g/dL Final  . HCT 03/24/2017 44.6  35.0 - 45.0 % Final  . MCV 03/24/2017 90.5  80.0 - 100.0 fL Final  . MCH 03/24/2017 30.0  27.0 - 33.0 pg Final  . MCHC 03/24/2017 33.2  32.0 - 36.0 g/dL Final  . RDW 03/24/2017 14.2  11.0 - 15.0 % Final  . Platelets 03/24/2017 319  140 - 400 K/uL Final  . MPV 03/24/2017 9.4  7.5 - 12.5 fL Final  . Neutro Abs 03/24/2017 4956  1,500 - 7,800 cells/uL Final  . Lymphs Abs 03/24/2017 2772  850 - 3,900 cells/uL Final  . Monocytes Absolute 03/24/2017 504  200 - 950 cells/uL Final  . Eosinophils Absolute 03/24/2017 168  15 - 500 cells/uL Final  . Basophils Absolute  03/24/2017 0  0 - 200 cells/uL Final  . Neutrophils Relative % 03/24/2017 59  % Final  . Lymphocytes Relative 03/24/2017 33  % Final  . Monocytes Relative 03/24/2017 6  % Final  . Eosinophils Relative 03/24/2017 2  % Final  . Basophils Relative 03/24/2017 0  % Final  . Smear Review 03/24/2017 Criteria for review not met   Final  . Sodium 03/24/2017 140  135 - 146 mmol/L Final  . Potassium 03/24/2017 3.9  3.5 - 5.3 mmol/L Final  . Chloride 03/24/2017 102  98 - 110 mmol/L Final  . CO2 03/24/2017 30  20 - 31 mmol/L Final  . Glucose, Bld 03/24/2017 107* 65 - 99 mg/dL Final  . BUN 03/24/2017 17  7 - 25 mg/dL Final  . Creat 03/24/2017 0.90  0.50 - 1.05 mg/dL Final   Comment:   For patients > or = 58 years of age: The upper reference limit for Creatinine is approximately 13% higher for people identified as  African-American.     . Total Bilirubin 03/24/2017 0.4  0.2 - 1.2 mg/dL Final  . Alkaline Phosphatase 03/24/2017 73  33 - 130 U/L Final  . AST 03/24/2017 22  10 - 35 U/L Final  . ALT 03/24/2017 13  6 - 29 U/L Final  . Total Protein 03/24/2017 6.5  6.1 - 8.1 g/dL Final  . Albumin 03/24/2017 4.0  3.6 - 5.1 g/dL Final  . Calcium 03/24/2017 9.3  8.6 - 10.4 mg/dL Final  . GFR, Est African American 03/24/2017 82  >=60 mL/min Final  . GFR, Est Non African American 03/24/2017 71  >=60 mL/min Final  Orders Only on 12/22/2016  Component Date Value Ref Range Status  . Total Protein, Serum Electrophores* 12/22/2016 6.6  6.1 - 8.1 g/dL Final  . Albumin ELP 12/22/2016 4.0  3.8 - 4.8 g/dL Final  . Alpha-1-Globulin 12/22/2016 0.3  0.2 - 0.3 g/dL Final  . Alpha-2-Globulin 12/22/2016 0.7  0.5 - 0.9 g/dL Final  . Beta Globulin 12/22/2016 0.4  0.4 - 0.6 g/dL Final  . Beta 2 12/22/2016 0.3  0.2 - 0.5 g/dL Final  . Gamma Globulin 12/22/2016 0.9  0.8 - 1.7 g/dL Final  . Abnormal Protein Band1 12/22/2016 NOT DET  g/dL Final  . SPE Interp. 12/22/2016 SEE NOTE   Final   Comment: Normal Electrophoretic  Pattern Reviewed by Odis Hollingshead, MD, PhD, FCAP (Electronic Signature on File)   . Abnormal Protein Band2 12/22/2016 NOT DET  g/dL Final  . Abnormal Protein Band3 12/22/2016 NOT DET  g/dL Final  . Immunofix Electr Int 12/22/2016 SEE NOTE   Final   Comment: Normal pattern. No monoclonal proteins detected. Reviewed by Odis Hollingshead, MD, PhD, FCAP (Electronic Signature on File)   Office Visit on 12/08/2016  Component Date Value Ref Range Status  . WBC 12/08/2016 8.8  3.8 - 10.8 K/uL Final  . RBC 12/08/2016 4.94  3.80 - 5.10 MIL/uL Final  . Hemoglobin 12/08/2016 15.3  11.7 - 15.5 g/dL Final  . HCT 12/08/2016 45.3* 35.0 - 45.0 % Final  . MCV 12/08/2016 91.7  80.0 - 100.0 fL Final  . MCH 12/08/2016 31.0  27.0 - 33.0 pg Final  . MCHC 12/08/2016 33.8  32.0 - 36.0 g/dL Final  . RDW 12/08/2016 14.3  11.0 - 15.0 % Final  . Platelets 12/08/2016 334  140 - 400 K/uL Final  . MPV 12/08/2016 9.4  7.5 - 12.5 fL Final  . Neutro Abs 12/08/2016 5280  1,500 - 7,800 cells/uL Final  . Lymphs Abs 12/08/2016 2640  850 - 3,900 cells/uL Final  . Monocytes Absolute 12/08/2016 616  200 - 950 cells/uL Final  . Eosinophils Absolute 12/08/2016 176  15 - 500 cells/uL Final  . Basophils Absolute 12/08/2016 88  0 - 200 cells/uL Final  . Neutrophils Relative % 12/08/2016 60  % Final  . Lymphocytes Relative 12/08/2016 30  % Final  . Monocytes Relative 12/08/2016 7  % Final  . Eosinophils Relative 12/08/2016 2  % Final  . Basophils Relative 12/08/2016 1  % Final  . Smear Review 12/08/2016 Criteria for review not met   Final  . Sodium 12/08/2016 142  135 - 146 mmol/L Final  . Potassium 12/08/2016 4.1  3.5 - 5.3 mmol/L Final  . Chloride 12/08/2016 102  98 - 110 mmol/L Final  . CO2 12/08/2016 28  20 - 31 mmol/L Final  . Glucose, Bld 12/08/2016 107* 65 - 99 mg/dL Final  . BUN 12/08/2016  12  7 - 25 mg/dL Final  . Creat 12/08/2016 0.98  0.50 - 1.05 mg/dL Final   Comment:   For patients > or = 58 years of  age: The upper reference limit for Creatinine is approximately 13% higher for people identified as African-American.     . Total Bilirubin 12/08/2016 0.4  0.2 - 1.2 mg/dL Final  . Alkaline Phosphatase 12/08/2016 79  33 - 130 U/L Final  . AST 12/08/2016 22  10 - 35 U/L Final  . ALT 12/08/2016 11  6 - 29 U/L Final  . Total Protein 12/08/2016 6.7  6.1 - 8.1 g/dL Final  . Albumin 12/08/2016 4.1  3.6 - 5.1 g/dL Final  . Calcium 12/08/2016 9.4  8.6 - 10.4 mg/dL Final  . GFR, Est African American 12/08/2016 74  >=60 mL/min Final  . GFR, Est Non African American 12/08/2016 64  >=60 mL/min Final  . Color, Urine 12/08/2016 YELLOW  YELLOW Final  . APPearance 12/08/2016 CLEAR  CLEAR Final  . Specific Gravity, Urine 12/08/2016 1.010  1.001 - 1.035 Final  . pH 12/08/2016 8.0  5.0 - 8.0 Final  . Glucose, UA 12/08/2016 NEGATIVE  NEGATIVE Final  . Bilirubin Urine 12/08/2016 NEGATIVE  NEGATIVE Final  . Ketones, ur 12/08/2016 NEGATIVE  NEGATIVE Final  . Hgb urine dipstick 12/08/2016 NEGATIVE  NEGATIVE Final  . Protein, ur 12/08/2016 NEGATIVE  NEGATIVE Final  . Nitrite 12/08/2016 NEGATIVE  NEGATIVE Final  . Leukocytes, UA 12/08/2016 NEGATIVE  NEGATIVE Final  . ds DNA Ab 12/08/2016 1  IU/mL Final   Comment:                                 IU/mL       Interpretation                               < or = 4    Negative                               5-9         Indeterminate                               > or = 10   Positive     . C3 Complement 12/08/2016 146  90 - 180 mg/dL Final  . C4 Complement 12/08/2016 28  16 - 47 mg/dL Final  . Sed Rate 12/08/2016 4  0 - 30 mm/hr Final  . Rhuematoid fact SerPl-aCnc 12/08/2016 14* <14 IU/mL Final  . Total Protein, Serum Electrophores* 12/08/2016 6.9  6.1 - 8.1 g/dL Final  . Albumin ELP 12/08/2016 3.9  3.8 - 4.8 g/dL Final  . Alpha-1-Globulin 12/08/2016 0.5* 0.2 - 0.3 g/dL Final  . Alpha-2-Globulin 12/08/2016 0.8  0.5 - 0.9 g/dL Final  . Beta Globulin 12/08/2016  0.5  0.4 - 0.6 g/dL Final  . Beta 2 12/08/2016 0.3  0.2 - 0.5 g/dL Final  . Gamma Globulin 12/08/2016 0.9  0.8 - 1.7 g/dL Final  . Abnormal Protein Band1 12/08/2016 NOT DET  g/dL Final  . SPE Interp. 12/08/2016 SEE NOTE   Final   Comment: A poorly defined band of restricted protein mobility is detected in the gamma globulins. It is unlikely  this may represent a monoclonal protein, however, immunofixation analysis is available if clinically indicated. Reviewed by Odis Hollingshead, MD, PhD, FCAP (Electronic Signature on File)   . Abnormal Protein Band2 12/08/2016 NOT DET  g/dL Final  . Abnormal Protein Band3 12/08/2016 NOT DET  g/dL Final     Imaging: No results found.  Speciality Comments: No specialty comments available.    Procedures:  No procedures performed Allergies: Latex and Neomycin-bacitracin zn-polymyx   Assessment / Plan:     Visit Diagnoses: Autoimmune disease ([place]) -  History of positive ANA, DS DNA, Raynauds, fatigue, oral ulcers, nasal ulcers, arthralgias,sicca. She is doing fairly well except for mild sicca symptoms. For which she is using over-the-counter products. She is tolerating Plaquenil and methotrexate well. She's had no recurrence of oral ulcers and nasal ulcers. Her Raynauds is not very active currently.  High risk medication use - Plaquenil 200 mg a.m. and 100 mg p.m. , methotrexate 0.7 mL subcutaneous every week, folic acid 2 mg by mouth daily. I'll check her labs today and then every 3 months to monitor for drug toxicity. She's been also advised to get eye examination.    Raynaud's disease : Not active  Left Achilles tendon sprain: Gradually improving  Right carpal tunnel syndrome: Patient has been using carpal tunnel brace. I offered cortisone injection but she declined.  Other fatigue  History of melanoma: Followed up by dermatology  History of hypertension: Her blood pressure was elevated today she believes is secondary to increased stress.  I've advised her to monitor blood pressure closely.  Allergic asthma, mild intermittent, uncomplicated     Orders: No orders of the defined types were placed in this encounter.  No orders of the defined types were placed in this encounter.   Face-to-face time spent with patient was 30 minutes. 50% of time was spent in counseling and coordination of care.  Follow-Up Instructions: Return for Autoimmune disease.   Bo Merino, MD  Note - This record has been created using Editor, commissioning.  Chart creation errors have been sought, but may not always  have been located. Such creation errors do not reflect on  the standard of medical care.

## 2017-06-02 ENCOUNTER — Encounter: Payer: Self-pay | Admitting: Rheumatology

## 2017-06-02 ENCOUNTER — Ambulatory Visit (INDEPENDENT_AMBULATORY_CARE_PROVIDER_SITE_OTHER): Payer: BC Managed Care – PPO | Admitting: Rheumatology

## 2017-06-02 VITALS — BP 162/81 | HR 81 | Resp 15 | Ht 63.0 in | Wt 191.0 lb

## 2017-06-02 DIAGNOSIS — Z85820 Personal history of malignant melanoma of skin: Secondary | ICD-10-CM | POA: Diagnosis not present

## 2017-06-02 DIAGNOSIS — R5383 Other fatigue: Secondary | ICD-10-CM | POA: Diagnosis not present

## 2017-06-02 DIAGNOSIS — Z79899 Other long term (current) drug therapy: Secondary | ICD-10-CM | POA: Diagnosis not present

## 2017-06-02 DIAGNOSIS — Z8679 Personal history of other diseases of the circulatory system: Secondary | ICD-10-CM

## 2017-06-02 DIAGNOSIS — I7300 Raynaud's syndrome without gangrene: Secondary | ICD-10-CM

## 2017-06-02 DIAGNOSIS — D8989 Other specified disorders involving the immune mechanism, not elsewhere classified: Secondary | ICD-10-CM | POA: Diagnosis not present

## 2017-06-02 DIAGNOSIS — M359 Systemic involvement of connective tissue, unspecified: Secondary | ICD-10-CM

## 2017-06-02 DIAGNOSIS — J4520 Mild intermittent asthma, uncomplicated: Secondary | ICD-10-CM | POA: Diagnosis not present

## 2017-06-02 LAB — CBC WITH DIFFERENTIAL/PLATELET
BASOS PCT: 0 %
Basophils Absolute: 0 cells/uL (ref 0–200)
EOS ABS: 148 {cells}/uL (ref 15–500)
Eosinophils Relative: 2 %
HEMATOCRIT: 42.7 % (ref 35.0–45.0)
HEMOGLOBIN: 14.3 g/dL (ref 11.7–15.5)
LYMPHS ABS: 2442 {cells}/uL (ref 850–3900)
LYMPHS PCT: 33 %
MCH: 30.6 pg (ref 27.0–33.0)
MCHC: 33.5 g/dL (ref 32.0–36.0)
MCV: 91.2 fL (ref 80.0–100.0)
MONO ABS: 518 {cells}/uL (ref 200–950)
MPV: 9.2 fL (ref 7.5–12.5)
Monocytes Relative: 7 %
Neutro Abs: 4292 cells/uL (ref 1500–7800)
Neutrophils Relative %: 58 %
Platelets: 298 10*3/uL (ref 140–400)
RBC: 4.68 MIL/uL (ref 3.80–5.10)
RDW: 14.5 % (ref 11.0–15.0)
WBC: 7.4 10*3/uL (ref 3.8–10.8)

## 2017-06-02 LAB — COMPLETE METABOLIC PANEL WITH GFR
ALT: 12 U/L (ref 6–29)
AST: 20 U/L (ref 10–35)
Albumin: 4.1 g/dL (ref 3.6–5.1)
Alkaline Phosphatase: 82 U/L (ref 33–130)
BILIRUBIN TOTAL: 0.4 mg/dL (ref 0.2–1.2)
BUN: 19 mg/dL (ref 7–25)
CALCIUM: 9.5 mg/dL (ref 8.6–10.4)
CO2: 26 mmol/L (ref 20–31)
CREATININE: 1.08 mg/dL — AB (ref 0.50–1.05)
Chloride: 102 mmol/L (ref 98–110)
GFR, EST AFRICAN AMERICAN: 66 mL/min (ref >=60)
GFR, Est Non African American: 57 mL/min — ABNORMAL LOW (ref >=60)
Glucose, Bld: 109 mg/dL — ABNORMAL HIGH (ref 65–99)
Potassium: 3.9 mmol/L (ref 3.5–5.3)
Sodium: 142 mmol/L (ref 135–146)
TOTAL PROTEIN: 6.3 g/dL (ref 6.1–8.1)

## 2017-06-02 NOTE — Patient Instructions (Addendum)
Standing Labs We placed an order today for your standing lab work.    Please come back and get your standing labs in September 2018 and every 3 months  We have open lab Monday through Friday from 8:30-11:30 AM and 1:30-4 PM at the office of Dr. Tresa Moore, PA.   The office is located at [address], [place], [place],  [address] No appointment is necessary.   Labs are drawn by [facility].  You may receive a bill from [facility] for your lab work. If you have any questions regarding directions or hours of operation,  please call [phone].

## 2017-06-04 NOTE — Progress Notes (Signed)
Mild increase in creatinine. Patient was doing well during her last visit. Please advise her to decrease her methotrexate to 0.5 mL subcutaneous every week. We will continue to monitor her labs.

## 2017-06-06 ENCOUNTER — Telehealth: Payer: Self-pay | Admitting: *Deleted

## 2017-06-06 NOTE — Telephone Encounter (Signed)
-----   Message from Bo Merino, MD sent at 06/04/2017  9:34 PM EDT ----- Mild increase in creatinine. Patient was doing well during her last visit. Please advise her to decrease her methotrexate to 0.5 mL subcutaneous every week. We will continue to monitor her labs.

## 2017-06-06 NOTE — Telephone Encounter (Signed)
Patient advised of labs and recommendations. Patient verbalized understanding.

## 2017-06-06 NOTE — Telephone Encounter (Signed)
Attempted to contact the patient and left message for patient to call the office.  

## 2017-06-19 ENCOUNTER — Other Ambulatory Visit: Payer: Self-pay | Admitting: Internal Medicine

## 2017-06-19 ENCOUNTER — Other Ambulatory Visit: Payer: Self-pay | Admitting: Rheumatology

## 2017-06-19 NOTE — Telephone Encounter (Signed)
Last Visit:  06/02/17 Next Visit: 11/02/17 Labs: 03/24/17 WNL PLQ Eye Exam: 07/12/16 WNL  Okay to refill per Dr. Estanislado Pandy

## 2017-07-22 ENCOUNTER — Other Ambulatory Visit: Payer: Self-pay | Admitting: Rheumatology

## 2017-07-24 NOTE — Telephone Encounter (Signed)
ok 

## 2017-07-24 NOTE — Telephone Encounter (Addendum)
Last Visit:  06/02/17 Next Visit: 11/02/17 Labs: 03/24/17 WNL PLQ Eye Exam: 07/12/16 WNL  Left message for patient to call back with information on updated PLQ eye exam.   Okay to refill 30 day supply PLQ?

## 2017-07-27 ENCOUNTER — Encounter: Payer: Self-pay | Admitting: Adult Health

## 2017-07-27 ENCOUNTER — Ambulatory Visit (INDEPENDENT_AMBULATORY_CARE_PROVIDER_SITE_OTHER): Payer: BC Managed Care – PPO | Admitting: Adult Health

## 2017-07-27 VITALS — BP 140/85 | HR 72 | Ht 63.5 in | Wt 192.0 lb

## 2017-07-27 DIAGNOSIS — G4711 Idiopathic hypersomnia with long sleep time: Secondary | ICD-10-CM

## 2017-07-27 NOTE — Patient Instructions (Addendum)
  Continue Provigil  If your symptoms worsen or you develop new symptoms please let us know.

## 2017-07-27 NOTE — Progress Notes (Signed)
PATIENT: Misty Curtis DOB: October 14, 1961  REASON FOR VISIT: follow up- idiopathic hypersomnia HISTORY FROM: patient  HISTORY OF PRESENT ILLNESS: Today 07/27/17 Misty Curtis is a 56 year old female with a history of idiopathic hypersomnia. She returns today for follow-up visit she is currently taking Provigil. In the past she did try CPAP therapy but offered no benefit for her daytime sleepiness. She also had HLA narcolepsy testing which was negative. She reports that Provigil does offer her good benefit however she did receive better benefit from nuvigil. However this is not covered for her. She denies falling asleep throughout the day. Denies any difficulty driving. She states typically by 7 PM she is getting sleepy. She typically goes to bed around 10 PM and wakes up at 5:30 AM. She does not consume a lot of caffeine. She states right now she is taking 100 mg of Provigil daily. She is getting ready to start teaching again and she is considering taking half a tablet in the morning and then a half a tablet at lunchtime. She returns today for an evaluation.   HISTORY Copied from dr. Edwena Felty notes: Misty Curtis has been treated for many years with modafinil in her case was armodafinil. She has still a prescription for armodafinil at this time at home but her insurance has made it difficult for her to obtain refills. They expect a sleep study to document that she has narcolepsy or another sleep disordered justifying the expense of the medication. Misty Curtis suffers from autoimmune disease and is therefore followed by her rheumatologist. Lupus, Reynaud's and Sjoegren's sicca  ; She has dry eyes, hypothyroidism, she is treated with chloroquine 200 mg in the morning and 100 mm times at night, methotrexate, Symbicort, fluticasone, clotrimazole, Sudafed, magnesium and Magic mouthwash. She has also a history of remote even in warmer weather. She has a history of melanoma of the for head and the melanoma from the left  lower back removed recently by Dr. Crista Luria. Misty Curtis works as a Pharmacist, hospital in [place], and [place]. He arrives at school at 7:30 and sometimes doesn't leave to the late evening hours. 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  Sleep habits are as follows: 10 PM is her bedtime and she rises at 5.45 am, GETTING 6 HOURS OF SLEEP, ON WEEKENDS, 7.5 . She describes her bedroom is cool, quiet and dark. She usually sleeps on her left side, she uses 2 pillows to support the head and 1 for body support. She usually has no delayed onset of sleep as promptly asleep. She will have one bathroom break usually around 2 AM, but after that also can go back to sleep immediately. She may dream a lot, no nightmarish, no sleep paralysis, no dream intrusion. Palpitations or diaphoresis are also denied.  He does not have a headache in the morning. She is snoring when she is very exhausted very tired but not nightly. Her husband who shares the same bedroom has been telling her this. He has not noticed any apneas. 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  Her sleepiness begun  In her twenties, perhaps in high school , preceding the Lupus diagnoses.   Sleep medical history and family sleep history: She is not aware of any blood relation similar problems to stay awake in daytime. Her younger sister has Hashimoto's thyroiditis, brother died in 79 from a heat stroke. An older sister has hypothyroidism and is excessively gaining weight. Mother has  no known autoimmune disease father neither. Social history: married, gifted Pharmacist, hospital, No tobacco products ever, ETOH 2 glasses a month, caffeine - sweet tea 3 glasses a day. No coffee, no sodas.    Interval history from 11/08/2016. I have pleasant of seeing Misty Curtis today in a revisit after her baseline polysomnography for 10/09/2016. The patient was diagnosed with very mild apnea of 5.3 per hour but during REM sleep with apnea exacerbated  to 10.9 per hour there was also slight accentuation with supine sleep position. On her back her AHI was 9.3. She also had frequent periodic limb movements and woke up about 3 times per hour of sleep from periodic limb movements this was still a valid study for an M SLT to follow. Epworth sleepiness score was endorsed at 18 points, after a total sleep time of 393 minutes the patient was invited to stay for 5 naps. She fell asleep in each of her first 4 naps with a total mean sleep latency of 13 minutes and a REM latency of 75 minutes. Reevaluating her sleep study I only using the first 4 naps still created a mean sleep latency of about 8 minutes without sleep REM onsets. 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  Interval history from 02/09/2017, As the pleasure of seeing Misty Curtis today for compliance visit. I have last seen her 3 months ago when she was still endorsing excessive daytime sleepiness. The patient has now continued with CPAP and an 97% compliance has been documented an average user time of 6 hours and 3 minutes, she is using an AutoSet between 5 and 10 cm water pressure with 1 cm expiratory pressure relief at an residual AHI of 1.6. Numerically these are great results. Her 95th percentile pressure need is 9.9 cm so it is well within her current settings- but she remains daytime sleepy and endorsed the Epworth sleepiness score at 14 points, the fatigue severity score at 50 points,  REVIEW OF SYSTEMS: Out of a complete 14 system review of symptoms, the patient complains only of the following symptoms, and all other reviewed systems are negative.  Diarrhea, excessive thirst, eye itching, joint pain, joint swelling, environmental allergies  ALLERGIES: Allergies  Allergen Reactions  . Latex Other (See Comments)  . Neomycin-Bacitracin Zn-Polymyx     REACTION: rash    HOME MEDICATIONS: Outpatient Medications Prior to Visit  Medication Sig Dispense Refill  . Armodafinil 250 MG tablet  TK 1 T PO  QAM    . Ascorbic Acid (VITAMIN C) 500 MG tablet Take 500 mg by mouth daily.      . B Complex Vitamins (VITAMIN B COMPLEX PO) Take by mouth.    . B-D TB SYRINGE 1CC/27GX1/2" 27G X 1/2" 1 ML MISC USE 1 SYRINGE EVERY WEEK AS DIRECTED 12 each 0  . BIOTENE DRY MOUTH (BIOTENE) LIQD Place 1 application onto teeth as needed.      . clotrimazole-betamethasone (LOTRISONE) cream Apply topically 2 (two) times daily. 30 g prn  . Digestive Enzymes (DIGESTIVE ENZYME PO) Take by mouth.    . fluorouracil (EFUDEX) 5 % cream APP AA D FOR 14 DAYS  0  . fluticasone (FLONASE) 50 MCG/ACT nasal spray SHAKE WELL AND USE 1 TO 2 SPRAYS IN EACH NOSTRIL 1 TO 2 TIMES DAILY 16 g prn  . folic acid (FOLVITE) 1 MG tablet TAKE 2 TABLETS BY MOUTH ONCE A DAY 180 tablet 4  . hydroxychloroquine (PLAQUENIL) 200 MG tablet TAKE 1 TABLET(200 MG) BY MOUTH TWICE DAILY  60 tablet 0  . lactase (LACTAID) 3000 UNITS tablet Take by mouth as needed.     Marland Kitchen levothyroxine (SYNTHROID) 50 MCG tablet TK 1 T PO QAM FOR THYROID    . levothyroxine (SYNTHROID) 50 MCG tablet     . lisinopril (PRINIVIL,ZESTRIL) 10 MG tablet Take 5 mg by mouth daily.    Marland Kitchen loratadine (CLARITIN) 10 MG tablet Take 10 mg by mouth daily.      . magnesium gluconate (MAGONATE) 500 MG tablet Take 500 mg by mouth 2 (two) times daily.    . methotrexate 50 MG/2ML injection ADMINISTER 0.7 ML(17.5 MG) UNDER THE SKIN 1 TIME A WEEK 10 mL 0  . modafinil (PROVIGIL) 200 MG tablet Take 1 tablet (200 mg total) by mouth daily. 90 tablet 3  . montelukast (SINGULAIR) 10 MG tablet TAKE 1 TABLET BY MOUTH EVERY NIGHT AT BEDTIME 30 tablet 11  . mupirocin ointment (BACTROBAN) 2 % Place 1 application into the nose as needed.     . nabumetone (RELAFEN) 750 MG tablet Take 750 mg by mouth 2 (two) times daily as needed.     . NON FORMULARY Doterra Essential Oils:  Eucalyptus, Wintergreen, Lemongrass, [place], Hypericum, [employer]    . ondansetron (ZOFRAN) 4 MG tablet TAKE 1 TABLET BY MOUTH  EVERY 6 HOURS AS NEEDED FOR NAUSEA 30 tablet 0  . phenylephrine (SUDAFED PE) 10 MG TABS tablet Take 10 mg by mouth every 4 (four) hours as needed.    Marland Kitchen Specialty Vitamins Products (CENTRUM PERFORMANCE) TABS Take 1 tablet by mouth daily.      . SYMBICORT 160-4.5 MCG/ACT inhaler INHALE 2 PUFFS INTO THE LUNGS TWICE DAILY. RINSE MOUTH AFTER USE 10.2 g 5  . Vitamin D, Ergocalciferol, (DRISDOL) 50000 units CAPS capsule TAKE 1 CAPSULE BY MOUTH ONCE A MONTH     No facility-administered medications prior to visit.     PAST MEDICAL HISTORY: Past Medical History:  Diagnosis Date  . Allergic rhinitis    skin test 01-18-10  . Bronchitis   . Lupus (systemic lupus erythematosus) (HCC)    joints and skin rash. Dr. Estanislado Pandy  . Melanoma ([place])   . Rhinosinusitis   . Skin cancer     PAST SURGICAL HISTORY: Past Surgical History:  Procedure Laterality Date  . ABDOMINAL HYSTERECTOMY    . KNEE ARTHROPLASTY    . skin cancer extraction      FAMILY HISTORY: Family History  Problem Relation Age of Onset  . Allergies Father   . Heart disease Father        bacterial endocarditis  . Cancer Maternal Grandmother     SOCIAL HISTORY: Social History   Social History  . Marital status: Married    Spouse name: N/A  . Number of children: 3  . Years of education: N/A   Occupational History  . 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 Smoking status: Never Smoker  . Smokeless tobacco: Never Used  . Alcohol use Yes     Comment: 1 MONTHLY  . Drug use: No  . Sexual activity: Not on file   Other Topics Concern  . Not on file   Social History Narrative  . No narrative on file      PHYSICAL EXAM  Vitals:   07/27/17 0903  BP: 140/85  Pulse: 72  Weight: 192 lb (87.1 kg)  Height: 5' 3.5" (1.613 m)   Body mass index is 33.48 kg/m.  Generalized: Well  developed, in no acute distress   Neurological examination  Mentation: Alert oriented to time, place, history  taking. Follows all commands speech and language fluent Cranial nerve II-XII: Pupils were equal round reactive to light. Extraocular movements were full, visual field were full on confrontational test. Facial sensation and strength were normal. Uvula tongue midline. Head turning and shoulder shrug  were normal and symmetric. Motor: The motor testing reveals 5 over 5 strength of all 4 extremities. Good symmetric motor tone is noted throughout.  Sensory: Sensory testing is intact to soft touch on all 4 extremities. No evidence of extinction is noted.  Coordination: Cerebellar testing reveals good finger-nose-finger and heel-to-shin bilaterally.  Gait and station: Gait is normal. Tandem gait is normal. Romberg is negative. No drift is seen.  Reflexes: Deep tendon reflexes are symmetric and normal bilaterally.   DIAGNOSTIC DATA (LABS, IMAGING, TESTING) - I reviewed patient records, labs, notes, testing and imaging myself where available.  Lab Results  Component Value Date   WBC 7.4 06/02/2017   HGB 14.3 06/02/2017   HCT 42.7 06/02/2017   MCV 91.2 06/02/2017   PLT 298 06/02/2017      Component Value Date/Time   NA 142 06/02/2017 0945   K 3.9 06/02/2017 0945   CL 102 06/02/2017 0945   CO2 26 06/02/2017 0945   GLUCOSE 109 (H) 06/02/2017 0945   BUN 19 06/02/2017 0945   CREATININE 1.08 (H) 06/02/2017 0945   CALCIUM 9.5 06/02/2017 0945   PROT 6.3 06/02/2017 0945   ALBUMIN 4.1 06/02/2017 0945   AST 20 06/02/2017 0945   ALT 12 06/02/2017 0945   ALKPHOS 82 06/02/2017 0945   BILITOT 0.4 06/02/2017 0945   GFRNONAA 57 (L) 06/02/2017 0945   GFRAA 66 06/02/2017 0945      ASSESSMENT AND PLAN 56 y.o. year old female  has a past medical history of Allergic rhinitis; Bronchitis; Lupus (systemic lupus erythematosus) ([place]); Melanoma ([place]); Rhinosinusitis; and Skin cancer. here with:  1. Idiopathic hypersomnia  Overall the patient is doing well. She will continue on Provigil. I am amenable to  her taking a half a tablet in the morning and half a tablet at lunch. If her symptoms worsen or she develops new symptoms she should let us know. She will follow-up in 6 months or sooner if needed.  I spent 15 minutes with the patient. 50% of this time was spent discussing medication Provigil.      Ward Givens, MSN, NP-C 07/27/2017, 8:41 AM [facility] [address], [place], [place] [address] [phone]

## 2017-07-29 NOTE — Progress Notes (Signed)
I agree with the assessment and plan as directed by NP .The patient is known to me and can follow yearly.   Keondre Markson, MD

## 2017-08-11 ENCOUNTER — Encounter: Payer: Self-pay | Admitting: Rheumatology

## 2017-08-18 ENCOUNTER — Telehealth: Payer: Self-pay | Admitting: Rheumatology

## 2017-08-18 NOTE — Telephone Encounter (Signed)
Patient left a message on answering machine that is was returning Andrea's call. 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

## 2017-08-18 NOTE — Telephone Encounter (Signed)
Left message for patient to ask patient if the sores in her mouth are painful. Advised that it would make a difference as to which prescription of magic mouthwash we will call to the pharmacy.

## 2017-08-18 NOTE — Telephone Encounter (Signed)
Patient states the sores are not painful and going away Patient states she no longer needs the prescription.

## 2017-08-23 ENCOUNTER — Other Ambulatory Visit: Payer: Self-pay | Admitting: Rheumatology

## 2017-08-23 NOTE — Telephone Encounter (Signed)
Last Visit: 06/02/17 Next Visit: 11/02/17 Labs: 03/24/17 WNL PLQ Eye Exam: 07/12/16 WNL

## 2017-08-23 NOTE — Telephone Encounter (Signed)
Attempted to contact the patient and left message for patient to call the office. Need update PLQ eye exam. Patient is also due for labs.

## 2017-08-25 ENCOUNTER — Telehealth: Payer: Self-pay | Admitting: Rheumatology

## 2017-08-25 NOTE — Telephone Encounter (Signed)
ok 

## 2017-08-25 NOTE — Telephone Encounter (Signed)
Attempted to contact the patient and left message for patient to call the office.   Last Visit: 06/02/17 Next Visit: 11/02/17 Labs: 03/24/17 WNL PLQ Eye Exam: 07/12/16 WNL  Okay to refill 30 day supply PLQ?

## 2017-08-25 NOTE — Telephone Encounter (Signed)
Patient returning Andrea's call. Per patient best time to call her is around 2:20pm.

## 2017-08-28 NOTE — Telephone Encounter (Signed)
Patient states she has had it performed. Patient will call eye doctor and have it sent over to the office.

## 2017-08-28 NOTE — Telephone Encounter (Signed)
Attempted to contact patient and left message for patient to call the office.   Patient needs updated PLQ eye exam.

## 2017-09-03 ENCOUNTER — Other Ambulatory Visit: Payer: Self-pay | Admitting: Neurology

## 2017-09-15 ENCOUNTER — Other Ambulatory Visit: Payer: Self-pay | Admitting: Rheumatology

## 2017-09-15 NOTE — Telephone Encounter (Signed)
Last Visit: 06/02/17 Next Visit: 11/02/17  Okay to refill per Dr. Estanislado Pandy

## 2017-09-25 ENCOUNTER — Other Ambulatory Visit: Payer: Self-pay | Admitting: Rheumatology

## 2017-09-25 NOTE — Telephone Encounter (Signed)
Last Visit: 06/02/17 Next Visit: 11/02/17 PLQ Eye Exam: 07/12/16 WNL Labs: 06/02/17 Mild increase in creatinine. We will continue to monitor her labs.  Okay to refill per Dr. Estanislado Pandy

## 2017-09-29 ENCOUNTER — Encounter: Payer: Self-pay | Admitting: *Deleted

## 2017-09-29 ENCOUNTER — Other Ambulatory Visit: Payer: Self-pay

## 2017-09-29 ENCOUNTER — Other Ambulatory Visit: Payer: Self-pay | Admitting: Rheumatology

## 2017-09-29 DIAGNOSIS — Z79899 Other long term (current) drug therapy: Secondary | ICD-10-CM

## 2017-09-29 LAB — COMPLETE METABOLIC PANEL WITH GFR
AG Ratio: 1.7 (calc) (ref 1.0–2.5)
ALBUMIN MSPROF: 4.2 g/dL (ref 3.6–5.1)
ALT: 12 U/L (ref 6–29)
AST: 20 U/L (ref 10–35)
Alkaline phosphatase (APISO): 85 U/L (ref 33–130)
BUN: 15 mg/dL (ref 7–25)
CALCIUM: 9.5 mg/dL (ref 8.6–10.4)
CO2: 32 mmol/L (ref 20–32)
CREATININE: 1.01 mg/dL (ref 0.50–1.05)
Chloride: 103 mmol/L (ref 98–110)
GFR, EST AFRICAN AMERICAN: 72 mL/min/{1.73_m2} (ref >=60)
GFR, EST NON AFRICAN AMERICAN: 62 mL/min/{1.73_m2} (ref >=60)
GLOBULIN: 2.5 g/dL (ref 1.9–3.7)
Glucose, Bld: 97 mg/dL (ref 65–99)
Potassium: 3.8 mmol/L (ref 3.5–5.3)
SODIUM: 143 mmol/L (ref 135–146)
TOTAL PROTEIN: 6.7 g/dL (ref 6.1–8.1)
Total Bilirubin: 0.4 mg/dL (ref 0.2–1.2)

## 2017-09-29 LAB — CBC WITH DIFFERENTIAL/PLATELET
BASOS ABS: 47 {cells}/uL (ref 0–200)
Basophils Relative: 0.5 %
EOS PCT: 1.1 %
Eosinophils Absolute: 102 cells/uL (ref 15–500)
HEMATOCRIT: 41.2 % (ref 35.0–45.0)
HEMOGLOBIN: 14.1 g/dL (ref 11.7–15.5)
LYMPHS ABS: 3069 {cells}/uL (ref 850–3900)
MCH: 30.1 pg (ref 27.0–33.0)
MCHC: 34.2 g/dL (ref 32.0–36.0)
MCV: 88 fL (ref 80.0–100.0)
MPV: 9.6 fL (ref 7.5–12.5)
Monocytes Relative: 7 %
NEUTROS ABS: 5431 {cells}/uL (ref 1500–7800)
Neutrophils Relative %: 58.4 %
Platelets: 322 10*3/uL (ref 140–400)
RBC: 4.68 10*6/uL (ref 3.80–5.10)
RDW: 13.1 % (ref 11.0–15.0)
Total Lymphocyte: 33 %
WBC: 9.3 10*3/uL (ref 3.8–10.8)
WBCMIX: 651 {cells}/uL (ref 200–950)

## 2017-09-29 NOTE — Telephone Encounter (Addendum)
Last Visit: 06/02/17 Next Visit: 11/02/17 Labs: 06/02/17 Mild increase in creatinine. We will continue to monitor her labs.Left message to advise patient due to update labs.   Ok to refill 30 day supply, per Dr. Estanislado Pandy.

## 2017-09-30 NOTE — Progress Notes (Signed)
WNL

## 2017-10-24 NOTE — Progress Notes (Signed)
Office Visit Note  Patient: Misty Curtis             Date of Birth: February 15, 1961           MRN: [id number]             PCP: Helen Hashimoto., MD Referring: Helen Hashimoto., MD Visit Date: 11/02/2017 Occupation: @GUAROCC @    Subjective:  Medication management   History of Present Illness: Misty Curtis is a 55 y.o. female with history of autoimmune disease. She states she's been doing quite well on current combination of medication. She denies any joint pain or joint swelling. She's been having some Raynauds now as the weather is turned cold. She continues to have some sicca symptoms. Her carpal tunnel syndrome symptoms are better with the use of brace.  Activities of Daily Living:  Patient reports morning stiffness for 5 minutes.   Patient Denies nocturnal pain.  Difficulty dressing/grooming: Denies Difficulty climbing stairs: Denies Difficulty getting out of chair: Denies Difficulty using hands for taps, buttons, cutlery, and/or writing: Denies   Review of Systems  Constitutional: Positive for fatigue. Negative for night sweats, weight gain, weight loss and weakness.  HENT: Positive for mouth dryness. Negative for mouth sores, trouble swallowing, trouble swallowing and nose dryness.   Eyes: Positive for dryness. Negative for pain, redness and visual disturbance.  Respiratory: Negative for cough, shortness of breath and difficulty breathing.   Cardiovascular: Negative for chest pain, palpitations, hypertension, irregular heartbeat and swelling in legs/feet.  Gastrointestinal: Negative for blood in stool, constipation and diarrhea.  Endocrine: Negative for increased urination.  Genitourinary: Negative for vaginal dryness.  Musculoskeletal: Positive for morning stiffness. Negative for arthralgias, joint pain, joint swelling, myalgias, muscle weakness, muscle tenderness and myalgias.  Skin: Positive for color change. Negative for rash, hair loss, skin tightness, ulcers and  sensitivity to sunlight.  Allergic/Immunologic: Negative for susceptible to infections.  Neurological: Negative for dizziness, memory loss and night sweats.  Hematological: Negative for swollen glands.  Psychiatric/Behavioral: Negative for depressed mood and sleep disturbance. The patient is not nervous/anxious.     PMFS History:  Patient Active Problem List   Diagnosis Date Noted  . Acquired hypothyroidism 12/08/2016  . Autoimmune disease ([place]) 12/06/2016  . Other fatigue 12/06/2016  . Raynaud's disease without gangrene 12/06/2016  . High risk medication use 12/06/2016  . Allergic asthma, mild intermittent, uncomplicated 50/93/2671  . History of hypertension 12/06/2016  . OSA (obstructive sleep apnea) 11/08/2016  . Excessive daytime sleepiness 07/26/2016  . Hypersomnia with sleep apnea 07/26/2016  . Snoring 07/26/2016  . Laryngitis 11/10/2012  . Postnasal drip 05/02/2012  . Acute sinusitis, unspecified 10/24/2011  . HYPERTENSION 12/14/2009  . BRONCHITIS 12/14/2009  . LUPUS 12/14/2009  . History of melanoma 12/14/2009  . Seasonal and perennial allergic rhinitis 12/14/2009    Past Medical History:  Diagnosis Date  . Allergic rhinitis    skin test 01-18-10  . Bronchitis   . Lupus (systemic lupus erythematosus) (HCC)    joints and skin rash. Dr. Estanislado Pandy  . Melanoma ([place])   . Rhinosinusitis   . Skin cancer     Family History  Problem Relation Age of Onset  . Allergies Father   . Heart disease Father        bacterial endocarditis  . Cancer Maternal Grandmother   . Hypothyroidism Sister   . Hypothyroidism Sister   . Hashimoto's thyroiditis Sister   . Hepatitis Son    Past Surgical History:  Procedure Laterality Date  .  ABDOMINAL HYSTERECTOMY    . KNEE ARTHROPLASTY    . skin cancer extraction     Social History   Social History Narrative  . Not on file     Objective: Vital Signs: BP (!) 144/74 (BP Location: Left Arm, Patient Position: Sitting, Cuff Size:  Normal)   Pulse 74   Resp 14   Ht 5\' 3"  (1.6 m)   Wt 195 lb (88.5 kg)   BMI 34.54 kg/m    Physical Exam  Constitutional: She is oriented to person, place, and time. She appears well-developed and well-nourished.  HENT:  Head: Normocephalic and atraumatic.  Eyes: Conjunctivae and EOM are normal.  Neck: Normal range of motion.  Cardiovascular: Normal rate, regular rhythm, normal heart sounds and intact distal pulses.  Pulmonary/Chest: Effort normal and breath sounds normal.  Abdominal: Soft. Bowel sounds are normal.  Lymphadenopathy:    She has no cervical adenopathy.  Neurological: She is alert and oriented to person, place, and time.  Skin: Skin is warm and dry. Capillary refill takes less than 2 seconds.  Psychiatric: She has a normal mood and affect. Her behavior is normal.  Nursing note and vitals reviewed.    Musculoskeletal Exam: C-spine and thoracic lumbar spine good range of motion. Shoulder joints elbow joints are good range of motion. She had no synovitis over MCPs PIPs DIP joints. She has mild osteoarthritic changes in her hands. Hip joints knee joints ankle joints are good range of motion. She had right Achilles tendinitis which is better which she had right posterior calcaneal spur.  CDAI Exam: CDAI Homunculus Exam:   Joint Counts:  CDAI Tender Joint count: 0 CDAI Swollen Joint count: 0  Global Assessments:  Patient Global Assessment: 4 Provider Global Assessment: 2  CDAI Calculated Score: 6    Investigation: No additional findings.PLQ eye exam: 06/09/2017  CBC Latest Ref Rng & Units 09/29/2017 06/02/2017 03/24/2017  WBC 3.8 - 10.8 Thousand/uL 9.3 7.4 8.4  Hemoglobin 11.7 - 15.5 g/dL 14.1 14.3 14.8  Hematocrit 35.0 - 45.0 % 41.2 42.7 44.6  Platelets 140 - 400 Thousand/uL 322 298 319   CMP Latest Ref Rng & Units 09/29/2017 06/02/2017 03/24/2017  Glucose 65 - 99 mg/dL 97 109(H) 107(H)  BUN 7 - 25 mg/dL 15 19 17   Creatinine 0.50 - 1.05 mg/dL 1.01 1.08(H) 0.90    Sodium 135 - 146 mmol/L 143 142 140  Potassium 3.5 - 5.3 mmol/L 3.8 3.9 3.9  Chloride 98 - 110 mmol/L 103 102 102  CO2 20 - 32 mmol/L 32 26 30  Calcium 8.6 - 10.4 mg/dL 9.5 9.5 9.3  Total Protein 6.1 - 8.1 g/dL 6.7 6.3 6.5  Total Bilirubin 0.2 - 1.2 mg/dL 0.4 0.4 0.4  Alkaline Phos 33 - 130 U/L - 82 73  AST 10 - 35 U/L 20 20 22   ALT 6 - 29 U/L 12 12 13     Imaging: No results found.  Speciality Comments: PLQ eye exam:  06/09/2017    Procedures:  No procedures performed Allergies: Latex and Neomycin-bacitracin zn-polymyx   Assessment / Plan:     Visit Diagnoses: Autoimmune disease ([place]) - History of positive ANA, DS DNA, Raynauds, fatigue, oral ulcers, nasal ulcers, arthralgias,sicca. Clinically she is doing better now. She has mild Raynauds with the weather change and also having some sicca symptoms.  High risk medication use - Plaquenil 200 mg a.m. and 100 mg p.m. , methotrexate 0.5 mL subcutaneous every week, folic acid 2 mg by mouth daily. Her  labs have been stable. We will continue to monitor her labs every 3 months.patient wants to switch to oral methotrexate. We will discontinue subcutaneous methotrexate and we'll give prescription for methotrexate 5 tablets by mouth every week total 90 days supply of with no refills.  Raynaud's disease without gangrene: Warm clothing was discussed.  Right carpal tunnel syndrome - Patient has been using carpal tunnel brace.her symptoms have improved with using a brace.   Achilles tendinitis of right lower extremity: Is gradually improving.  History of melanoma - Followed up by dermatology  Other medical problems are listed as follows:  History of fatigue  History of asthma  History of hypertension  History of sleep apnea  History of hypothyroidism    Orders: No orders of the defined types were placed in this encounter.  Meds ordered this encounter  Medications  . methotrexate (RHEUMATREX) 2.5 MG tablet    Sig: Take 5  tablets q week. Caution:Chemotherapy. Protect from light.    Dispense:  60 tablet    Refill:  0    Follow-Up Instructions: Return in about 5 months (around 04/02/2018) for Autoimmune disease.   Bo Merino, MD  Note - This record has been created using Editor, commissioning.  Chart creation errors have been sought, but may not always  have been located. Such creation errors do not reflect on  the standard of medical care.

## 2017-11-02 ENCOUNTER — Encounter: Payer: Self-pay | Admitting: Rheumatology

## 2017-11-02 ENCOUNTER — Ambulatory Visit: Payer: BC Managed Care – PPO | Admitting: Rheumatology

## 2017-11-02 VITALS — BP 144/74 | HR 74 | Resp 14 | Ht 63.0 in | Wt 195.0 lb

## 2017-11-02 DIAGNOSIS — Z8639 Personal history of other endocrine, nutritional and metabolic disease: Secondary | ICD-10-CM

## 2017-11-02 DIAGNOSIS — Z85820 Personal history of malignant melanoma of skin: Secondary | ICD-10-CM | POA: Diagnosis not present

## 2017-11-02 DIAGNOSIS — Z8679 Personal history of other diseases of the circulatory system: Secondary | ICD-10-CM

## 2017-11-02 DIAGNOSIS — M7661 Achilles tendinitis, right leg: Secondary | ICD-10-CM

## 2017-11-02 DIAGNOSIS — M359 Systemic involvement of connective tissue, unspecified: Secondary | ICD-10-CM

## 2017-11-02 DIAGNOSIS — I7300 Raynaud's syndrome without gangrene: Secondary | ICD-10-CM

## 2017-11-02 DIAGNOSIS — Z8669 Personal history of other diseases of the nervous system and sense organs: Secondary | ICD-10-CM

## 2017-11-02 DIAGNOSIS — G5601 Carpal tunnel syndrome, right upper limb: Secondary | ICD-10-CM | POA: Diagnosis not present

## 2017-11-02 DIAGNOSIS — Z79899 Other long term (current) drug therapy: Secondary | ICD-10-CM

## 2017-11-02 DIAGNOSIS — Z87898 Personal history of other specified conditions: Secondary | ICD-10-CM

## 2017-11-02 DIAGNOSIS — Z8709 Personal history of other diseases of the respiratory system: Secondary | ICD-10-CM

## 2017-11-02 DIAGNOSIS — D8989 Other specified disorders involving the immune mechanism, not elsewhere classified: Secondary | ICD-10-CM | POA: Diagnosis not present

## 2017-11-02 MED ORDER — METHOTREXATE 2.5 MG PO TABS: ORAL_TABLET | ORAL | 0 refills | Status: DC

## 2017-11-02 NOTE — Patient Instructions (Signed)
Standing Labs We placed an order today for your standing lab work.    Please come back and get your standing labs in January and every 3 months  We have open lab Monday through Friday from 8:30-11:30 AM and 1:30-4 PM at the office of Dr. Shelena Castelluccio.   The office is located at [address], [address], [place], [place] [address] No appointment is necessary.   Labs are drawn by [facility].  You may receive a bill from [facility] for your lab work. If you have any questions regarding directions or hours of operation,  please call [phone].    

## 2017-11-22 ENCOUNTER — Other Ambulatory Visit: Payer: Self-pay | Admitting: Rheumatology

## 2017-11-22 NOTE — Telephone Encounter (Signed)
Last Visit: 11/02/17 Next Visit: 04/11/18  Okay to refill per Dr. Estanislado Pandy

## 2017-12-15 ENCOUNTER — Other Ambulatory Visit: Payer: Self-pay | Admitting: Internal Medicine

## 2017-12-16 ENCOUNTER — Other Ambulatory Visit: Payer: Self-pay | Admitting: Internal Medicine

## 2017-12-20 ENCOUNTER — Other Ambulatory Visit: Payer: Self-pay | Admitting: Rheumatology

## 2017-12-20 NOTE — Telephone Encounter (Signed)
Last Visit: 11/02/17 Next Visit: 04/11/18 Labs: 09/29/17 WNL PLQ Eye Exam: 06/09/17 WNL  Okay to refill per Dr. Estanislado Pandy

## 2018-01-01 ENCOUNTER — Ambulatory Visit: Payer: Self-pay | Admitting: Internal Medicine

## 2018-01-16 ENCOUNTER — Other Ambulatory Visit: Payer: Self-pay | Admitting: Internal Medicine

## 2018-01-29 ENCOUNTER — Encounter: Payer: Self-pay | Admitting: Adult Health

## 2018-01-29 ENCOUNTER — Ambulatory Visit: Payer: BC Managed Care – PPO | Admitting: Neurology

## 2018-01-29 ENCOUNTER — Other Ambulatory Visit: Payer: Self-pay

## 2018-01-29 ENCOUNTER — Ambulatory Visit: Payer: BC Managed Care – PPO | Admitting: Adult Health

## 2018-01-29 VITALS — BP 161/86 | HR 83 | Ht 63.0 in | Wt 198.2 lb

## 2018-01-29 DIAGNOSIS — G4711 Idiopathic hypersomnia with long sleep time: Secondary | ICD-10-CM | POA: Diagnosis not present

## 2018-01-29 DIAGNOSIS — Z79899 Other long term (current) drug therapy: Secondary | ICD-10-CM

## 2018-01-29 LAB — CBC WITH DIFFERENTIAL/PLATELET
BASOS ABS: 46 {cells}/uL (ref 0–200)
Basophils Relative: 0.5 %
EOS PCT: 2.3 %
Eosinophils Absolute: 212 cells/uL (ref 15–500)
HCT: 42.1 % (ref 35.0–45.0)
HEMOGLOBIN: 14.7 g/dL (ref 11.7–15.5)
Lymphs Abs: 2889 cells/uL (ref 850–3900)
MCH: 30.2 pg (ref 27.0–33.0)
MCHC: 34.9 g/dL (ref 32.0–36.0)
MCV: 86.6 fL (ref 80.0–100.0)
MPV: 9.6 fL (ref 7.5–12.5)
Monocytes Relative: 6.1 %
NEUTROS ABS: 5492 {cells}/uL (ref 1500–7800)
Neutrophils Relative %: 59.7 %
Platelets: 320 10*3/uL (ref 140–400)
RBC: 4.86 10*6/uL (ref 3.80–5.10)
RDW: 13.2 % (ref 11.0–15.0)
Total Lymphocyte: 31.4 %
WBC: 9.2 10*3/uL (ref 3.8–10.8)
WBCMIX: 561 {cells}/uL (ref 200–950)

## 2018-01-29 LAB — COMPLETE METABOLIC PANEL WITH GFR
AG RATIO: 1.6 (calc) (ref 1.0–2.5)
ALKALINE PHOSPHATASE (APISO): 90 U/L (ref 33–130)
ALT: 16 U/L (ref 6–29)
AST: 25 U/L (ref 10–35)
Albumin: 4.1 g/dL (ref 3.6–5.1)
BUN: 19 mg/dL (ref 7–25)
CALCIUM: 9.5 mg/dL (ref 8.6–10.4)
CHLORIDE: 104 mmol/L (ref 98–110)
CO2: 30 mmol/L (ref 20–32)
CREATININE: 0.91 mg/dL (ref 0.50–1.05)
GFR, EST AFRICAN AMERICAN: 81 mL/min/{1.73_m2} (ref >=60)
GFR, Est Non African American: 70 mL/min/{1.73_m2} (ref >=60)
GLOBULIN: 2.6 g/dL (ref 1.9–3.7)
Glucose, Bld: 126 mg/dL — ABNORMAL HIGH (ref 65–99)
Potassium: 3.6 mmol/L (ref 3.5–5.3)
Sodium: 143 mmol/L (ref 135–146)
Total Bilirubin: 0.3 mg/dL (ref 0.2–1.2)
Total Protein: 6.7 g/dL (ref 6.1–8.1)

## 2018-01-29 MED ORDER — MODAFINIL 200 MG PO TABS: 200.0000 mg | ORAL_TABLET | Freq: Every day | ORAL | 1 refills | Status: DC

## 2018-01-29 NOTE — Progress Notes (Addendum)
PATIENT: Misty Curtis DOB: 1961-01-15  REASON FOR VISIT: follow up HISTORY FROM: patient  HISTORY OF PRESENT ILLNESS:  Today 01/29/18 Misty Curtis is a 57 year old female with a history of idiopathic hypersomnia.  She returns today for follow-up.  She is currently taking Provigil 200 mg daily.  She reports that she is tolerating this well.  She states that she can only take half a tablet in the morning and again at lunchtime.  She denies falling asleep on the job.  She states occasionally she will have flareups where she is more sleepy than other times.  She also reports that her stress level has increased.  She states that she changed jobs and finished her doctorate and therefore her strength has improved which she thinks has improved her sleepiness as well.  She returns today for evaluation.    HISTORY [name] is a 57 year old female with a history of idiopathic hypersomnia. She returns today for follow-up visit she is currently taking Provigil. In the past she did try CPAP therapy but offered no benefit for her daytime sleepiness. She also had HLA narcolepsy testing which was negative. She reports that Provigil does offer her good benefit however she did receive better benefit from nuvigil. However this is not covered for her. She denies falling asleep throughout the day. Denies any difficulty driving. She states typically by 7 PM she is getting sleepy. She typically goes to bed around 10 PM and wakes up at 5:30 AM. She does not consume a lot of caffeine. She states right now she is taking 100 mg of Provigil daily. She is getting ready to start teaching again and she is considering taking half a tablet in the morning and then a half a tablet at lunchtime. She returns today for an evaluation.  REVIEW OF SYSTEMS: Out of a complete 14 system review of symptoms, the patient complains only of the following symptoms, and all other reviewed systems are negative.  See HPI  ALLERGIES: Allergies    Allergen Reactions  . Latex Other (See Comments)  . Neomycin-Bacitracin Zn-Polymyx     REACTION: rash    HOME MEDICATIONS: Outpatient Medications Prior to Visit  Medication Sig Dispense Refill  . Ascorbic Acid (VITAMIN C) 500 MG tablet Take 500 mg by mouth daily.      . B Complex Vitamins (VITAMIN B COMPLEX PO) Take by mouth.    . B-D TB SYRINGE 1CC/27GX1/2" 27G X 1/2" 1 ML MISC USE 1 EVERY WEEK AS DIRECTED 12 each 0  . BIOTENE DRY MOUTH (BIOTENE) LIQD Place 1 application onto teeth as needed.      . Cholecalciferol (VITAMIN D) 2000 units CAPS Take by mouth.    . clotrimazole-betamethasone (LOTRISONE) cream Apply topically 2 (two) times daily. 30 g prn  . Digestive Enzymes (DIGESTIVE ENZYME PO) Take by mouth.    . fluorouracil (EFUDEX) 5 % cream APP AA D FOR 14 DAYS  0  . fluticasone (FLONASE) 50 MCG/ACT nasal spray SHAKE LIQUID AND USE 1 TO 2 SPRAYS IN EACH NOSTRIL 1 TO 2 TIMES DAILY 16 g 0  . folic acid (FOLVITE) 1 MG tablet TAKE 2 TABLETS BY MOUTH ONCE A DAY 180 tablet 4  . hydroxychloroquine (PLAQUENIL) 200 MG tablet TAKE 1 TABLET(200 MG) BY MOUTH TWICE DAILY 60 tablet 2  . lactase (LACTAID) 3000 UNITS tablet Take by mouth as needed.     Marland Kitchen levothyroxine (SYNTHROID) 50 MCG tablet TK 1 T PO QAM FOR THYROID    .  lisinopril (PRINIVIL,ZESTRIL) 5 MG tablet Take 5 mg by mouth daily.    Marland Kitchen loratadine (CLARITIN) 10 MG tablet Take 10 mg by mouth daily.      . magnesium gluconate (MAGONATE) 500 MG tablet Take 500 mg by mouth 2 (two) times daily.    . methotrexate (RHEUMATREX) 2.5 MG tablet Take 5 tablets q week. Caution:Chemotherapy. Protect from light. 60 tablet 0  . modafinil (PROVIGIL) 200 MG tablet TAKE 1 TABLET BY MOUTH EVERY DAY 90 tablet 0  . montelukast (SINGULAIR) 10 MG tablet TAKE 1 TABLET BY MOUTH EVERY NIGHT AT BEDTIME 30 tablet 0  . mupirocin ointment (BACTROBAN) 2 % Place 1 application into the nose as needed.     . nabumetone (RELAFEN) 750 MG tablet Take 750 mg by mouth 2 (two)  times daily as needed.     . NON FORMULARY Doterra Essential Oils:  Eucalyptus, [place], Lemongrass, [place], [place], [employer]    . ondansetron (ZOFRAN) 4 MG tablet TAKE 1 TABLET BY MOUTH EVERY 6 HOURS AS NEEDED FOR NAUSEA 30 tablet 0  . phenylephrine (SUDAFED PE) 10 MG TABS tablet Take 10 mg by mouth every 4 (four) hours as needed.    Marland Kitchen Specialty Vitamins Products (CENTRUM PERFORMANCE) TABS Take 1 tablet by mouth daily.      . SYMBICORT 160-4.5 MCG/ACT inhaler INHALE 2 PUFFS INTO THE LUNGS TWICE DAILY. RINSE MOUTH AFTER USE 10.2 g 5   No facility-administered medications prior to visit.     PAST MEDICAL HISTORY: Past Medical History:  Diagnosis Date  . Allergic rhinitis    skin test 01-18-10  . Bronchitis   . Lupus (systemic lupus erythematosus) (HCC)    joints and skin rash. Dr. Estanislado Pandy  . Melanoma ([place])   . Rhinosinusitis   . Skin cancer     PAST SURGICAL HISTORY: Past Surgical History:  Procedure Laterality Date  . ABDOMINAL HYSTERECTOMY    . KNEE ARTHROPLASTY    . skin cancer extraction      FAMILY HISTORY: Family History  Problem Relation Age of Onset  . Allergies Father   . Heart disease Father        bacterial endocarditis  . Cancer Maternal Grandmother   . Hypothyroidism Sister   . Hypothyroidism Sister   . Hashimoto's thyroiditis Sister   . Hepatitis Son     SOCIAL HISTORY: Social History   Socioeconomic History  . Marital status: Married    Spouse name: Not on file  . Number of children: 3  . Years of education: Not on file  . Highest education level: Not on file  Social Needs  . Financial resource strain: Not on file  . Food insecurity - worry: Not on file  . Food insecurity - inability: Not on file  . Transportation needs - medical: Not on file  . Transportation needs - non-medical: Not on file  Occupational History  . 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 Smoking status: Never Smoker  . Smokeless  tobacco: Never Used  Substance and Sexual Activity  . Alcohol use: Yes    Comment: 1 MONTHLY  . Drug use: No  . Sexual activity: Not on file  Other Topics Concern  . Not on file  Social History Narrative  . Not on file      PHYSICAL EXAM  Vitals:   01/29/18 1301  BP: (!) 161/86  Pulse: 83  Weight: 198 lb 3.2 oz (89.9 kg)  Height: 5' 3" (1.6  m)   Body mass index is 35.11 kg/m.  Generalized: Well developed, in no acute distress   Neurological examination  Mentation: Alert oriented to time, place, history taking. Follows all commands speech and language fluent Cranial nerve II-XII: Pupils were equal round reactive to light. Extraocular movements were full, visual field were full on confrontational test. Facial sensation and strength were normal. Uvula tongue midline. Head turning and shoulder shrug  were normal and symmetric. Motor: The motor testing reveals 5 over 5 strength of all 4 extremities. Good symmetric motor tone is noted throughout.  Sensory: Sensory testing is intact to soft touch on all 4 extremities. No evidence of extinction is noted.  Coordination: Cerebellar testing reveals good finger-nose-finger and heel-to-shin bilaterally.  Gait and station: Gait is normal.  Reflexes: Deep tendon reflexes are symmetric and normal bilaterally.   DIAGNOSTIC DATA (LABS, IMAGING, TESTING) - I reviewed patient records, labs, notes, testing and imaging myself where available.  Lab Results  Component Value Date   WBC 9.3 09/29/2017   HGB 14.1 09/29/2017   HCT 41.2 09/29/2017   MCV 88.0 09/29/2017   PLT 322 09/29/2017      Component Value Date/Time   NA 143 09/29/2017 1457   K 3.8 09/29/2017 1457   CL 103 09/29/2017 1457   CO2 32 09/29/2017 1457   GLUCOSE 97 09/29/2017 1457   BUN 15 09/29/2017 1457   CREATININE 1.01 09/29/2017 1457   CALCIUM 9.5 09/29/2017 1457   PROT 6.7 09/29/2017 1457   ALBUMIN 4.1 06/02/2017 0945   AST 20 09/29/2017 1457   ALT 12 09/29/2017  1457   ALKPHOS 82 06/02/2017 0945   BILITOT 0.4 09/29/2017 1457   GFRNONAA 62 09/29/2017 1457   GFRAA 72 09/29/2017 1457      ASSESSMENT AND PLAN 57 y.o. year old female  has a past medical history of Allergic rhinitis, Bronchitis, Lupus (systemic lupus erythematosus) ([place]), Melanoma ([place]), Rhinosinusitis, and Skin cancer. here with:  1.  Idiopathic hypersomnia  Overall the patient is doing well.  She will continue on Provigil 200 mg daily.  She is advised that if her symptoms worsen or she develops new symptoms she should let us know.  She will follow-up in 6 months or sooner if needed.  I spent 15 minutes with the patient. 50% of this time was spent reviewing medication.      Ward Givens, MSN, NP-C 01/29/2018, 12:56 PM [facility] [address], [place], [place] [address] [phone]  I reviewed the above note and documentation by the Nurse Practitioner and agree with the history, physical exam, assessment and plan as outlined above. I was immediately available for face-to-face consultation. Star Age, MD, PhD [facility])

## 2018-01-29 NOTE — Patient Instructions (Signed)
Your Plan:  Continue Provigil If your symptoms worsen or you develop new symptoms please let us know.   Thank you for coming to see [place] at [facility]. I hope we have been able to provide you high quality care today.  You may receive a patient satisfaction survey over the next few weeks. We would appreciate your feedback and comments so that we may continue to improve ourselves and the health of our patients.

## 2018-01-30 ENCOUNTER — Other Ambulatory Visit: Payer: Self-pay | Admitting: Internal Medicine

## 2018-02-14 ENCOUNTER — Other Ambulatory Visit: Payer: Self-pay | Admitting: Internal Medicine

## 2018-03-04 ENCOUNTER — Other Ambulatory Visit: Payer: Self-pay | Admitting: Rheumatology

## 2018-03-05 NOTE — Telephone Encounter (Signed)
Last Visit: 11/02/17 Next Visit: 04/11/18 Labs: 01/29/18 Glucose is elevated. All other labs are WNL.  Okay to refill per Dr. Estanislado Pandy

## 2018-03-09 ENCOUNTER — Encounter: Payer: Self-pay | Admitting: Internal Medicine

## 2018-03-12 MED ORDER — MONTELUKAST SODIUM 10 MG PO TABS: 10.0000 mg | ORAL_TABLET | Freq: Every day | ORAL | 2 refills | Status: DC

## 2018-03-13 ENCOUNTER — Other Ambulatory Visit: Payer: Self-pay | Admitting: Rheumatology

## 2018-03-13 NOTE — Telephone Encounter (Signed)
Last Visit: 11/02/17 Next Visit: 04/11/18 Labs: 01/29/18 Glucose is elevated. All other labs are WNL. PLQ Eye Exam: 06/09/17 WNL  Okay to refill per Dr. Estanislado Pandy

## 2018-03-22 ENCOUNTER — Ambulatory Visit: Payer: Self-pay | Admitting: Internal Medicine

## 2018-03-30 NOTE — Progress Notes (Signed)
Office Visit Note  Patient: Misty Curtis             Date of Birth: [birth year]           MRN: [id number]             PCP: Isaias Sakai, DO Referring: Helen Hashimoto., MD Visit Date: 04/11/2018 Occupation: @GUAROCC @    Subjective:  Pain in multiple joints   History of Present Illness: Shyniece Scripter is a 57 y.o. female with history of autoimmune disease.  She is taking Plaquenil 1 tablet in the morning and half tablet in the evening.  She also takes methotrexate 5 tablets by mouth once weekly and folic acid 2 mg daily.  Patient states that she developed a malar rash that lasted 3 days last week.  She continues to have sores in her mouth but no sores in her nose.  She has chronic sicca symptoms and symptoms of Raynaud's in her feet and hands.  She denies any digital ulcerations.  She reports chronic hair loss.  She states that she continues to have Achilles tendinitis in her right foot.  She states that she has been following stretching exercises on a regular basis.  She denies any joint swelling at this time.  She is been having pain in multiple joints including her bilateral wrists and bilateral hands. She states that over the past 3 weeks she has been having increased fluid retention in her bilateral lower extremities.   Activities of Daily Living:  Patient reports morning stiffness for 10-15   minutes.   Patient Denies nocturnal pain.  Difficulty dressing/grooming: Denies Difficulty climbing stairs: Reports Difficulty getting out of chair: Reports Difficulty using hands for taps, buttons, cutlery, and/or writing: Reports   Review of Systems  Constitutional: Negative for fatigue.  HENT: Positive for mouth sores and mouth dryness. Negative for nose dryness.   Eyes: Positive for dryness. Negative for pain and visual disturbance.  Respiratory: Negative for cough, hemoptysis, shortness of breath and difficulty breathing.   Cardiovascular: Positive for swelling in  legs/feet. Negative for chest pain, palpitations and hypertension.  Gastrointestinal: Negative for blood in stool, constipation and diarrhea.  Endocrine: Negative for increased urination.  Genitourinary: Negative for painful urination.  Musculoskeletal: Positive for arthralgias, joint pain and morning stiffness. Negative for joint swelling, myalgias, muscle weakness, muscle tenderness and myalgias.  Skin: Positive for color change and rash. Negative for pallor, hair loss, nodules/bumps, skin tightness, ulcers and sensitivity to sunlight.  Allergic/Immunologic: Negative for susceptible to infections.  Neurological: Negative for dizziness, numbness, headaches and weakness.  Hematological: Negative for swollen glands.  Psychiatric/Behavioral: Negative for depressed mood and sleep disturbance. The patient is not nervous/anxious.     PMFS History:  Patient Active Problem List   Diagnosis Date Noted  . Acquired hypothyroidism 12/08/2016  . Autoimmune disease ([place]) 12/06/2016  . Other fatigue 12/06/2016  . Raynaud's disease without gangrene 12/06/2016  . High risk medication use 12/06/2016  . Allergic asthma, mild intermittent, uncomplicated 82/95/6213  . History of hypertension 12/06/2016  . OSA (obstructive sleep apnea) 11/08/2016  . Excessive daytime sleepiness 07/26/2016  . Hypersomnia with sleep apnea 07/26/2016  . Snoring 07/26/2016  . Laryngitis 11/10/2012  . Postnasal drip 05/02/2012  . Acute sinusitis, unspecified 10/24/2011  . HYPERTENSION 12/14/2009  . BRONCHITIS 12/14/2009  . LUPUS 12/14/2009  . History of melanoma 12/14/2009  . Seasonal and perennial allergic rhinitis 12/14/2009    Past Medical History:  Diagnosis Date  . Allergic rhinitis  skin test 01-18-10  . Bronchitis   . Lupus (systemic lupus erythematosus) (HCC)    joints and skin rash. Dr. Estanislado Pandy  . Melanoma ([place])   . Rhinosinusitis   . Skin cancer     Family History  Problem Relation Age of Onset  .  Allergies Father   . Heart disease Father        bacterial endocarditis  . Cancer Maternal Grandmother   . Hypothyroidism Sister   . Hypothyroidism Sister   . Hashimoto's thyroiditis Sister   . Hepatitis Son    Past Surgical History:  Procedure Laterality Date  . ABDOMINAL HYSTERECTOMY    . KNEE ARTHROPLASTY    . skin cancer extraction     Social History   Social History Narrative  . Not on file     Objective: Vital Signs: BP (!) 169/90 (BP Location: Left Arm, Patient Position: Sitting, Cuff Size: Normal)   Pulse 74   Resp 15   Ht 5\' 3"  (1.6 m)   Wt 204 lb (92.5 kg)   BMI 36.14 kg/m    Physical Exam  Constitutional: She is oriented to person, place, and time. She appears well-developed and well-nourished.  HENT:  Head: Normocephalic and atraumatic.  Eyes: Conjunctivae and EOM are normal.  Neck: Normal range of motion.  Cardiovascular: Normal rate, regular rhythm, normal heart sounds and intact distal pulses.  Pulmonary/Chest: Effort normal and breath sounds normal.  Abdominal: Soft. Bowel sounds are normal.  Lymphadenopathy:    She has no cervical adenopathy.  Neurological: She is alert and oriented to person, place, and time.  Skin: Skin is warm and dry. Capillary refill takes less than 2 seconds.  Psychiatric: She has a normal mood and affect. Her behavior is normal.  Nursing note and vitals reviewed.    Musculoskeletal Exam: C-spine, thoracic spine, lumbar spine good range of motion.  No midline spinal tenderness.  No SI joint tenderness.  Shoulder joints, elbow joints, wrist joints, MCPs, PIPs, DIPs good range of motion no synovitis.  Hip joints, knee joints, ankle joints, MTPs, PIPs, DIPs good range of motion no synovitis.  No warmth or effusion of bilateral knees.  She has bilateral knee crepitus.  No tenderness of trochanteric bursa.  She has had and pedal edema bilaterally.  CDAI Exam: No CDAI exam completed.    Investigation: No additional findings.PLQ  eye exam: 06/09/2017 CBC Latest Ref Rng & Units 01/29/2018 09/29/2017 06/02/2017  WBC 3.8 - 10.8 Thousand/uL 9.2 9.3 7.4  Hemoglobin 11.7 - 15.5 g/dL 14.7 14.1 14.3  Hematocrit 35.0 - 45.0 % 42.1 41.2 42.7  Platelets 140 - 400 Thousand/uL 320 322 298   CMP Latest Ref Rng & Units 01/29/2018 09/29/2017 06/02/2017  Glucose 65 - 99 mg/dL 126(H) 97 109(H)  BUN 7 - 25 mg/dL 19 15 19   Creatinine 0.50 - 1.05 mg/dL 0.91 1.01 1.08(H)  Sodium 135 - 146 mmol/L 143 143 142  Potassium 3.5 - 5.3 mmol/L 3.6 3.8 3.9  Chloride 98 - 110 mmol/L 104 103 102  CO2 20 - 32 mmol/L 30 32 26  Calcium 8.6 - 10.4 mg/dL 9.5 9.5 9.5  Total Protein 6.1 - 8.1 g/dL 6.7 6.7 6.3  Total Bilirubin 0.2 - 1.2 mg/dL 0.3 0.4 0.4  Alkaline Phos 33 - 130 U/L - - 82  AST 10 - 35 U/L 25 20 20   ALT 6 - 29 U/L 16 12 12     Imaging: No results found.  Speciality Comments: PLQ eye exam:  06/09/2017  Procedures:  No procedures performed Allergies: Latex; Neomycin-bacitracin zn-polymyx; Onion; and Strawberry extract   Assessment / Plan:     Visit Diagnoses: Autoimmune disease ([place]) - History of positive ANA, DS DNA, Raynauds, fatigue, oral ulcers, nasal ulcers, arthralgias,sicca: Patient reports that she developed a malar rash that last for 3 days last week and resolved on its own.  She continues to have oral ulcers but no nasal ulcerations.  She continues to have symptoms of Raynaud's, sicca symptoms, and hair loss.  She has no synovitis on exam.  She experiences pain in multiple joints.  Autoimmune labs will be checked today.  She will continue taking Tylenol 1 tablet in the morning and half tablet in the evening, methotrexate 5 tablets weekly and folic acid 1 mg daily.- Plan: CBC with Differential/Platelet, COMPLETE METABOLIC PANEL WITH GFR, Urinalysis, Routine w reflex microscopic, ANA, Anti-DNA antibody, double-stranded, C3 and C4, Sedimentation rate, VITAMIN D 25 Hydroxy (Vit-D Deficiency, Fractures)  High risk medication use -  PLQ 1 tablet in the morning and half tablet in the evening, MTX 5 tablets weekly, folic acid 1 mg eye exam: 06/09/2017.  She was given a packet on exam form to take with her to her next appointment.  CBC and CMP will be drawn today to monitor for drug toxicity.  She will return in July and every 3 months for lab work.- Plan: CBC with Differential/Platelet, COMPLETE METABOLIC PANEL WITH GFR  Raynaud's disease without gangrene: She continues to have symptoms of raynaud's in her hands and feet.  She has no signs of digital ulcerations or gangrene.  She was encouraged to keep her core body temperature warm and wear gloves on a regular basis.  Right carpal tunnel syndrome: She continues to have symptoms of carpal tunnel in her right hand.  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  She has been working on stretching exercises on a regular basis.  Pedal edema: She has pitting edema bilaterally.  She has noticed increased fluid retention for the past 3 weeks. She was advised to follow up with her PCP.  She is not currently on a diuretic. In the meantime she can try compression stockings and elevation.   Other medical conditions are listed as well as:  History of melanoma - Followed up by dermatology  History of hypothyroidism  History of fatigue  History of sleep apnea  History of asthma  History of hypertension    Orders: Orders Placed This Encounter  Procedures  . CBC with Differential/Platelet  . COMPLETE METABOLIC PANEL WITH GFR  . Urinalysis, Routine w reflex microscopic  . ANA  . Anti-DNA antibody, double-stranded  . C3 and C4  . Sedimentation rate  . VITAMIN D 25 Hydroxy (Vit-D Deficiency, Fractures)   No orders of the defined types were placed in this encounter.   Face-to-face time spent with patient was 30 minutes. >50% of time was spent in counseling and coordination of care.  Follow-Up Instructions: Return for Autoimmune Disease.   Ofilia Neas, PA-C    I examined and evaluated the patient with Hazel Sams PA. The plan of care was discussed as noted above.  Bo Merino, MD  Note - This record has been created using Editor, commissioning.  Chart creation errors have been sought, but may not always  have been located. Such creation errors do not reflect on  the standard of medical care.

## 2018-04-11 ENCOUNTER — Ambulatory Visit: Payer: BC Managed Care – PPO | Admitting: Rheumatology

## 2018-04-11 ENCOUNTER — Encounter: Payer: Self-pay | Admitting: Rheumatology

## 2018-04-11 ENCOUNTER — Other Ambulatory Visit: Payer: Self-pay | Admitting: *Deleted

## 2018-04-11 VITALS — BP 169/90 | HR 74 | Resp 15 | Ht 63.0 in | Wt 204.0 lb

## 2018-04-11 DIAGNOSIS — Z85820 Personal history of malignant melanoma of skin: Secondary | ICD-10-CM

## 2018-04-11 DIAGNOSIS — I7300 Raynaud's syndrome without gangrene: Secondary | ICD-10-CM

## 2018-04-11 DIAGNOSIS — D8989 Other specified disorders involving the immune mechanism, not elsewhere classified: Secondary | ICD-10-CM | POA: Diagnosis not present

## 2018-04-11 DIAGNOSIS — Z87898 Personal history of other specified conditions: Secondary | ICD-10-CM

## 2018-04-11 DIAGNOSIS — M7661 Achilles tendinitis, right leg: Secondary | ICD-10-CM | POA: Diagnosis not present

## 2018-04-11 DIAGNOSIS — Z8679 Personal history of other diseases of the circulatory system: Secondary | ICD-10-CM

## 2018-04-11 DIAGNOSIS — G5601 Carpal tunnel syndrome, right upper limb: Secondary | ICD-10-CM | POA: Diagnosis not present

## 2018-04-11 DIAGNOSIS — R600 Localized edema: Secondary | ICD-10-CM

## 2018-04-11 DIAGNOSIS — Z8709 Personal history of other diseases of the respiratory system: Secondary | ICD-10-CM

## 2018-04-11 DIAGNOSIS — Z8669 Personal history of other diseases of the nervous system and sense organs: Secondary | ICD-10-CM

## 2018-04-11 DIAGNOSIS — Z79899 Other long term (current) drug therapy: Secondary | ICD-10-CM | POA: Diagnosis not present

## 2018-04-11 DIAGNOSIS — M359 Systemic involvement of connective tissue, unspecified: Secondary | ICD-10-CM

## 2018-04-11 DIAGNOSIS — Z8639 Personal history of other endocrine, nutritional and metabolic disease: Secondary | ICD-10-CM

## 2018-04-11 LAB — COMPLETE METABOLIC PANEL WITH GFR
AG Ratio: 1.8 (calc) (ref 1.0–2.5)
ALBUMIN MSPROF: 4.5 g/dL (ref 3.6–5.1)
ALT: 20 U/L (ref 6–29)
AST: 26 U/L (ref 10–35)
Alkaline phosphatase (APISO): 94 U/L (ref 33–130)
BUN: 14 mg/dL (ref 7–25)
CALCIUM: 9.5 mg/dL (ref 8.6–10.4)
CO2: 31 mmol/L (ref 20–32)
Chloride: 103 mmol/L (ref 98–110)
Creat: 0.87 mg/dL (ref 0.50–1.05)
GFR, EST NON AFRICAN AMERICAN: 73 mL/min/{1.73_m2} (ref >=60)
GFR, Est African American: 85 mL/min/{1.73_m2} (ref >=60)
GLUCOSE: 93 mg/dL (ref 65–99)
Globulin: 2.5 g/dL (calc) (ref 1.9–3.7)
Potassium: 4 mmol/L (ref 3.5–5.3)
Sodium: 141 mmol/L (ref 135–146)
Total Bilirubin: 0.4 mg/dL (ref 0.2–1.2)
Total Protein: 7 g/dL (ref 6.1–8.1)

## 2018-04-11 LAB — CBC WITH DIFFERENTIAL/PLATELET
BASOS ABS: 72 {cells}/uL (ref 0–200)
BASOS PCT: 0.7 %
Eosinophils Absolute: 196 cells/uL (ref 15–500)
Eosinophils Relative: 1.9 %
HCT: 41.9 % (ref 35.0–45.0)
HEMOGLOBIN: 14.4 g/dL (ref 11.7–15.5)
LYMPHS ABS: 3255 {cells}/uL (ref 850–3900)
MCH: 30 pg (ref 27.0–33.0)
MCHC: 34.4 g/dL (ref 32.0–36.0)
MCV: 87.3 fL (ref 80.0–100.0)
MONOS PCT: 8.6 %
MPV: 9.5 fL (ref 7.5–12.5)
Neutro Abs: 5892 cells/uL (ref 1500–7800)
Neutrophils Relative %: 57.2 %
PLATELETS: 314 10*3/uL (ref 140–400)
RBC: 4.8 10*6/uL (ref 3.80–5.10)
RDW: 13.6 % (ref 11.0–15.0)
TOTAL LYMPHOCYTE: 31.6 %
WBC mixed population: 886 cells/uL (ref 200–950)
WBC: 10.3 10*3/uL (ref 3.8–10.8)

## 2018-04-11 LAB — SEDIMENTATION RATE: Sed Rate: 6 mm/h (ref 0–30)

## 2018-04-11 MED ORDER — FOLIC ACID 1 MG PO TABS: 2.0000 mg | ORAL_TABLET | Freq: Every day | ORAL | 4 refills | Status: DC

## 2018-04-11 NOTE — Telephone Encounter (Signed)
Refill request received via fax  Last Visit: 11/02/17 Next Visit: 04/11/18   Okay to refill per Dr. Estanislado Pandy

## 2018-04-11 NOTE — Patient Instructions (Signed)
Standing Labs We placed an order today for your standing lab work.    Please come back and get your standing labs in   We have open lab Monday through Friday from 8:30-11:30 AM and 1:30-4:00 PM  at the office of Dr. Shaili Deveshwar.   You may experience shorter wait times on Monday and Friday afternoons. The office is located at [address], [address], [place], [place] [address] No appointment is necessary.   Labs are drawn by [facility].  You may receive a bill from [facility] for your lab work. If you have any questions regarding directions or hours of operation,  please call [phone].    

## 2018-04-12 ENCOUNTER — Ambulatory Visit: Payer: BC Managed Care – PPO | Admitting: Rheumatology

## 2018-04-12 ENCOUNTER — Other Ambulatory Visit: Payer: Self-pay | Admitting: Rheumatology

## 2018-04-12 LAB — ANTI-DNA ANTIBODY, DOUBLE-STRANDED: ds DNA Ab: 1 IU/mL

## 2018-04-12 LAB — URINALYSIS, ROUTINE W REFLEX MICROSCOPIC
Bilirubin Urine: NEGATIVE
Glucose, UA: NEGATIVE
Hgb urine dipstick: NEGATIVE
Ketones, ur: NEGATIVE
LEUKOCYTES UA: NEGATIVE
NITRITE: NEGATIVE
Protein, ur: NEGATIVE
SPECIFIC GRAVITY, URINE: 1.007 (ref 1.001–1.03)
pH: 7 (ref 5.0–8.0)

## 2018-04-12 LAB — C3 AND C4
C3 COMPLEMENT: 156 mg/dL (ref 83–193)
C4 COMPLEMENT: 28 mg/dL (ref 15–57)

## 2018-04-12 LAB — VITAMIN D 25 HYDROXY (VIT D DEFICIENCY, FRACTURES): VIT D 25 HYDROXY: 44 ng/mL (ref 30–100)

## 2018-04-12 NOTE — Telephone Encounter (Signed)
Last Visit: 01/22/23/19 Next Visit: 09/11/18  Labs: 04/11/18 cbc/cmp wnl PLQ Eye Exam: 06/09/2017 WNL  Okay to refill per Dr. Estanislado Pandy

## 2018-04-13 LAB — ANTI-NUCLEAR AB-TITER (ANA TITER): ANA Titer 1: 1:80 {titer} — ABNORMAL HIGH

## 2018-04-13 LAB — ANA: Anti Nuclear Antibody(ANA): POSITIVE — AB

## 2018-04-13 NOTE — Progress Notes (Signed)
ANA +, ANA titer unavailable.  All other labs are WNL.

## 2018-04-13 NOTE — Progress Notes (Signed)
ANA titer is stable.

## 2018-05-09 ENCOUNTER — Other Ambulatory Visit: Payer: Self-pay | Admitting: Rheumatology

## 2018-05-09 NOTE — Telephone Encounter (Signed)
Last visit: 04/11/2018 Next visit: 09/11/2018 Labs: 04/11/2018 WNL  Eye exam: 06/09/2017   Okay to refill per Dr. Estanislado Pandy.

## 2018-06-04 ENCOUNTER — Other Ambulatory Visit: Payer: Self-pay | Admitting: Internal Medicine

## 2018-06-06 ENCOUNTER — Other Ambulatory Visit: Payer: Self-pay | Admitting: Internal Medicine

## 2018-06-07 ENCOUNTER — Ambulatory Visit: Payer: BC Managed Care – PPO | Admitting: Internal Medicine

## 2018-06-07 ENCOUNTER — Encounter: Payer: Self-pay | Admitting: Internal Medicine

## 2018-06-07 DIAGNOSIS — B370 Candidal stomatitis: Secondary | ICD-10-CM

## 2018-06-07 DIAGNOSIS — J4520 Mild intermittent asthma, uncomplicated: Secondary | ICD-10-CM

## 2018-06-07 MED ORDER — FLUCONAZOLE 150 MG PO TABS: 150.0000 mg | ORAL_TABLET | Freq: Every day | ORAL | 2 refills | Status: DC

## 2018-06-07 MED ORDER — MONTELUKAST SODIUM 10 MG PO TABS: ORAL_TABLET | ORAL | 12 refills | Status: DC

## 2018-06-07 MED ORDER — MUPIROCIN 2 % EX OINT: TOPICAL_OINTMENT | CUTANEOUS | 12 refills | Status: DC

## 2018-06-07 MED ORDER — CLOTRIMAZOLE-BETAMETHASONE 1-0.05 % EX CREA: TOPICAL_CREAM | Freq: Two times a day (BID) | CUTANEOUS | 99 refills | Status: DC

## 2018-06-07 MED ORDER — BUDESONIDE-FORMOTEROL FUMARATE 160-4.5 MCG/ACT IN AERO: INHALATION_SPRAY | RESPIRATORY_TRACT | 12 refills | Status: DC

## 2018-06-07 MED ORDER — ALBUTEROL SULFATE HFA 108 (90 BASE) MCG/ACT IN AERS: 2.0000 | INHALATION_SPRAY | Freq: Four times a day (QID) | RESPIRATORY_TRACT | 12 refills | Status: DC | PRN

## 2018-06-07 MED ORDER — AEROCHAMBER MV MISC: 0 refills | Status: DC

## 2018-06-07 NOTE — Patient Instructions (Addendum)
Scripts for med refills sent  Script for diflucan for thrush in mouth sent  Printed script for aerochamber spacer tube     Use this with your Symbicort inhaler  Please call if we can help

## 2018-06-07 NOTE — Progress Notes (Signed)
Patient ID: Misty Curtis, female    DOB: 19-Oct-1961, 57 y.o.   MRN: [id number]  HPI female never smoker followed for allergy, bronchitis, complicated by HBP, lupus,   --------------------------------------------------- 01/02/17-  female never smoker followed for allergy, bronchitis, complicated by HBP, lupus,  ( [place]) FOLLOWS FOR:Pt states her breathing holds well until about Sept-started nasal spray. Had sleep study as well-uses CPAP machine(Dr Dohemier started this). Also using Nuvigil  Chronic cold weather managed by using saline and Flonase. Uses a fullface mask with her CPAP machine. Rare wheeze. Symbicort has been sufficient with rare use of rescue inhaler.  06/07/2018- female never smoker followed for allergy, bronchitis, complicated by HBP, lupus, ( [place]) -----Bronchitis: Pt states she is doing well-slight SOB and wheezing with hot weather.  Symbicort 160, Singulair, also note modafinil (Neurology), methotrexate Sleep apnea was ruled out- fatigue attributed to her Lupus.  Not wheezing at night and no significant exacerbations. Humidity bothers.  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  Review of Systems-see HPI  + = positive Constitutional:   No-   weight loss, night sweats, fevers, chills, fatigue, lassitude. HEENT:   No-  headaches, difficulty swallowing, tooth/dental problems, sore throat,       No-  sneezing, itching, +ear ache, +nasal congestion, +post nasal drip,  CV:  No-   chest pain, orthopnea, PND, swelling in lower extremities, anasarca, dizziness, palpitations Resp: No-   shortness of breath with exertion or at rest.              No-   productive cough,  No non-productive cough,  No- coughing up of blood.              No-   change in color of mucus.  Little wheezing.   Skin: +rash or lesions. GI:  No-   heartburn, indigestion, abdominal pain, nausea, vomiting,  GU:  MS:  No-   joint pain or swelling.  Neuro-     nothing unusual Psych:  No- change in  mood or affect. No depression or anxiety.  No memory loss.  Objective:   Physical Exam General- Alert, Oriented, Affect-appropriate, Distress- none acute, + overweight Skin- rash-none, lesions- none, excoriation- none Lymphadenopathy- none Head- atraumatic            Eyes- Gross vision intact, PERRLA, conjunctivae clear secretions            Ears- Hearing, canals-normal. TMs look normal            Nose- clear, no-Septal dev, mucus, polyps, erosion, perforation             Throat- Mallampati II , mucosa + thrush, drainage- none, tonsils- atrophic.                    No visible postnasal drainage and minimal redness of her pharynx.                  Neck- flexible , trachea midline, no stridor , thyroid nl, carotid no bruit Chest - symmetrical excursion , unlabored           Heart/CV- RRR , no murmur , no gallop  , no rub, nl s1 s2                           - JVD- none , edema- none, stasis changes- none, varices- none           Lung- clear to  P&A, wheeze- none, cough- none , dullness-none, rub- none           Chest wall-  Abd-  Br/ Gen/ Rectal- Not done, not indicated Extrem- cyanosis- none, clubbing, none, atrophy- none, strength- nl Neuro- grossly intact to observation

## 2018-06-11 DIAGNOSIS — B370 Candidal stomatitis: Secondary | ICD-10-CM | POA: Insufficient documentation

## 2018-06-11 NOTE — Assessment & Plan Note (Signed)
Attributed to Symbicort inhaler Plan- treat with Diflucan and introduce aerochamber spacer

## 2018-06-11 NOTE — Assessment & Plan Note (Signed)
Symbicort is controlling well, with only occ need for rescue inhaler Plan- refill Symbicort, adding aerochamber

## 2018-06-18 ENCOUNTER — Encounter: Payer: Self-pay | Admitting: Internal Medicine

## 2018-06-19 MED ORDER — FLUTICASONE PROPIONATE 50 MCG/ACT NA SUSP: 2.0000 | Freq: Every day | NASAL | 5 refills | Status: DC

## 2018-07-05 ENCOUNTER — Other Ambulatory Visit: Payer: Self-pay | Admitting: Internal Medicine

## 2018-07-25 ENCOUNTER — Other Ambulatory Visit: Payer: Self-pay | Admitting: Rheumatology

## 2018-07-25 MED ORDER — METHOTREXATE 2.5 MG PO TABS: ORAL_TABLET | ORAL | 0 refills | Status: DC

## 2018-07-25 NOTE — Telephone Encounter (Signed)
Last visit: 04/11/2018 Next visit: 09/11/2018 Labs: 04/11/2018 WNL   Left message to advise patient she is due for labs   Okay to refill 30 day supply per Dr. Estanislado Pandy

## 2018-08-12 ENCOUNTER — Other Ambulatory Visit: Payer: Self-pay | Admitting: Rheumatology

## 2018-08-13 NOTE — Telephone Encounter (Signed)
Last visit: 04/11/2018 Next visit: 09/11/2018  Okay to refill per Dr. Estanislado Pandy

## 2018-08-30 ENCOUNTER — Other Ambulatory Visit: Payer: Self-pay | Admitting: Rheumatology

## 2018-08-30 NOTE — Progress Notes (Deleted)
Office Visit Note  Patient: Misty Curtis             Date of Birth: Mar 16, 1961           MRN: [id number]             PCP: Isaias Sakai, DO Referring: Alonna Buckler* Visit Date: 09/11/2018 Occupation: @GUAROCC @  Subjective:  No chief complaint on file.   History of Present Illness: Misty Curtis is a 57 y.o. female ***   Activities of Daily Living:  Patient reports morning stiffness for *** {minute/hour:19697}.   Patient {ACTIONS;DENIES/REPORTS:21021675::"Denies"} nocturnal pain.  Difficulty dressing/grooming: {ACTIONS;DENIES/REPORTS:21021675::"Denies"} Difficulty climbing stairs: {ACTIONS;DENIES/REPORTS:21021675::"Denies"} Difficulty getting out of chair: {ACTIONS;DENIES/REPORTS:21021675::"Denies"} Difficulty using hands for taps, buttons, cutlery, and/or writing: {ACTIONS;DENIES/REPORTS:21021675::"Denies"}  No Rheumatology ROS completed.   PMFS History:  Patient Active Problem List   Diagnosis Date Noted  . Thrush 06/11/2018  . Acquired hypothyroidism 12/08/2016  . Autoimmune disease ([place]) 12/06/2016  . Other fatigue 12/06/2016  . Raynaud's disease without gangrene 12/06/2016  . High risk medication use 12/06/2016  . Allergic asthma, mild intermittent, uncomplicated 14/78/2956  . History of hypertension 12/06/2016  . Excessive daytime sleepiness 07/26/2016  . Snoring 07/26/2016  . Laryngitis 11/10/2012  . Postnasal drip 05/02/2012  . Acute sinusitis, unspecified 10/24/2011  . HYPERTENSION 12/14/2009  . BRONCHITIS 12/14/2009  . LUPUS 12/14/2009  . History of melanoma 12/14/2009  . Seasonal and perennial allergic rhinitis 12/14/2009    Past Medical History:  Diagnosis Date  . Allergic rhinitis    skin test 01-18-10  . Bronchitis   . Lupus (systemic lupus erythematosus) (HCC)    joints and skin rash. Dr. Estanislado Pandy  . Melanoma ([place])   . Rhinosinusitis   . Skin cancer     Family History  Problem Relation Age of Onset  . Allergies Father     . Heart disease Father        bacterial endocarditis  . Cancer Maternal Grandmother   . Hypothyroidism Sister   . Hypothyroidism Sister   . Hashimoto's thyroiditis Sister   . Hepatitis Son    Past Surgical History:  Procedure Laterality Date  . ABDOMINAL HYSTERECTOMY    . KNEE ARTHROPLASTY    . skin cancer extraction     Social History   Social History Narrative  . Not on file    Objective: Vital Signs: There were no vitals taken for this visit.   Physical Exam   Musculoskeletal Exam: ***  CDAI Exam: CDAI Score: Not documented Patient Global Assessment: Not documented; Provider Global Assessment: Not documented Swollen: Not documented; Tender: Not documented Joint Exam   Not documented   There is currently no information documented on the homunculus. Go to the Rheumatology activity and complete the homunculus joint exam.  Investigation: No additional findings.  Imaging: No results found.  Recent Labs: Lab Results  Component Value Date   WBC 10.3 04/11/2018   HGB 14.4 04/11/2018   PLT 314 04/11/2018   NA 141 04/11/2018   K 4.0 04/11/2018   CL 103 04/11/2018   CO2 31 04/11/2018   GLUCOSE 93 04/11/2018   BUN 14 04/11/2018   CREATININE 0.87 04/11/2018   BILITOT 0.4 04/11/2018   ALKPHOS 82 06/02/2017   AST 26 04/11/2018   ALT 20 04/11/2018   PROT 7.0 04/11/2018   ALBUMIN 4.1 06/02/2017   CALCIUM 9.5 04/11/2018   GFRAA 85 04/11/2018    Speciality Comments: PLQ eye exam:  06/09/2017  Procedures:  No procedures performed Allergies:  Latex; Neomycin-bacitracin zn-polymyx; Onion; and Strawberry extract   Assessment / Plan:     Visit Diagnoses: No diagnosis found.   Orders: No orders of the defined types were placed in this encounter.  No orders of the defined types were placed in this encounter.   Face-to-face time spent with patient was *** minutes. Greater than 50% of time was spent in counseling and coordination of care.  Follow-Up  Instructions: No follow-ups on file.   Earnestine Mealing, CMA  Note - This record has been created using Editor, commissioning.  Chart creation errors have been sought, but may not always  have been located. Such creation errors do not reflect on  the standard of medical care.

## 2018-08-31 NOTE — Telephone Encounter (Signed)
Last visit: 04/11/2018 Next visit: 09/11/2018 Labs: 07/09/18 stable  Okay to refill per Dr. Estanislado Pandy

## 2018-09-07 NOTE — Progress Notes (Signed)
Office Visit Note  Patient: Misty Curtis             Date of Birth: [birth year]           MRN: [id number]             PCP: Isaias Sakai, DO Referring: Alonna Buckler* Visit Date: 09/14/2018 Occupation: @GUAROCC @  Subjective:  Hair loss   History of Present Illness: Yazmeen Woolf is a 57 y.o. female with history of autoimmune disease.  She is on PLQ 1 tablet by mouth in the morning and 1/2 tablet by mouth in the evening, MTX 5 tablets by mouth once a week, and folic acid 1 mg daily. She states that she has been having worsening bilateral wrist pain and mild swelling.  She has been sleeping with carpal tunnel braces at night.  She continues to have right achilles tendonitis but denies any plantar fasciitis.  She states she is having discomfort in the left lower back/buttock region. She denies any knee joint pain or swelling at this time.  She reports she has intermittent oral ulcerations.  She denies any recent facial rashes.  She reports worsening hair loss over the past few weeks.  She continues to have photosensitivity.  She has chronic fatigue but denies any low grade fevers.  She denies any shortness of breath or palpitations.  She intermittently states she has swollen axillary lymph nodes.  She continues to intermittent symptoms of Raynaud's in fingers and toes. She denies any digital ulcerations.  She continues to have sicca symptoms.     Activities of Daily Living:  Patient reports morning stiffness for 10-15 minutes.   Patient Reports nocturnal pain.  Difficulty dressing/grooming: Denies Difficulty climbing stairs: Denies Difficulty getting out of chair: Denies Difficulty using hands for taps, buttons, cutlery, and/or writing: Reports  Review of Systems  Constitutional: Positive for fatigue.  HENT: Positive for mouth sores and mouth dryness. Negative for nose dryness.   Eyes: Positive for dryness. Negative for pain and visual disturbance.  Respiratory: Negative  for cough, hemoptysis, shortness of breath and difficulty breathing.   Cardiovascular: Negative for chest pain, palpitations, hypertension and swelling in legs/feet.  Gastrointestinal: Negative for blood in stool, constipation and diarrhea.  Endocrine: Negative for increased urination.  Genitourinary: Negative for painful urination.  Musculoskeletal: Positive for arthralgias, joint pain, joint swelling and morning stiffness. Negative for myalgias, muscle weakness, muscle tenderness and myalgias.  Skin: Positive for color change and hair loss. Negative for pallor, rash, nodules/bumps, skin tightness, ulcers and sensitivity to sunlight.  Allergic/Immunologic: Negative for susceptible to infections.  Neurological: Negative for dizziness, numbness, headaches and weakness.  Hematological: Positive for swollen glands.  Psychiatric/Behavioral: Negative for depressed mood and sleep disturbance. The patient is not nervous/anxious.     PMFS History:  Patient Active Problem List   Diagnosis Date Noted  . Thrush 06/11/2018  . Acquired hypothyroidism 12/08/2016  . Autoimmune disease ([place]) 12/06/2016  . Other fatigue 12/06/2016  . Raynaud's disease without gangrene 12/06/2016  . High risk medication use 12/06/2016  . Allergic asthma, mild intermittent, uncomplicated 44/31/5400  . History of hypertension 12/06/2016  . Excessive daytime sleepiness 07/26/2016  . Snoring 07/26/2016  . Laryngitis 11/10/2012  . Postnasal drip 05/02/2012  . Acute sinusitis, unspecified 10/24/2011  . HYPERTENSION 12/14/2009  . BRONCHITIS 12/14/2009  . LUPUS 12/14/2009  . History of melanoma 12/14/2009  . Seasonal and perennial allergic rhinitis 12/14/2009    Past Medical History:  Diagnosis Date  . Allergic  rhinitis    skin test 01-18-10  . Bronchitis   . Lupus (systemic lupus erythematosus) (HCC)    joints and skin rash. Dr. Estanislado Pandy  . Melanoma ([place])   . Rhinosinusitis   . Skin cancer     Family History    Problem Relation Age of Onset  . Allergies Father   . Heart disease Father        bacterial endocarditis  . Cancer Maternal Grandmother   . Hypothyroidism Sister   . Hypothyroidism Sister   . Hashimoto's thyroiditis Sister   . Hepatitis Son    Past Surgical History:  Procedure Laterality Date  . ABDOMINAL HYSTERECTOMY    . KNEE ARTHROPLASTY    . skin cancer extraction     Social History   Social History Narrative  . Not on file    Objective: Vital Signs: BP (!) 149/90 (BP Location: Left Arm, Patient Position: Sitting, Cuff Size: Normal)   Pulse 77   Resp 14   Ht 5\' 3"  (1.6 m)   Wt 201 lb 9.6 oz (91.4 kg)   BMI 35.71 kg/m    Physical Exam  Constitutional: She is oriented to person, place, and time. She appears well-developed and well-nourished.  HENT:  Head: Normocephalic and atraumatic.  Eyes: Conjunctivae and EOM are normal.  Neck: Normal range of motion.  Cardiovascular: Normal rate, regular rhythm, normal heart sounds and intact distal pulses.  Pulmonary/Chest: Effort normal and breath sounds normal.  Abdominal: Soft. Bowel sounds are normal.  Lymphadenopathy:    She has no cervical adenopathy.  Neurological: She is alert and oriented to person, place, and time.  Skin: Skin is warm and dry. Capillary refill takes less than 2 seconds.  Psychiatric: She has a normal mood and affect. Her behavior is normal.  Nursing note and vitals reviewed.    Musculoskeletal Exam: C-spine, thoracic spine, and lumbar spine good ROM.  No midline spinal tenderness.  No SI joint tenderness.  Shoulder joints, elbow joints, wrist joints, MCPs, PIPs, and DIPs good ROM with no synovitis.  Complete fist formation.  Hip joints, knee joints, ankle joints, MTPs, PIPs, and DIPs good ROM with no synovitis.  No warmth or effusion. No tenderness of trochanteric bursa bilaterally.   CDAI Exam: CDAI Score: Not documented Patient Global Assessment: Not documented; Provider Global Assessment: Not  documented Swollen: 0 ; Tender: 3  Joint Exam      Right  Left  PIP 3   Tender     Ankle   Tender     Subtalar   Tender      There is currently no information documented on the homunculus. Go to the Rheumatology activity and complete the homunculus joint exam.  Investigation: No additional findings.  Imaging: No results found.  Recent Labs: Lab Results  Component Value Date   WBC 10.3 04/11/2018   HGB 14.4 04/11/2018   PLT 314 04/11/2018   NA 141 04/11/2018   K 4.0 04/11/2018   CL 103 04/11/2018   CO2 31 04/11/2018   GLUCOSE 93 04/11/2018   BUN 14 04/11/2018   CREATININE 0.87 04/11/2018   BILITOT 0.4 04/11/2018   ALKPHOS 82 06/02/2017   AST 26 04/11/2018   ALT 20 04/11/2018   PROT 7.0 04/11/2018   ALBUMIN 4.1 06/02/2017   CALCIUM 9.5 04/11/2018   GFRAA 85 04/11/2018    Speciality Comments: PLQ eye exam:  06/09/2017  Procedures:  No procedures performed Allergies: Latex; Neomycin-bacitracin zn-polymyx; Onion; and Strawberry extract  Assessment / Plan:     Visit Diagnoses: Autoimmune disease ([place]) - History of positive ANA, DS DNA, Raynauds, fatigue, oral ulcers, nasal ulcers, arthralgias,sicca: She has no synovitis on exam today.  She continues to have intermittent oral ulcerations but no nasal ulcerations.  She has chronic sicca symptoms.  She has no parotid swelling on exam. No cervical lymphadenopathy was palpable.  She continues to have symptoms of Raynaud's.  No digital ulcerations were noted.  She has chronic fatigue but no recent low grade fevers.  No recent malar rashes.  She continues to have photosensitivity and hair loss.  We will check autoimmune labs today.  She will continue taking PLQ 200 mg 1 tablet in the morning and 1/2 tablet in the evening, MTX 5 tablets by mouth once a week, and folic acid 1 mg daily. She does not need any refills.  She will follow up in 5 months.  She was advised to notify us if she develops any new or worsening symptoms. - Plan:  COMPLETE METABOLIC PANEL WITH GFR, CBC with Differential/Platelet, Urinalysis, Routine w reflex microscopic, C3 and C4, Anti-DNA antibody, double-stranded, Sedimentation rate  High risk medication use - PLQ 1 tablet in the morning and half tablet in the evening, MTX 5 tablets weekly, folic acid 1 mg eye exam: 06/09/2017.  CBC and CMP were drawn today to monitor for drug toxicity.- Plan: COMPLETE METABOLIC PANEL WITH GFR, CBC with Differential/Platelet  Raynaud's disease without gangrene: She continues to have intermittent symptoms of Raynaud's in fingers and toes.  No digital ulcerations were noted on exam.  The importance of keeping core body temperature warm was discussed.   Right carpal tunnel syndrome: She wears a brace at night.  She remains opposed to proceeding with carpal tunnel release.    Achilles tendinitis of right lower extremity: She continues to have tenderness and tightness of the right achilles tendon.  She has been performing stretching exercises regularly.  She does not have symptoms of plantar fasciitis at this time.   Other medical conditions are listed as follows:   History of melanoma  History of hypothyroidism  History of fatigue  History of sleep apnea  History of asthma  History of hypertension  Other fatigue  Abnormal serum protein electrophoresis  Acquired hypothyroidism  Pedal edema   Orders: Orders Placed This Encounter  Procedures  . COMPLETE METABOLIC PANEL WITH GFR  . CBC with Differential/Platelet  . Urinalysis, Routine w reflex microscopic  . C3 and C4  . Anti-DNA antibody, double-stranded  . Sedimentation rate   No orders of the defined types were placed in this encounter.  Follow-Up Instructions: Return in about 6 months (around 03/15/2019) for Autoimmune Disease.   Ofilia Neas, PA-C  Note - This record has been created using Dragon software.  Chart creation errors have been sought, but may not always  have been located. Such  creation errors do not reflect on  the standard of medical care.

## 2018-09-09 ENCOUNTER — Other Ambulatory Visit: Payer: Self-pay | Admitting: Adult Health

## 2018-09-11 ENCOUNTER — Telehealth: Payer: Self-pay | Admitting: *Deleted

## 2018-09-11 ENCOUNTER — Ambulatory Visit: Payer: BC Managed Care – PPO | Admitting: Rheumatology

## 2018-09-11 NOTE — Telephone Encounter (Addendum)
Started PA for Modafinil on CMM. Key [id number]. She is diagnosed with OSA, failed Nuvigil.

## 2018-09-11 NOTE — Telephone Encounter (Signed)
Modafinil approved, 09/11/18 through 09/12/19.  Approval letter faxed to [place], [place].

## 2018-09-14 ENCOUNTER — Ambulatory Visit: Payer: BC Managed Care – PPO | Admitting: Physician Assistant

## 2018-09-14 ENCOUNTER — Encounter: Payer: Self-pay | Admitting: Physician Assistant

## 2018-09-14 VITALS — BP 149/90 | HR 77 | Resp 14 | Ht 63.0 in | Wt 201.6 lb

## 2018-09-14 DIAGNOSIS — Z8669 Personal history of other diseases of the nervous system and sense organs: Secondary | ICD-10-CM

## 2018-09-14 DIAGNOSIS — Z8679 Personal history of other diseases of the circulatory system: Secondary | ICD-10-CM

## 2018-09-14 DIAGNOSIS — Z79899 Other long term (current) drug therapy: Secondary | ICD-10-CM | POA: Diagnosis not present

## 2018-09-14 DIAGNOSIS — Z85820 Personal history of malignant melanoma of skin: Secondary | ICD-10-CM

## 2018-09-14 DIAGNOSIS — M359 Systemic involvement of connective tissue, unspecified: Secondary | ICD-10-CM

## 2018-09-14 DIAGNOSIS — E039 Hypothyroidism, unspecified: Secondary | ICD-10-CM

## 2018-09-14 DIAGNOSIS — Z8639 Personal history of other endocrine, nutritional and metabolic disease: Secondary | ICD-10-CM

## 2018-09-14 DIAGNOSIS — M7661 Achilles tendinitis, right leg: Secondary | ICD-10-CM

## 2018-09-14 DIAGNOSIS — I7300 Raynaud's syndrome without gangrene: Secondary | ICD-10-CM

## 2018-09-14 DIAGNOSIS — Z87898 Personal history of other specified conditions: Secondary | ICD-10-CM

## 2018-09-14 DIAGNOSIS — G5601 Carpal tunnel syndrome, right upper limb: Secondary | ICD-10-CM | POA: Diagnosis not present

## 2018-09-14 DIAGNOSIS — Z8709 Personal history of other diseases of the respiratory system: Secondary | ICD-10-CM

## 2018-09-14 DIAGNOSIS — R5383 Other fatigue: Secondary | ICD-10-CM

## 2018-09-14 DIAGNOSIS — D8989 Other specified disorders involving the immune mechanism, not elsewhere classified: Secondary | ICD-10-CM

## 2018-09-14 DIAGNOSIS — R600 Localized edema: Secondary | ICD-10-CM

## 2018-09-14 DIAGNOSIS — R778 Other specified abnormalities of plasma proteins: Secondary | ICD-10-CM

## 2018-09-17 LAB — URINALYSIS, ROUTINE W REFLEX MICROSCOPIC
BILIRUBIN URINE: NEGATIVE
GLUCOSE, UA: NEGATIVE
Hgb urine dipstick: NEGATIVE
Ketones, ur: NEGATIVE
Leukocytes, UA: NEGATIVE
Nitrite: NEGATIVE
PROTEIN: NEGATIVE
Specific Gravity, Urine: 1.018 (ref 1.001–1.03)
pH: >=8.5 — AB (ref 5.0–8.0)

## 2018-09-17 LAB — CBC WITH DIFFERENTIAL/PLATELET
Basophils Absolute: 37 cells/uL (ref 0–200)
Basophils Relative: 0.5 %
EOS PCT: 1.9 %
Eosinophils Absolute: 141 cells/uL (ref 15–500)
HCT: 42.8 % (ref 35.0–45.0)
Hemoglobin: 14.5 g/dL (ref 11.7–15.5)
Lymphs Abs: 2109 cells/uL (ref 850–3900)
MCH: 30.3 pg (ref 27.0–33.0)
MCHC: 33.9 g/dL (ref 32.0–36.0)
MCV: 89.4 fL (ref 80.0–100.0)
MPV: 9.4 fL (ref 7.5–12.5)
Monocytes Relative: 7.5 %
NEUTROS PCT: 61.6 %
Neutro Abs: 4558 cells/uL (ref 1500–7800)
PLATELETS: 323 10*3/uL (ref 140–400)
RBC: 4.79 10*6/uL (ref 3.80–5.10)
RDW: 13.7 % (ref 11.0–15.0)
TOTAL LYMPHOCYTE: 28.5 %
WBC: 7.4 10*3/uL (ref 3.8–10.8)
WBCMIX: 555 {cells}/uL (ref 200–950)

## 2018-09-17 LAB — COMPLETE METABOLIC PANEL WITH GFR
AG Ratio: 1.8 (calc) (ref 1.0–2.5)
ALBUMIN MSPROF: 4.2 g/dL (ref 3.6–5.1)
ALKALINE PHOSPHATASE (APISO): 94 U/L (ref 33–130)
ALT: 22 U/L (ref 6–29)
AST: 31 U/L (ref 10–35)
BILIRUBIN TOTAL: 0.4 mg/dL (ref 0.2–1.2)
BUN: 19 mg/dL (ref 7–25)
CHLORIDE: 103 mmol/L (ref 98–110)
CO2: 32 mmol/L (ref 20–32)
Calcium: 9.9 mg/dL (ref 8.6–10.4)
Creat: 1.01 mg/dL (ref 0.50–1.05)
GFR, Est African American: 71 mL/min/{1.73_m2} (ref >=60)
GFR, Est Non African American: 61 mL/min/{1.73_m2} (ref >=60)
GLOBULIN: 2.4 g/dL (ref 1.9–3.7)
Glucose, Bld: 101 mg/dL — ABNORMAL HIGH (ref 65–99)
Potassium: 4.1 mmol/L (ref 3.5–5.3)
SODIUM: 142 mmol/L (ref 135–146)
Total Protein: 6.6 g/dL (ref 6.1–8.1)

## 2018-09-17 LAB — C3 AND C4
C3 Complement: 155 mg/dL (ref 83–193)
C4 Complement: 30 mg/dL (ref 15–57)

## 2018-09-17 LAB — SEDIMENTATION RATE: Sed Rate: 6 mm/h (ref 0–30)

## 2018-09-17 LAB — ANTI-DNA ANTIBODY, DOUBLE-STRANDED: ds DNA Ab: 1 IU/mL

## 2018-09-17 MED ORDER — CLOTRIMAZOLE-BETAMETHASONE 1-0.05 % EX CREA: TOPICAL_CREAM | Freq: Two times a day (BID) | CUTANEOUS | 99 refills | Status: DC

## 2018-09-17 NOTE — Addendum Note (Signed)
Addended by: Jannette Spanner on: 09/17/2018 11:17 AM   Modules accepted: Orders

## 2018-09-17 NOTE — Progress Notes (Signed)
UA revealed elevated pH.  Please forward to PCP.  All other labs are WNL.

## 2018-09-17 NOTE — Telephone Encounter (Signed)
This probably came from PCP or Dermatologist.Ok for [place] to refill it if they can't.

## 2018-09-17 NOTE — Telephone Encounter (Signed)
Dr. Annamaria Boots, pt is requesting a refill on Lotrisone cream. Can we refill?  Current Outpatient Medications on File Prior to Visit  Medication Sig Dispense Refill  . albuterol (PROVENTIL HFA;VENTOLIN HFA) 108 (90 Base) MCG/ACT inhaler Inhale 2 puffs into the lungs every 6 (six) hours as needed for wheezing or shortness of breath. 1 Inhaler 12  . Ascorbic Acid (VITAMIN C) 500 MG tablet Take 500 mg by mouth daily.      . B Complex Vitamins (VITAMIN B COMPLEX PO) Take by mouth.    Marland Kitchen BIOTENE DRY MOUTH (BIOTENE) LIQD Place 1 application onto teeth as needed.      . budesonide-formoterol (SYMBICORT) 160-4.5 MCG/ACT inhaler INHALE 2 PUFFS INTO THE LUNGS TWICE DAILY. RINSE MOUTH AFTER USE 10.2 g 12  . Cholecalciferol (VITAMIN D) 2000 units CAPS Take by mouth.    . clotrimazole-betamethasone (LOTRISONE) cream Apply topically 2 (two) times daily. 30 g prn  . Digestive Enzymes (DIGESTIVE ENZYME PO) Take by mouth.    . fluticasone (FLONASE) 50 MCG/ACT nasal spray Place 2 sprays into both nostrils daily. 16 g 5  . folic acid (FOLVITE) 1 MG tablet Take 2 tablets (2 mg total) by mouth daily. 180 tablet 4  . hydroxychloroquine (PLAQUENIL) 200 MG tablet TAKE 1 TABLET(200 MG) BY MOUTH TWICE DAILY (Patient taking differently: Take 300 mg by mouth daily. ) 60 tablet 1  . lactase (LACTAID) 3000 UNITS tablet Take by mouth as needed.     Marland Kitchen levothyroxine (SYNTHROID) 50 MCG tablet TK 1 T PO QAM FOR THYROID    . lisinopril (PRINIVIL,ZESTRIL) 5 MG tablet Take 5 mg by mouth daily.    Marland Kitchen loratadine (CLARITIN) 10 MG tablet Take 10 mg by mouth daily.      . methotrexate (RHEUMATREX) 2.5 MG tablet TAKE 5 TABLETS BY MOUTH 1 TIME WEEKLY 60 tablet 0  . modafinil (PROVIGIL) 200 MG tablet TAKE 1 TABLET BY MOUTH EVERY DAY 90 tablet 0  . montelukast (SINGULAIR) 10 MG tablet 1 daily 30 tablet 12  . mupirocin ointment (BACTROBAN) 2 % Apply twice daily as directed 22 g 12  . nabumetone (RELAFEN) 750 MG tablet Take 750 mg by mouth 2 (two)  times daily as needed.     . NON FORMULARY Doterra Essential Oils:  Eucalyptus, [place], Lemongrass, [place], [place], [employer]    . ondansetron (ZOFRAN) 4 MG tablet TAKE 1 TABLET BY MOUTH EVERY 6 HOURS AS NEEDED FOR NAUSEA 30 tablet 0  . phenylephrine (SUDAFED PE) 10 MG TABS tablet Take 10 mg by mouth every 4 (four) hours as needed.    Marland Kitchen Spacer/Aero-Holding Chambers (AEROCHAMBER MV) inhaler Use as instructed 1 each 0  . Specialty Vitamins Products (CENTRUM PERFORMANCE) TABS Take 1 tablet by mouth daily.       No current facility-administered medications on file prior to visit.    Allergies  Allergen Reactions  . Latex Other (See Comments)  . Neomycin-Bacitracin Zn-Polymyx     REACTION: rash  . Onion     Also allergic to peppers and hot peppers  . Strawberry Extract

## 2018-11-30 ENCOUNTER — Other Ambulatory Visit: Payer: Self-pay | Admitting: Internal Medicine

## 2018-12-01 ENCOUNTER — Encounter: Payer: Self-pay | Admitting: Rheumatology

## 2018-12-03 MED ORDER — HYDROXYCHLOROQUINE SULFATE 200 MG PO TABS: 300.0000 mg | ORAL_TABLET | Freq: Every day | ORAL | 0 refills | Status: DC

## 2018-12-03 NOTE — Telephone Encounter (Signed)
Last Visit: 09/14/18 Next Visit: 03/15/19 Labs: 09/14/18 UA revealed elevated pH. All other labs are WNL. Eye exam: 06/09/2017   Okay to refill 30 day supply per Dr. Estanislado Pandy

## 2018-12-23 ENCOUNTER — Other Ambulatory Visit: Payer: Self-pay | Admitting: Rheumatology

## 2018-12-24 NOTE — Telephone Encounter (Addendum)
Last Visit: 09/14/18 Next visit: 03/15/19 Labs: 09/14/18 cbc/cmp  Left message to advise patient she is due to update labs.   Okay to refill 30 day supply per Dr. Estanislado Pandy

## 2018-12-26 ENCOUNTER — Encounter: Payer: Self-pay | Admitting: Adult Health

## 2018-12-28 ENCOUNTER — Other Ambulatory Visit: Payer: Self-pay

## 2018-12-28 DIAGNOSIS — Z79899 Other long term (current) drug therapy: Secondary | ICD-10-CM

## 2018-12-29 LAB — COMPLETE METABOLIC PANEL WITH GFR
AG RATIO: 1.8 (calc) (ref 1.0–2.5)
ALBUMIN MSPROF: 4.4 g/dL (ref 3.6–5.1)
ALT: 21 U/L (ref 6–29)
AST: 29 U/L (ref 10–35)
Alkaline phosphatase (APISO): 93 U/L (ref 33–130)
BILIRUBIN TOTAL: 0.5 mg/dL (ref 0.2–1.2)
BUN: 14 mg/dL (ref 7–25)
CHLORIDE: 100 mmol/L (ref 98–110)
CO2: 30 mmol/L (ref 20–32)
Calcium: 9.4 mg/dL (ref 8.6–10.4)
Creat: 0.93 mg/dL (ref 0.50–1.05)
GFR, EST AFRICAN AMERICAN: 78 mL/min/{1.73_m2} (ref >=60)
GFR, EST NON AFRICAN AMERICAN: 67 mL/min/{1.73_m2} (ref >=60)
GLOBULIN: 2.4 g/dL (ref 1.9–3.7)
Glucose, Bld: 84 mg/dL (ref 65–99)
POTASSIUM: 3.8 mmol/L (ref 3.5–5.3)
SODIUM: 140 mmol/L (ref 135–146)
TOTAL PROTEIN: 6.8 g/dL (ref 6.1–8.1)

## 2018-12-29 LAB — CBC WITH DIFFERENTIAL/PLATELET
Absolute Monocytes: 1047 cells/uL — ABNORMAL HIGH (ref 200–950)
BASOS ABS: 64 {cells}/uL (ref 0–200)
BASOS PCT: 0.4 %
EOS ABS: 81 {cells}/uL (ref 15–500)
EOS PCT: 0.5 %
HCT: 43.4 % (ref 35.0–45.0)
Hemoglobin: 14.8 g/dL (ref 11.7–15.5)
Lymphs Abs: 1980 cells/uL (ref 850–3900)
MCH: 30.5 pg (ref 27.0–33.0)
MCHC: 34.1 g/dL (ref 32.0–36.0)
MCV: 89.3 fL (ref 80.0–100.0)
MONOS PCT: 6.5 %
MPV: 9.8 fL (ref 7.5–12.5)
Neutro Abs: 12928 cells/uL — ABNORMAL HIGH (ref 1500–7800)
Neutrophils Relative %: 80.3 %
PLATELETS: 314 10*3/uL (ref 140–400)
RBC: 4.86 10*6/uL (ref 3.80–5.10)
RDW: 13.3 % (ref 11.0–15.0)
TOTAL LYMPHOCYTE: 12.3 %
WBC: 16.1 10*3/uL — ABNORMAL HIGH (ref 3.8–10.8)

## 2018-12-31 NOTE — Progress Notes (Signed)
Elevated WBC noted.  Please asked patient if she had recent cortisone injection or prednisone or infection.

## 2019-01-01 ENCOUNTER — Other Ambulatory Visit: Payer: Self-pay | Admitting: Rheumatology

## 2019-01-01 NOTE — Telephone Encounter (Signed)
Last Visit: 09/14/18 Next visit: 03/15/19 Labs: 12/28/18 Elevated WBC noted.  PLQ eye exam:  12/07/18 WNL  Okay to refill per Dr. Estanislado Pandy

## 2019-01-14 ENCOUNTER — Encounter: Payer: Self-pay | Admitting: Rheumatology

## 2019-01-23 ENCOUNTER — Encounter: Payer: Self-pay | Admitting: Rheumatology

## 2019-01-30 ENCOUNTER — Ambulatory Visit: Payer: BC Managed Care – PPO | Admitting: Adult Health

## 2019-02-04 ENCOUNTER — Ambulatory Visit (INDEPENDENT_AMBULATORY_CARE_PROVIDER_SITE_OTHER): Payer: BC Managed Care – PPO | Admitting: Orthopaedic Surgery

## 2019-02-04 ENCOUNTER — Ambulatory Visit (INDEPENDENT_AMBULATORY_CARE_PROVIDER_SITE_OTHER): Payer: Self-pay

## 2019-02-04 ENCOUNTER — Encounter (INDEPENDENT_AMBULATORY_CARE_PROVIDER_SITE_OTHER): Payer: Self-pay | Admitting: Orthopaedic Surgery

## 2019-02-04 VITALS — BP 170/93 | HR 76 | Ht 63.0 in | Wt 198.0 lb

## 2019-02-04 DIAGNOSIS — M25571 Pain in right ankle and joints of right foot: Secondary | ICD-10-CM

## 2019-02-04 MED ORDER — DICLOFENAC SODIUM 1 % TD GEL: TRANSDERMAL | 3 refills | Status: DC

## 2019-02-04 NOTE — Progress Notes (Signed)
Office Visit Note   Patient: Misty Curtis           Date of Birth: 02/07/1961           MRN: [id number] Visit Date: 02/04/2019              Requested by: Isaias Sakai, [place], [place] [address] PCP: Isaias Sakai, DO   Assessment & Plan: Visit Diagnoses:  1. Pain in right ankle and joints of right foot     Plan: Insertional Achilles tendinitis right heel.  Voltaren gel.  Equalizer boot.  Office 6 to 8 weeks.  Long discussion regarding diagnosis and other treatment options including surgery  Follow-Up Instructions: Return if symptoms worsen or fail to improve.   Orders:  Orders Placed This Encounter  Procedures  . XR Ankle 2 Views Right   Meds ordered this encounter  Medications  . diclofenac sodium (VOLTAREN) 1 % GEL    Sig: 3 grams to 3 large joints up to 3 times daily    Dispense:  3 Tube    Refill:  3      Procedures: No procedures performed   Clinical Data: No additional findings.   Subjective: Chief Complaint  Patient presents with  . Right Ankle - Pain  Patient states that she strained her Achilles last July. She was out of town when this happened, but saw her PCP when she came back home. She said that it seemed to flare up more after Christmas. She has a knot at the posterior side of her heel. She said that the pain is affecting her gait. She is taking tylenol as needed. Has noted a "knot" in along the posterior aspect of her heel that she is not sure was present prior to injuring her heel last summer.  The more she is on her feet more she has a discomfort.  She is better when she wears a higher heeled shoe.  Pain is localized to the posterior aspect of her heel.  No numbness or tingling  HPI  Review of Systems   Objective: Vital Signs: BP (!) 170/93   Pulse 76   Ht 5\' 3"  (1.6 m)   Wt 198 lb (89.8 kg)   BMI 35.07 kg/m   Physical Exam Constitutional:      Appearance: She is well-developed.  Eyes:    Pupils: Pupils are equal, round, and reactive to light.  Pulmonary:     Effort: Pulmonary effort is normal.  Skin:    General: Skin is warm and dry.  Neurological:     Mental Status: She is alert and oriented to person, place, and time.  Psychiatric:        Behavior: Behavior normal.     Ortho Exam awake alert and oriented x3.  Comfortable sitting.  Examination of the right foot reveals a prominent mass at the Achilles tendon os calcis attachment.  No swelling.  No erythema.  Mass is tender.  Achilles tendon is intact.  No forefoot pain.  Neurovascular exam intact.  Specialty Comments:  No specialty comments available.  Imaging: Xr Ankle 2 Views Right  Result Date: 02/04/2019 Films of the right ankle obtained in 2 projections.  No acute changes.  Very small area of calcification at the Achilles tendon insertion on the os calcis.  Very mild prominence of the posterior aspect of the os calcis with some sclerosis but no large exostosis.  There is a plantar heel spur.  Midfoot intact.  Ankle intact.  Patient symptoms are consistent with insertional Achilles tendinitis    PMFS History: Patient Active Problem List   Diagnosis Date Noted  . Thrush 06/11/2018  . Acquired hypothyroidism 12/08/2016  . Autoimmune disease ([place]) 12/06/2016  . Other fatigue 12/06/2016  . Raynaud's disease without gangrene 12/06/2016  . High risk medication use 12/06/2016  . Allergic asthma, mild intermittent, uncomplicated 49/70/2637  . History of hypertension 12/06/2016  . Excessive daytime sleepiness 07/26/2016  . Snoring 07/26/2016  . Laryngitis 11/10/2012  . Postnasal drip 05/02/2012  . Acute sinusitis, unspecified 10/24/2011  . HYPERTENSION 12/14/2009  . BRONCHITIS 12/14/2009  . LUPUS 12/14/2009  . History of melanoma 12/14/2009  . Seasonal and perennial allergic rhinitis 12/14/2009   Past Medical History:  Diagnosis Date  . Allergic rhinitis    skin test 01-18-10  . Bronchitis   . Lupus  (systemic lupus erythematosus) (HCC)    joints and skin rash. Dr. Estanislado Pandy  . Melanoma ([place])   . Rhinosinusitis   . Skin cancer     Family History  Problem Relation Age of Onset  . Allergies Father   . Heart disease Father        bacterial endocarditis  . Cancer Maternal Grandmother   . Hypothyroidism Sister   . Hypothyroidism Sister   . Hashimoto's thyroiditis Sister   . Hepatitis Son     Past Surgical History:  Procedure Laterality Date  . ABDOMINAL HYSTERECTOMY    . KNEE ARTHROPLASTY    . skin cancer extraction     Social History   Occupational History  . 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 Smoking status: Never Smoker  . Smokeless tobacco: Never Used  Substance and Sexual Activity  . Alcohol use: Yes    Comment: 1 MONTHLY  . Drug use: No  . Sexual activity: Not on file

## 2019-02-18 ENCOUNTER — Other Ambulatory Visit: Payer: Self-pay | Admitting: Rheumatology

## 2019-02-19 MED ORDER — METHOTREXATE 2.5 MG PO TABS: 12.5000 mg | ORAL_TABLET | ORAL | 0 refills | Status: DC

## 2019-02-19 NOTE — Telephone Encounter (Signed)
Last Visit: 09/14/18 Next visit: 03/15/19 Labs: 12/28/18 Elevated WBC noted.   Okay to refill per Dr. Estanislado Pandy

## 2019-02-20 ENCOUNTER — Encounter: Payer: Self-pay | Admitting: Neurology

## 2019-02-20 ENCOUNTER — Ambulatory Visit: Payer: BC Managed Care – PPO | Admitting: Neurology

## 2019-02-20 VITALS — BP 158/86 | HR 73 | Ht 63.0 in | Wt 198.0 lb

## 2019-02-20 DIAGNOSIS — G4719 Other hypersomnia: Secondary | ICD-10-CM | POA: Diagnosis not present

## 2019-02-20 DIAGNOSIS — R5383 Other fatigue: Secondary | ICD-10-CM | POA: Diagnosis not present

## 2019-02-20 MED ORDER — MODAFINIL 200 MG PO TABS: 200.0000 mg | ORAL_TABLET | Freq: Every day | ORAL | 5 refills | Status: DC

## 2019-02-20 NOTE — Progress Notes (Signed)
PATIENT: Misty Curtis DOB: Jun 25, 1961  REASON FOR VISIT: follow up HISTORY FROM: patient  HISTORY OF PRESENT ILLNESS: Today 02/20/19 Misty Curtis is a 58 year old female with a history of idiopathic hypersomnia.  Misty Curtis is currently taking Provigil 200 mg daily however Misty Curtis is only taking half tablet.  Misty Curtis reports Misty Curtis is tolerating the medication well.  Misty Curtis will take it around 6:30 AM and Misty Curtis reports benefit until about 9:30 PM whenever Misty Curtis goes to bed.  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  Misty Curtis reports Misty Curtis is able to do her job well without significant fatigue.  Misty Curtis returns today requesting a refill on her Provigil.  Misty Curtis presents today for evaluation unaccompanied.  Misty Curtis denies any new problems or concerns.  Misty Curtis is wearing a boot to her right foot that is healing from a prior injury.  HISTORY 01/29/18 MM Misty Curtis is a 58 year old female with a history of idiopathic hypersomnia.  Misty Curtis returns today for follow-up.  Misty Curtis is currently taking Provigil 200 mg daily.  Misty Curtis reports that Misty Curtis is tolerating this well.  Misty Curtis states that Misty Curtis can only take half a tablet in the morning and again at lunchtime.  Misty Curtis denies falling asleep on the job.  Misty Curtis states occasionally Misty Curtis will have flareups where Misty Curtis is more sleepy than other times.  Misty Curtis also reports that her stress level has increased.  Misty Curtis states that Misty Curtis changed jobs and finished her doctorate and therefore her strength has improved which Misty Curtis thinks has improved her sleepiness as well.  Misty Curtis returns today for evaluation.   REVIEW OF SYSTEMS: Out of a complete 14 system review of symptoms, the patient complains only of the following symptoms, and all other reviewed systems are negative.  ALLERGIES: Allergies  Allergen Reactions  . Latex Other (See Comments)  . Neomycin-Bacitracin Zn-Polymyx     REACTION: rash  . Onion     Also allergic to peppers and hot peppers  . Strawberry Extract     HOME MEDICATIONS: Outpatient Medications Prior to Visit    Medication Sig Dispense Refill  . albuterol (PROVENTIL HFA;VENTOLIN HFA) 108 (90 Base) MCG/ACT inhaler Inhale 2 puffs into the lungs every 6 (six) hours as needed for wheezing or shortness of breath. 1 Inhaler 12  . Ascorbic Acid (VITAMIN C) 500 MG tablet Take 500 mg by mouth daily.      . B Complex Vitamins (VITAMIN B COMPLEX PO) Take by mouth.    Marland Kitchen BIOTENE DRY MOUTH (BIOTENE) LIQD Place 1 application onto teeth as needed.      . budesonide-formoterol (SYMBICORT) 160-4.5 MCG/ACT inhaler INHALE 2 PUFFS INTO THE LUNGS TWICE DAILY. RINSE MOUTH AFTER USE 10.2 g 12  . Cholecalciferol (VITAMIN D) 2000 units CAPS Take by mouth.    . clotrimazole-betamethasone (LOTRISONE) cream Apply topically 2 (two) times daily. 30 g prn  . diclofenac sodium (VOLTAREN) 1 % GEL 3 grams to 3 large joints up to 3 times daily 3 Tube 3  . Digestive Enzymes (DIGESTIVE ENZYME PO) Take by mouth.    . fluticasone (FLONASE) 50 MCG/ACT nasal spray SHAKE LIQUID AND USE 2 SPRAYS IN EACH NOSTRIL DAILY 16 g 5  . folic acid (FOLVITE) 1 MG tablet Take 2 tablets (2 mg total) by mouth daily. 180 tablet 4  . hydroxychloroquine (PLAQUENIL) 200 MG tablet TAKE 1 AND 1/2 TABLET BY MOUTH EVERY DAY 45 tablet 1  . lactase (LACTAID) 3000 UNITS tablet Take by mouth as needed.     Marland Kitchen  levothyroxine (SYNTHROID) 50 MCG tablet TK 1 T PO QAM FOR THYROID    . lisinopril (PRINIVIL,ZESTRIL) 5 MG tablet Take 5 mg by mouth daily.    Marland Kitchen loratadine (CLARITIN) 10 MG tablet Take 10 mg by mouth daily.      . methotrexate (RHEUMATREX) 2.5 MG tablet Take 5 tablets (12.5 mg total) by mouth once a week. Caution:Chemotherapy. Protect from light. 60 tablet 0  . montelukast (SINGULAIR) 10 MG tablet 1 daily 30 tablet 12  . mupirocin ointment (BACTROBAN) 2 % Apply twice daily as directed 22 g 12  . nabumetone (RELAFEN) 750 MG tablet Take 750 mg by mouth 2 (two) times daily as needed.     . NON FORMULARY Doterra Essential Oils:  Eucalyptus, [place], [place]rass,  [place], [place], [employer]    . ondansetron (ZOFRAN) 4 MG tablet TAKE 1 TABLET BY MOUTH EVERY 6 HOURS AS NEEDED FOR NAUSEA 30 tablet 0  . phenylephrine (SUDAFED PE) 10 MG TABS tablet Take 10 mg by mouth every 4 (four) hours as needed.    Marland Kitchen Spacer/Aero-Holding Chambers (AEROCHAMBER MV) inhaler Use as instructed 1 each 0  . Specialty Vitamins Products (CENTRUM PERFORMANCE) TABS Take 1 tablet by mouth daily.      . modafinil (PROVIGIL) 200 MG tablet TAKE 1 TABLET BY MOUTH EVERY DAY 90 tablet 0   No facility-administered medications prior to visit.     PAST MEDICAL HISTORY: Past Medical History:  Diagnosis Date  . Allergic rhinitis    skin test 01-18-10  . Bronchitis   . Lupus (systemic lupus erythematosus) (HCC)    joints and skin rash. Dr. Estanislado Pandy  . Melanoma ([place])   . Rhinosinusitis   . Skin cancer     PAST SURGICAL HISTORY: Past Surgical History:  Procedure Laterality Date  . ABDOMINAL HYSTERECTOMY    . KNEE ARTHROPLASTY    . skin cancer extraction      FAMILY HISTORY: Family History  Problem Relation Age of Onset  . Allergies Father   . Heart disease Father        bacterial endocarditis  . Cancer Maternal Grandmother   . Hypothyroidism Sister   . Hypothyroidism Sister   . Hashimoto's thyroiditis Sister   . Hepatitis Son     SOCIAL HISTORY: Social History   Socioeconomic History  . Marital status: Married    Spouse name: Not on file  . Number of children: 3  . Years of education: Not on file  . Highest education level: Not on file  Occupational History  . Occupation: special ed teacher [employer]  . Financial resource strain: Not on file  . Food insecurity:    Worry: Not on file    Inability: Not on file  . Transportation needs:    Medical: Not on file    Non-medical: Not on file  Tobacco Use  . Smoking status: Never Smoker  . Smokeless tobacco: Never Used  Substance and Sexual Activity  . Alcohol use: Yes    Comment: 1  MONTHLY  . Drug use: No  . Sexual activity: Not on file  Lifestyle  . Physical activity:    Days per week: Not on file    Minutes per session: Not on file  . Stress: Not on file  Relationships  . Social connections:    Talks on phone: Not on file    Gets together: Not on file    Attends religious service: Not on file    Active  member of club or organization: Not on file    Attends meetings of clubs or organizations: Not on file    Relationship status: Not on file  . Intimate partner violence:    Fear of current or ex partner: Not on file    Emotionally abused: Not on file    Physically abused: Not on file    Forced sexual activity: Not on file  Other Topics Concern  . Not on file  Social History Narrative  . Not on file      PHYSICAL EXAM  Vitals:   02/20/19 1326  BP: (!) 158/86  Pulse: 73  Weight: 198 lb (89.8 kg)  Height: 5\' 3"  (1.6 m)   Body mass index is 35.07 kg/m.  Generalized: Well developed, in no acute distress   Neurological examination  Mentation: Alert oriented to time, place, history taking. Follows all commands speech and language fluent Cranial nerve II-XII: Pupils were equal round reactive to light. Extraocular movements were full, visual field were full on confrontational test. Facial sensation and strength were normal. Uvula tongue midline. Head turning and shoulder shrug  were normal and symmetric. Motor: The motor testing reveals 5 over 5 strength of all 4 extremities. Good symmetric motor tone is noted throughout.  Sensory: Sensory testing is intact to soft touch on all 4 extremities. No evidence of extinction is noted.  Coordination: Cerebellar testing reveals good finger-nose-finger and heel-to-shin bilaterally.  Gait and station: Gait is mildly impaired, wearing a right cam walker.   Reflexes: Deep tendon reflexes are symmetric and normal bilaterally.   DIAGNOSTIC DATA (LABS, IMAGING, TESTING) - I reviewed patient records, labs, notes,  testing and imaging myself where available.  Lab Results  Component Value Date   WBC 16.1 (H) 12/28/2018   HGB 14.8 12/28/2018   HCT 43.4 12/28/2018   MCV 89.3 12/28/2018   PLT 314 12/28/2018      Component Value Date/Time   NA 140 12/28/2018 1545   K 3.8 12/28/2018 1545   CL 100 12/28/2018 1545   CO2 30 12/28/2018 1545   GLUCOSE 84 12/28/2018 1545   BUN 14 12/28/2018 1545   CREATININE 0.93 12/28/2018 1545   CALCIUM 9.4 12/28/2018 1545   PROT 6.8 12/28/2018 1545   ALBUMIN 4.1 06/02/2017 0945   AST 29 12/28/2018 1545   ALT 21 12/28/2018 1545   ALKPHOS 82 06/02/2017 0945   BILITOT 0.5 12/28/2018 1545   GFRNONAA 67 12/28/2018 1545   GFRAA 78 12/28/2018 1545   No results found for: CHOL, HDL, LDLCALC, LDLDIRECT, TRIG, CHOLHDL No results found for: HGBA1C No results found for: VITAMINB12 No results found for: TSH    ASSESSMENT AND PLAN 58 y.o. year old female  has a past medical history of Allergic rhinitis, Bronchitis, Lupus (systemic lupus erythematosus) ([place]), Melanoma ([place]), Rhinosinusitis, and Skin cancer. here with:  1.  Idiopathic hypersomnia  Overall the patient is doing quite well.  Misty Curtis will continue taking Provigil 200 mg daily, Misty Curtis has only been taking half tablet.  Misty Curtis is doing quite well on the medication. A refill was sent to her pharmacy.  Misty Curtis will follow-up in 1 year or sooner if needed.   I spent 15 minutes with the patient. 50% of this time was spent discussing her plan of care.   Butler Denmark, AGNP-C, DNP 02/20/2019, 1:53 PM [facility] [address] [place], [place] [address] [phone]

## 2019-03-02 ENCOUNTER — Encounter (INDEPENDENT_AMBULATORY_CARE_PROVIDER_SITE_OTHER): Payer: Self-pay | Admitting: Orthopaedic Surgery

## 2019-03-04 ENCOUNTER — Other Ambulatory Visit: Payer: Self-pay | Admitting: *Deleted

## 2019-03-04 MED ORDER — HYDROXYCHLOROQUINE SULFATE 200 MG PO TABS: 300.0000 mg | ORAL_TABLET | Freq: Every day | ORAL | 1 refills | Status: DC

## 2019-03-04 NOTE — Telephone Encounter (Signed)
Refill request received via fax  Last Visit: 09/14/18 Next visit: 03/15/19 Labs:1/10/20Elevated WBC noted.  PLQ eye exam:  12/07/18 WNL  Okay to refill per Dr. Estanislado Pandy

## 2019-03-05 ENCOUNTER — Other Ambulatory Visit (INDEPENDENT_AMBULATORY_CARE_PROVIDER_SITE_OTHER): Payer: Self-pay | Admitting: Orthopaedic Surgery

## 2019-03-05 DIAGNOSIS — M25571 Pain in right ankle and joints of right foot: Secondary | ICD-10-CM

## 2019-03-08 ENCOUNTER — Encounter (INDEPENDENT_AMBULATORY_CARE_PROVIDER_SITE_OTHER): Payer: Self-pay | Admitting: Orthopaedic Surgery

## 2019-03-10 ENCOUNTER — Encounter (INDEPENDENT_AMBULATORY_CARE_PROVIDER_SITE_OTHER): Payer: Self-pay | Admitting: Orthopaedic Surgery

## 2019-03-13 ENCOUNTER — Other Ambulatory Visit: Payer: Self-pay

## 2019-03-13 ENCOUNTER — Ambulatory Visit
Admission: RE | Admit: 2019-03-13 | Discharge: 2019-03-13 | Disposition: A | Discharge status: Home / Self Care | Payer: BC Managed Care – PPO | Source: Ambulatory Visit | Attending: Orthopaedic Surgery | Admitting: Orthopaedic Surgery

## 2019-03-13 DIAGNOSIS — M25571 Pain in right ankle and joints of right foot: Secondary | ICD-10-CM

## 2019-03-14 ENCOUNTER — Other Ambulatory Visit: Payer: Self-pay

## 2019-03-15 ENCOUNTER — Other Ambulatory Visit: Payer: Self-pay

## 2019-03-15 ENCOUNTER — Encounter: Payer: Self-pay | Admitting: Physician Assistant

## 2019-03-15 ENCOUNTER — Ambulatory Visit: Payer: BC Managed Care – PPO | Admitting: Physician Assistant

## 2019-03-15 VITALS — BP 158/91 | HR 74 | Resp 14 | Ht 63.0 in | Wt 199.0 lb

## 2019-03-15 DIAGNOSIS — Z8709 Personal history of other diseases of the respiratory system: Secondary | ICD-10-CM

## 2019-03-15 DIAGNOSIS — Z87898 Personal history of other specified conditions: Secondary | ICD-10-CM

## 2019-03-15 DIAGNOSIS — Z79899 Other long term (current) drug therapy: Secondary | ICD-10-CM

## 2019-03-15 DIAGNOSIS — Z85820 Personal history of malignant melanoma of skin: Secondary | ICD-10-CM

## 2019-03-15 DIAGNOSIS — M359 Systemic involvement of connective tissue, unspecified: Secondary | ICD-10-CM | POA: Diagnosis not present

## 2019-03-15 DIAGNOSIS — Z8639 Personal history of other endocrine, nutritional and metabolic disease: Secondary | ICD-10-CM

## 2019-03-15 DIAGNOSIS — Z8679 Personal history of other diseases of the circulatory system: Secondary | ICD-10-CM

## 2019-03-15 DIAGNOSIS — Z8669 Personal history of other diseases of the nervous system and sense organs: Secondary | ICD-10-CM

## 2019-03-15 DIAGNOSIS — I7300 Raynaud's syndrome without gangrene: Secondary | ICD-10-CM

## 2019-03-15 DIAGNOSIS — G5601 Carpal tunnel syndrome, right upper limb: Secondary | ICD-10-CM | POA: Diagnosis not present

## 2019-03-15 DIAGNOSIS — M7661 Achilles tendinitis, right leg: Secondary | ICD-10-CM

## 2019-03-15 MED ORDER — FOLIC ACID 1 MG PO TABS: 2.0000 mg | ORAL_TABLET | Freq: Every day | ORAL | 4 refills | Status: AC

## 2019-03-15 MED ORDER — ONDANSETRON HCL 4 MG PO TABS: 4.0000 mg | ORAL_TABLET | Freq: Four times a day (QID) | ORAL | 0 refills | Status: DC | PRN

## 2019-03-15 NOTE — Progress Notes (Signed)
CBC WNL.  Urine is cloudy but rest of UA is WNL.

## 2019-03-15 NOTE — Progress Notes (Signed)
Office Visit Note  Patient: Misty Curtis             Date of Birth: 1961/08/07           MRN: [id number]             PCP: Isaias Sakai, DO Referring: Alonna Buckler* Visit Date: 03/15/2019 Occupation: @GUAROCC @  Subjective:  Right wrist pain   History of Present Illness: Misty Curtis is a 58 y.o. female with history of autoimmune disease and Raynaud's disease.  She is taking PLQ 200 mg 1 tablet in the morning and 1/2 tablet by mouth in evening, MTX 5 tablets by mouth once weekly, and folic acid 1 mg po daily.  She denies missing any doses recently.  She has not had any recent infections.  She reports she has not had any recent rashes or hair loss.  She continues to have recurrent oral ulcerations but no nasal ulcerations. She has intermittent symptoms of Raynaud's but no ulcerations.  She has chronic sicca symptoms.  She denies any palpitations or shortness of breath.  She denies any swollen lymph nodes or fevers.  Her level of fatigue has been stable. She continues to have pain in both hands and some stiffness.  She has no joint swelling.  She has intermittent pain and swelling in the right wrist joint.  She has right carpal tunnel syndrome and wears a night splint.   She states she has a right achilles tendonitis and has a MRI and has been evaluated by Dr. Durward Fortes.  He is not doing elective surgeries at this time due to the coronavirus pandemic.  She is currently wearing a boot but she will be transitioned to a shoe with a heel.   Activities of Daily Living:  Patient reports morning stiffness for 20-30 minutes.   Patient Reports nocturnal pain.  Difficulty dressing/grooming: Denies Difficulty climbing stairs: Reports Difficulty getting out of chair: Denies Difficulty using hands for taps, buttons, cutlery, and/or writing: Denies  Review of Systems  Constitutional: Positive for fatigue.  HENT: Positive for mouth dryness. Negative for mouth sores and nose dryness.     Eyes: Positive for dryness. Negative for pain and visual disturbance.  Respiratory: Negative for cough, hemoptysis, shortness of breath and difficulty breathing.   Cardiovascular: Negative for chest pain, palpitations, hypertension and swelling in legs/feet.  Gastrointestinal: Positive for diarrhea. Negative for blood in stool and constipation.  Endocrine: Negative for increased urination.  Genitourinary: Negative for painful urination.  Musculoskeletal: Positive for arthralgias, joint pain, joint swelling and morning stiffness. Negative for myalgias, muscle weakness, muscle tenderness and myalgias.  Skin: Positive for color change. Negative for pallor, rash, hair loss, nodules/bumps, skin tightness, ulcers and sensitivity to sunlight.  Allergic/Immunologic: Negative for susceptible to infections.  Neurological: Negative for dizziness, numbness, headaches and weakness.  Hematological: Negative for swollen glands.  Psychiatric/Behavioral: Negative for depressed mood and sleep disturbance. The patient is not nervous/anxious.     PMFS History:  Patient Active Problem List   Diagnosis Date Noted   Thrush 06/11/2018   Acquired hypothyroidism 12/08/2016   Autoimmune disease ([place]) 12/06/2016   Other fatigue 12/06/2016   Raynaud's disease without gangrene 12/06/2016   High risk medication use 12/06/2016   Allergic asthma, mild intermittent, uncomplicated 44/96/7591   History of hypertension 12/06/2016   Excessive daytime sleepiness 07/26/2016   Snoring 07/26/2016   Laryngitis 11/10/2012   Postnasal drip 05/02/2012   Acute sinusitis, unspecified 10/24/2011   HYPERTENSION 12/14/2009   BRONCHITIS  12/14/2009   LUPUS 12/14/2009   History of melanoma 12/14/2009   Seasonal and perennial allergic rhinitis 12/14/2009    Past Medical History:  Diagnosis Date   Allergic rhinitis    skin test 01-18-10   Bronchitis    Lupus (systemic lupus erythematosus) (HCC)    joints and  skin rash. Dr. Estanislado Pandy   Melanoma [facility])    Rhinosinusitis    Skin cancer     Family History  Problem Relation Age of Onset   Allergies Father    Heart disease Father        bacterial endocarditis   Cancer Maternal Grandmother    Hypothyroidism Sister    Hypothyroidism Sister    Hashimoto's thyroiditis Sister    Hepatitis Son    Past Surgical History:  Procedure Laterality Date   ABDOMINAL HYSTERECTOMY     KNEE ARTHROPLASTY     skin cancer extraction     Social History   Social History Narrative   Not on file   Immunization History  Administered Date(s) Administered   Influenza Split 09/19/2011, 09/12/2012, 09/02/2013   Influenza-Unspecified 10/19/2014   Pneumococcal Polysaccharide-23 08/19/2010     Objective: Vital Signs: BP (!) 158/91 (BP Location: Left Arm, Patient Position: Sitting, Cuff Size: Normal)    Pulse 74    Resp 14    Ht 5\' 3"  (1.6 m)    Wt 199 lb (90.3 kg)    BMI 35.25 kg/m    Physical Exam Vitals signs and nursing note reviewed.  Constitutional:      Appearance: She is well-developed.  HENT:     Head: Normocephalic and atraumatic.     Nose:     Comments: No nasal ulcerations     Mouth/Throat:     Comments: No oral ulcerations  Eyes:     Conjunctiva/sclera: Conjunctivae normal.  Neck:     Musculoskeletal: Normal range of motion.  Cardiovascular:     Rate and Rhythm: Normal rate and regular rhythm.     Heart sounds: Normal heart sounds.  Pulmonary:     Effort: Pulmonary effort is normal.     Breath sounds: Normal breath sounds.  Abdominal:     General: Bowel sounds are normal.     Palpations: Abdomen is soft.  Lymphadenopathy:     Cervical: No cervical adenopathy.  Skin:    General: Skin is warm and dry.     Capillary Refill: Capillary refill takes less than 2 seconds.     Comments: No malar rash noted. No digital ulcerations or signs of gangrene.  Neurological:     Mental Status: She is alert and oriented to person,  place, and time.  Psychiatric:        Behavior: Behavior normal.      Musculoskeletal Exam: C-spine, thoracic spine, and lumbar spine good ROM.  No midline spinal tenderness.  No SI joint tenderness.  Shoulder joints, elbow joints, wrist joints, MCPs, PIPs, and DIPs good ROM with no synovitis.  Complete fist formation.  Hip joints, knee joints, ankle joints, MTPs, PIPs, and DIPs good ROM with no synovitis.  No warmth or effusion of knee joints.  No tenderness or swelling of ankle joints.  No tenderness over trochanteric bursa bilaterally.    CDAI Exam: CDAI Score: Not documented Patient Global Assessment: Not documented; Provider Global Assessment: Not documented Swollen: Not documented; Tender: Not documented Joint Exam   Not documented   There is currently no information documented on the homunculus. Go to the Rheumatology activity  and complete the homunculus joint exam.  Investigation: No additional findings.  Imaging: Mr Ankle Right W/o Contrast  Result Date: 03/13/2019 CLINICAL DATA:  The patient suffered a twisting injury of the right ankle 06/21/2018. Continued diffuse pain and weakness. EXAM: MRI OF THE RIGHT ANKLE WITHOUT CONTRAST TECHNIQUE: Multiplanar, multisequence MR imaging of the ankle was performed. No intravenous contrast was administered. COMPARISON:  None. FINDINGS: TENDONS Peroneal: Intact. Posteromedial: Intact. Anterior: Intact. Achilles: Intact. Mild intrasubstance increased T2 signal is seen in the distal tendon. There is some fluid in the retrocalcaneal bursa. Spurring off the dorsal process of the calcaneus is noted. Plantar Fascia: Normal. LIGAMENTS Lateral: Intact. Medial: Intact. CARTILAGE Ankle Joint: Normal. Subtalar Joints/Sinus Tarsi: Normal. Bones: No fracture or focal lesion.  No evidence of stress change. Other: None. IMPRESSION: Mild appearing Achilles tendinopathy without tear. Associated fluid in the retrocalcaneal bursa is consistent with bursitis.  There is some spurring off the dorsal process of the calcaneus. The exam is otherwise negative. Electronically Signed   By: Inge Rise M.D.   On: 03/13/2019 14:32    Recent Labs: Lab Results  Component Value Date   WBC 16.1 (H) 12/28/2018   HGB 14.8 12/28/2018   PLT 314 12/28/2018   NA 140 12/28/2018   K 3.8 12/28/2018   CL 100 12/28/2018   CO2 30 12/28/2018   GLUCOSE 84 12/28/2018   BUN 14 12/28/2018   CREATININE 0.93 12/28/2018   BILITOT 0.5 12/28/2018   ALKPHOS 82 06/02/2017   AST 29 12/28/2018   ALT 21 12/28/2018   PROT 6.8 12/28/2018   ALBUMIN 4.1 06/02/2017   CALCIUM 9.4 12/28/2018   GFRAA 78 12/28/2018    Speciality Comments: PLQ eye exam:  12/07/18 WNL at Dr Myna Hidalgo. Hulan Saas, OD Follow up in 1 year   Procedures:  No procedures performed Allergies: Latex; Neomycin-bacitracin zn-polymyx; Onion; and Strawberry extract   Assessment / Plan:     Visit Diagnoses: Autoimmune disease ([place]) -  History of positive ANA, DS DNA, Raynauds, fatigue, oral ulcers, nasal ulcers, arthralgias,sicca: She has not had any signs or symptoms of a flare.  She is clinically doing well on Plaquenil 200 mg 1 tablet in the morning and half tablet in the evening, methotrexate 5 tablets by mouth once weekly, folic acid 1 mg by mouth daily.  She continues to take Zofran as needed for nausea following the dose of methotrexate.  Refills of Zofran and folic acid were sent to the pharmacy today.  She continues to have chronic sicca symptoms but no parotid swelling or tenderness was noted on exam.  She has intermittent symptoms of Raynaud's but no digital stations or signs of gangrene were noted.  She has recurrent oral ulcerations but none were noted on exam today.  She has not had any recent rashes.  No Maller rash noted.  No cervical lymphadenopathy noted.  She has not had any recent fevers or worsening fatigue.  She denied any palpitations or shortness of breath.  Lungs were clear to auscultation on exam.   She has no synovitis on exam.  She continues have intermittent discomfort and stiffness in both hands.  We will check autoimmune lab work today.  She will continue on the current treatment regimen.  She is advised to notify us if she develops any signs or symptoms of a flare.  She will follow-up in the office in 5 months.- Plan: CBC with Differential/Platelet, COMPLETE METABOLIC PANEL WITH GFR, Urinalysis, Routine w reflex microscopic, Anti-DNA antibody, double-stranded, C3  and C4, Sedimentation rate  Raynaud's disease without gangrene: She has intermittent symptoms of Raynaud's.  She has no digital ulcerations or signs of gangrene.  She was encouraged to wear thick socks and gloves as well as keeping her core body temperature warm.  She will notify us if she develops any signs of ulcerations or gangrene.  High risk medication use - PLQ 200 mg 1 tablet in the morning and 1/2 tablet in the evening, MTX 5 tablets by mouth once a week, and folic acid 1 mg daily.  CBC and CMP will be drawn today to monitor for drug toxicity.- Plan: CBC with Differential/Platelet, COMPLETE METABOLIC PANEL WITH GFR  Right carpal tunnel syndrome: She has intermittent symptoms of right carpal tunnel syndrome.  She is been evaluated by hand surgeon in the past.  She is wearing a night splint and wears a wrist splint during the day when she is typing.  She does not want to proceed with surgery at this time.  Achilles tendinitis of right lower extremity: She had an MRI on 03/13/2019 that revealed mild appearing Achilles tendinopathy without tear.  Associated fluid in the retrocalcaneal bursa was consistent with bursitis.  She was evaluated by Dr. Durward Fortes who recommended using voltaren gel and wearing an equalizer boot.    Other medical conditions are listed as follows:   History of melanoma  History of hypothyroidism  History of fatigue  History of sleep apnea  History of asthma  History of hypertension    Orders: Orders Placed This Encounter  Procedures   CBC with Differential/Platelet   COMPLETE METABOLIC PANEL WITH GFR   Urinalysis, Routine w reflex microscopic   Anti-DNA antibody, double-stranded   C3 and C4   Sedimentation rate   Meds ordered this encounter  Medications   folic acid (FOLVITE) 1 MG tablet    Sig: Take 2 tablets (2 mg total) by mouth daily.    Dispense:  180 tablet    Refill:  4   ondansetron (ZOFRAN) 4 MG tablet    Sig: Take 1 tablet (4 mg total) by mouth every 6 (six) hours as needed. for nausea    Dispense:  30 tablet    Refill:  0    Face-to-face time spent with patient was 30 minutes. Greater than 50% of time was spent in counseling and coordination of care.  Follow-Up Instructions: Return in about 5 months (around 08/15/2019) for Autoimmune Disease.   Ofilia Neas, PA-C  Note - This record has been created using Dragon software.  Chart creation errors have been sought, but may not always  have been located. Such creation errors do not reflect on  the standard of medical care.

## 2019-03-18 ENCOUNTER — Telehealth: Payer: Self-pay | Admitting: Rheumatology

## 2019-03-18 LAB — URINALYSIS, ROUTINE W REFLEX MICROSCOPIC
Bilirubin Urine: NEGATIVE
Glucose, UA: NEGATIVE
HGB URINE DIPSTICK: NEGATIVE
Ketones, ur: NEGATIVE
Leukocytes,Ua: NEGATIVE
Nitrite: NEGATIVE
Protein, ur: NEGATIVE
Specific Gravity, Urine: 1.022 (ref 1.001–1.03)
pH: 8 (ref 5.0–8.0)

## 2019-03-18 LAB — COMPLETE METABOLIC PANEL WITH GFR
AG Ratio: 1.5 (calc) (ref 1.0–2.5)
ALT: 18 U/L (ref 6–29)
AST: 28 U/L (ref 10–35)
Albumin: 4 g/dL (ref 3.6–5.1)
Alkaline phosphatase (APISO): 84 U/L (ref 37–153)
BUN: 18 mg/dL (ref 7–25)
CALCIUM: 9.4 mg/dL (ref 8.6–10.4)
CO2: 30 mmol/L (ref 20–32)
Chloride: 104 mmol/L (ref 98–110)
Creat: 1 mg/dL (ref 0.50–1.05)
GFR, EST AFRICAN AMERICAN: 71 mL/min/{1.73_m2} (ref >=60)
GFR, Est Non African American: 62 mL/min/{1.73_m2} (ref >=60)
Globulin: 2.6 g/dL (calc) (ref 1.9–3.7)
Glucose, Bld: 127 mg/dL — ABNORMAL HIGH (ref 65–99)
Potassium: 4 mmol/L (ref 3.5–5.3)
Sodium: 142 mmol/L (ref 135–146)
Total Bilirubin: 0.3 mg/dL (ref 0.2–1.2)
Total Protein: 6.6 g/dL (ref 6.1–8.1)

## 2019-03-18 LAB — CBC WITH DIFFERENTIAL/PLATELET
Absolute Monocytes: 549 cells/uL (ref 200–950)
Basophils Absolute: 41 cells/uL (ref 0–200)
Basophils Relative: 0.5 %
Eosinophils Absolute: 172 cells/uL (ref 15–500)
Eosinophils Relative: 2.1 %
HCT: 44.2 % (ref 35.0–45.0)
Hemoglobin: 15.1 g/dL (ref 11.7–15.5)
Lymphs Abs: 2247 cells/uL (ref 850–3900)
MCH: 30.6 pg (ref 27.0–33.0)
MCHC: 34.2 g/dL (ref 32.0–36.0)
MCV: 89.7 fL (ref 80.0–100.0)
MPV: 9.9 fL (ref 7.5–12.5)
Monocytes Relative: 6.7 %
Neutro Abs: 5191 cells/uL (ref 1500–7800)
Neutrophils Relative %: 63.3 %
Platelets: 302 10*3/uL (ref 140–400)
RBC: 4.93 10*6/uL (ref 3.80–5.10)
RDW: 13.7 % (ref 11.0–15.0)
Total Lymphocyte: 27.4 %
WBC: 8.2 10*3/uL (ref 3.8–10.8)

## 2019-03-18 LAB — C3 AND C4
C3 Complement: 151 mg/dL (ref 83–193)
C4 Complement: 26 mg/dL (ref 15–57)

## 2019-03-18 LAB — SEDIMENTATION RATE: Sed Rate: 2 mm/h (ref 0–30)

## 2019-03-18 LAB — ANTI-DNA ANTIBODY, DOUBLE-STRANDED: ds DNA Ab: 1 IU/mL

## 2019-03-18 NOTE — Telephone Encounter (Signed)
Patient left a voicemail stating she was returning your call regarding labwork results.

## 2019-03-18 NOTE — Progress Notes (Signed)
DsDNA is <1.

## 2019-03-18 NOTE — Telephone Encounter (Signed)
Patient advised CBC WNL.  Urine is cloudy but rest of UA is WNL. Glucose is elevated-127.  Rest of CMP WNL. Complements and sed rate are WNL. DsDNA is 1. Patient verbalized understanding.

## 2019-03-18 NOTE — Progress Notes (Signed)
Glucose is elevated-127.  Rest of CMP WNL. Complements and sed rate are WNL.

## 2019-04-24 ENCOUNTER — Other Ambulatory Visit: Payer: Self-pay | Admitting: Rheumatology

## 2019-04-24 DIAGNOSIS — M359 Systemic involvement of connective tissue, unspecified: Secondary | ICD-10-CM

## 2019-04-24 NOTE — Telephone Encounter (Signed)
Last Visit: 03/15/2019 Next Visit: message sent to the front desk to schedule.  Labs: 03/15/2019 Glucose is elevated-127. Rest of CMP WNL.  Eye exam: 12/07/2018  Okay to refill per Dr. Estanislado Pandy.

## 2019-04-25 ENCOUNTER — Encounter (INDEPENDENT_AMBULATORY_CARE_PROVIDER_SITE_OTHER): Payer: Self-pay | Admitting: Orthopaedic Surgery

## 2019-04-26 NOTE — Telephone Encounter (Signed)
Have not seen in almost 3 months... need to see in office before scheduling surgery

## 2019-05-02 ENCOUNTER — Encounter: Payer: Self-pay | Admitting: Orthopaedic Surgery

## 2019-05-02 ENCOUNTER — Ambulatory Visit: Payer: BC Managed Care – PPO | Admitting: Orthopaedic Surgery

## 2019-05-02 ENCOUNTER — Other Ambulatory Visit: Payer: Self-pay

## 2019-05-02 VITALS — BP 176/90 | HR 77 | Ht 63.0 in | Wt 199.0 lb

## 2019-05-02 DIAGNOSIS — M79671 Pain in right foot: Secondary | ICD-10-CM | POA: Diagnosis not present

## 2019-05-02 DIAGNOSIS — G8929 Other chronic pain: Secondary | ICD-10-CM | POA: Diagnosis not present

## 2019-05-02 NOTE — Progress Notes (Signed)
Office Visit Note   Patient: Misty Curtis           Date of Birth: 1961-11-26           MRN: [id number] Visit Date: 05/02/2019              Requested by: Isaias Sakai, [place] [place], [place] [address] PCP: Isaias Sakai, DO   Assessment & Plan: Visit Diagnoses:  1. Heel pain, chronic, right     Plan: Chronic posterior right heel pain with evidence of insertional Achilles tendinitis.  Has been unresponsive to conservative treatment since onset in July 2019.  Long discussion with Mrs. Nickolson regarding treatment options and she like to proceed with surgery.  This would include exploration of the distal tendon with excision of the pump bump and retrocalcaneal bursa as well as Haglund's deformity.  I discussed the outpatient nature of the anesthesia and the immobilization for up to 6 weeks.  Answered all questions.  MRI scan from July 2019 was reviewed with Mrs. Kleine  Follow-Up Instructions: No follow-ups on file.   Orders:  No orders of the defined types were placed in this encounter.  No orders of the defined types were placed in this encounter.     Procedures: No procedures performed   Clinical Data: No additional findings.   Subjective: Chief Complaint  Patient presents with  . Right Ankle - Follow-up  Patient presents today for a 12month follow up on her right Achilles tendonitis. She wants to discuss surgery. She is using Voltaren gel, which helps some.  Has history of rheumatoid arthritis and taking Plaquenil.  HPI  Review of Systems   Objective: Vital Signs: BP (!) 176/90   Pulse 77   Ht 5\' 3"  (1.6 m)   Wt 199 lb (90.3 kg)   BMI 35.25 kg/m   Physical Exam Constitutional:      Appearance: She is well-developed.  Eyes:     Pupils: Pupils are equal, round, and reactive to light.  Pulmonary:     Effort: Pulmonary effort is normal.  Skin:    General: Skin is warm and dry.  Neurological:     Mental Status: She is alert  and oriented to person, place, and time.  Psychiatric:        Behavior: Behavior normal.     Ortho Exam awake alert and oriented x3.  Comfortable sitting.  Examination of right foot and heel demonstrates prominence of the posterior os calcis consistent with a pump bump.  Skin was normal and not red or ecchymotic.  There was local tenderness.  Achilles was intact.  Some pain both medial and lateral in the peri insertional region.  Neurologically intact.  Specialty Comments:  No specialty comments available.  Imaging: No results found.   PMFS History: Patient Active Problem List   Diagnosis Date Noted  . Heel pain, chronic, right 05/02/2019  . Thrush 06/11/2018  . Acquired hypothyroidism 12/08/2016  . Autoimmune disease ([place]) 12/06/2016  . Other fatigue 12/06/2016  . Raynaud's disease without gangrene 12/06/2016  . High risk medication use 12/06/2016  . Allergic asthma, mild intermittent, uncomplicated 67/20/9470  . History of hypertension 12/06/2016  . Excessive daytime sleepiness 07/26/2016  . Snoring 07/26/2016  . Laryngitis 11/10/2012  . Postnasal drip 05/02/2012  . Acute sinusitis, unspecified 10/24/2011  . HYPERTENSION 12/14/2009  . BRONCHITIS 12/14/2009  . LUPUS 12/14/2009  . History of melanoma 12/14/2009  . Seasonal and perennial allergic rhinitis 12/14/2009  Past Medical History:  Diagnosis Date  . Allergic rhinitis    skin test 01-18-10  . Bronchitis   . Lupus (systemic lupus erythematosus) (HCC)    joints and skin rash. Dr. Estanislado Pandy  . Melanoma ([place])   . Rhinosinusitis   . Skin cancer     Family History  Problem Relation Age of Onset  . Allergies Father   . Heart disease Father        bacterial endocarditis  . Cancer Maternal Grandmother   . Hypothyroidism Sister   . Hypothyroidism Sister   . Hashimoto's thyroiditis Sister   . Hepatitis Son     Past Surgical History:  Procedure Laterality Date  . ABDOMINAL HYSTERECTOMY    . KNEE ARTHROPLASTY     . skin cancer extraction     Social History   Occupational History  . 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 Smoking status: Never Smoker  . Smokeless tobacco: Never Used  Substance and Sexual Activity  . Alcohol use: Yes    Comment: 1 MONTHLY  . Drug use: No  . Sexual activity: Not on file

## 2019-05-06 ENCOUNTER — Encounter: Payer: Self-pay | Admitting: Orthopaedic Surgery

## 2019-05-16 ENCOUNTER — Other Ambulatory Visit: Payer: Self-pay | Admitting: Orthopedic Surgery

## 2019-05-16 DIAGNOSIS — M7661 Achilles tendinitis, right leg: Secondary | ICD-10-CM

## 2019-05-16 DIAGNOSIS — M71571 Other bursitis, not elsewhere classified, right ankle and foot: Secondary | ICD-10-CM | POA: Diagnosis not present

## 2019-05-16 MED ORDER — OXYCODONE-ACETAMINOPHEN 5-325 MG PO TABS: 1.0000 | ORAL_TABLET | ORAL | 0 refills | Status: DC | PRN

## 2019-05-23 ENCOUNTER — Inpatient Hospital Stay: Payer: BC Managed Care – PPO | Admitting: Orthopaedic Surgery

## 2019-05-23 ENCOUNTER — Ambulatory Visit (INDEPENDENT_AMBULATORY_CARE_PROVIDER_SITE_OTHER): Payer: BC Managed Care – PPO | Admitting: Orthopaedic Surgery

## 2019-05-23 ENCOUNTER — Other Ambulatory Visit: Payer: Self-pay

## 2019-05-23 ENCOUNTER — Encounter: Payer: Self-pay | Admitting: Orthopaedic Surgery

## 2019-05-23 VITALS — BP 161/87 | HR 89 | Ht 63.0 in | Wt 199.0 lb

## 2019-05-23 DIAGNOSIS — M79671 Pain in right foot: Secondary | ICD-10-CM

## 2019-05-23 NOTE — Progress Notes (Signed)
Office Visit Note   Patient: Misty Curtis           Date of Birth: 1961/10/02           MRN: [id number] Visit Date: 05/23/2019              Requested by: Isaias Sakai, [address] [place], [place] [address] PCP: Isaias Sakai, DO   Assessment & Plan: Visit Diagnoses:  1. Right foot pain     Plan: 1 week status post expiration of the distal right Achilles tendon with evidence of insertional Achilles tendinitis.  Debrided the abnormal tendon as well as the posterior angle of the os calcis and any bursal tissue behind the Achilles.  Doing well.  Wound is healing without any problem.  Clips removed and Steri-Strips applied.  Reapplied a posterior splint in slight plantarflexion.  Nonweightbearing.  We will see her back in 2 weeks.  Consider application of equalizer boot at that time  Follow-Up Instructions: Return in about 2 weeks (around 06/06/2019).   Orders:  No orders of the defined types were placed in this encounter.  No orders of the defined types were placed in this encounter.     Procedures: No procedures performed   Clinical Data: No additional findings.   Subjective: Chief Complaint  Patient presents with   Right Ankle - Routine Post Op    Right ankle exploration of Achilles tendon  Patient presents today for a one week visit from surgery. She had right Achilles tendon exploration on 05/16/19. Patient states that she is doing well. She is taking oxycodone 5/325. She is in a short leg splint and nonweightbearing, using a knee scooter.  No related fever or chills.  Minimal use of oxycodone  HPI  Review of Systems   Objective: Vital Signs: BP (!) 161/87    Pulse 89    Ht 5\' 3"  (1.6 m)    Wt 199 lb (90.3 kg)    BMI 35.25 kg/m   Physical Exam  Ortho Exam posterior splint was removed.  Right lateral heel incision is healing without problem.  Clips removed.  The wound was cleaned with alcohol and Steri-Strips applied over benzoin.   Normal sensation.  No evidence of infection.  Reapplied a short leg splint with slight plantarflexion  Specialty Comments:  No specialty comments available.  Imaging: No results found.   PMFS History: Patient Active Problem List   Diagnosis Date Noted   Heel pain, chronic, right 05/02/2019   Thrush 06/11/2018   Acquired hypothyroidism 12/08/2016   Autoimmune disease ([place]) 12/06/2016   Other fatigue 12/06/2016   Raynaud's disease without gangrene 12/06/2016   High risk medication use 12/06/2016   Allergic asthma, mild intermittent, uncomplicated 19/14/7829   History of hypertension 12/06/2016   Excessive daytime sleepiness 07/26/2016   Snoring 07/26/2016   Laryngitis 11/10/2012   Postnasal drip 05/02/2012   Acute sinusitis, unspecified 10/24/2011   HYPERTENSION 12/14/2009   BRONCHITIS 12/14/2009   LUPUS 12/14/2009   History of melanoma 12/14/2009   Seasonal and perennial allergic rhinitis 12/14/2009   Past Medical History:  Diagnosis Date   Allergic rhinitis    skin test 01-18-10   Bronchitis    Lupus (systemic lupus erythematosus) (HCC)    joints and skin rash. Dr. Estanislado Pandy   Melanoma [facility])    Rhinosinusitis    Skin cancer     Family History  Problem Relation Age of Onset   Allergies Father    Heart disease  Father        bacterial endocarditis   Cancer Maternal Grandmother    Hypothyroidism Sister    Hypothyroidism Sister    Hashimoto's thyroiditis Sister    Hepatitis Son     Past Surgical History:  Procedure Laterality Date   ABDOMINAL HYSTERECTOMY     KNEE ARTHROPLASTY     skin cancer extraction     Social History   Occupational History   Occupation: special ed [employer]  Tobacco Use   Smoking status: Never Smoker   Smokeless tobacco: Never Used  Substance and Sexual Activity   Alcohol use: Yes    Comment: 1 MONTHLY   Drug use: No   Sexual activity: Not on file

## 2019-05-31 ENCOUNTER — Other Ambulatory Visit: Payer: Self-pay | Admitting: Internal Medicine

## 2019-06-06 ENCOUNTER — Other Ambulatory Visit: Payer: Self-pay

## 2019-06-06 ENCOUNTER — Ambulatory Visit (INDEPENDENT_AMBULATORY_CARE_PROVIDER_SITE_OTHER): Payer: BC Managed Care – PPO | Admitting: Orthopaedic Surgery

## 2019-06-06 ENCOUNTER — Encounter: Payer: Self-pay | Admitting: Orthopaedic Surgery

## 2019-06-06 VITALS — BP 157/87 | HR 82 | Ht 63.0 in | Wt 199.0 lb

## 2019-06-06 DIAGNOSIS — G8929 Other chronic pain: Secondary | ICD-10-CM

## 2019-06-06 DIAGNOSIS — M79671 Pain in right foot: Secondary | ICD-10-CM

## 2019-06-06 NOTE — Progress Notes (Signed)
Office Visit Note   Patient: Misty Curtis           Date of Birth: [birth year]           MRN: [id number] Visit Date: 06/06/2019              Requested by: Isaias Sakai, [place],  [place] [address] PCP: Isaias Sakai, DO   Assessment & Plan: Visit Diagnoses:  1. Heel pain, chronic, right     Plan: 3 weeks status post exploration of right Achilles tendon insertion.  I resected retrocalcaneal bursa and the posterior angle of the os calcis as well as any abnormal tendon.  She is doing very well.  Incision healing without problem.  Neurologically intact.  No evidence of any complications.  Will apply patient's equalizer boot.  Continue with minimal weightbearing for 2 more weeks and return.  Start circumduction exercises as outlined  Follow-Up Instructions: Return in about 2 weeks (around 06/20/2019).   Orders:  No orders of the defined types were placed in this encounter.  No orders of the defined types were placed in this encounter.     Procedures: No procedures performed   Clinical Data: No additional findings.   Subjective: Chief Complaint  Patient presents with  . Right Ankle - Routine Post Op    DOS 05/16/19 Right Ach debridement  Patient presents now two weeks out from right ankle surgery. She had right achilles debridement and exploration on 05/16/19. She is currently in a short leg splint. She is doing well. She is not taking anything for pain.   HPI  Review of Systems   Objective: Vital Signs: BP (!) 157/87   Pulse 82   Ht 5\' 3"  (1.6 m)   Wt 199 lb (90.3 kg)   BMI 35.25 kg/m   Physical Exam  Ortho Exam right heel incision healing without problem.  No swelling or redness.  Neurologically intact.  Little bit stiff as expected as she has been in the posterior splint for 3 weeks  Specialty Comments:  No specialty comments available.  Imaging: No results found.   PMFS History: Patient Active Problem List   Diagnosis Date Noted  . Heel pain, chronic, right 05/02/2019  . Thrush 06/11/2018  . Acquired hypothyroidism 12/08/2016  . Autoimmune disease ([place]) 12/06/2016  . Other fatigue 12/06/2016  . Raynaud's disease without gangrene 12/06/2016  . High risk medication use 12/06/2016  . Allergic asthma, mild intermittent, uncomplicated 45/36/4680  . History of hypertension 12/06/2016  . Excessive daytime sleepiness 07/26/2016  . Snoring 07/26/2016  . Laryngitis 11/10/2012  . Postnasal drip 05/02/2012  . Acute sinusitis, unspecified 10/24/2011  . HYPERTENSION 12/14/2009  . BRONCHITIS 12/14/2009  . LUPUS 12/14/2009  . History of melanoma 12/14/2009  . Seasonal and perennial allergic rhinitis 12/14/2009   Past Medical History:  Diagnosis Date  . Allergic rhinitis    skin test 01-18-10  . Bronchitis   . Lupus (systemic lupus erythematosus) (HCC)    joints and skin rash. Dr. Estanislado Pandy  . Melanoma ([place])   . Rhinosinusitis   . Skin cancer     Family History  Problem Relation Age of Onset  . Allergies Father   . Heart disease Father        bacterial endocarditis  . Cancer Maternal Grandmother   . Hypothyroidism Sister   . Hypothyroidism Sister   . Hashimoto's thyroiditis Sister   . Hepatitis Son     Past Surgical History:  Procedure Laterality Date  . ABDOMINAL HYSTERECTOMY    . KNEE ARTHROPLASTY    . skin cancer extraction     Social History   Occupational History  . 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 Smoking status: Never Smoker  . Smokeless tobacco: Never Used  Substance and Sexual Activity  . Alcohol use: Yes    Comment: 1 MONTHLY  . Drug use: No  . Sexual activity: Not on file

## 2019-06-10 ENCOUNTER — Other Ambulatory Visit: Payer: Self-pay

## 2019-06-10 ENCOUNTER — Ambulatory Visit: Payer: BC Managed Care – PPO | Admitting: Internal Medicine

## 2019-06-10 ENCOUNTER — Encounter: Payer: Self-pay | Admitting: Internal Medicine

## 2019-06-10 DIAGNOSIS — J4520 Mild intermittent asthma, uncomplicated: Secondary | ICD-10-CM | POA: Diagnosis not present

## 2019-06-10 DIAGNOSIS — J302 Other seasonal allergic rhinitis: Secondary | ICD-10-CM | POA: Diagnosis not present

## 2019-06-10 DIAGNOSIS — M329 Systemic lupus erythematosus, unspecified: Secondary | ICD-10-CM | POA: Diagnosis not present

## 2019-06-10 DIAGNOSIS — J3089 Other allergic rhinitis: Secondary | ICD-10-CM

## 2019-06-10 NOTE — Assessment & Plan Note (Signed)
Continue MTX. Not on prednisone. Managed elsewhere.

## 2019-06-10 NOTE — Progress Notes (Signed)
Patient ID: Misty Curtis, female    DOB: 1960-12-25, 58 y.o.   MRN: [id number]  HPI female never smoker followed for allergy, bronchitis, complicated by HBP, lupus, Somnolence( [place])  ---------------------------------------------------  06/07/2018- female never smoker followed for allergy, bronchitis, complicated by HBP, lupus,  -----Bronchitis: Pt states she is doing well-slight SOB and wheezing with hot weather.  Symbicort 160, Singulair, also note modafinil (Neurology), methotrexate Sleep apnea was ruled out- fatigue attributed to her Lupus.  Not wheezing at night and no significant exacerbations. Humidity bothers.  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  06/10/2019-  58 yo female never smoker followed for allergy, bronchitis, complicated by HBP, lupus, Somnolence ([place]) -----pt has recent foot surgery (approx. 3-4 weeks ago), breathing at baseline, worse with pollen & weather changes Singulair, MTX, Symbicort 160, albuterol hfa Denies cough or hives/ angioedema from lisinopril Had surgery for ruptured achilles tendon. Using rescue inhaler 1X/ month. No sleep disturbance. Feels well controlled.  Review of Systems-see HPI  + = positive Constitutional:   No-   weight loss, night sweats, fevers, chills, fatigue, lassitude. HEENT:   No-  headaches, difficulty swallowing, tooth/dental problems, sore throat,       No-  sneezing, itching, +ear ache, +nasal congestion, +post nasal drip,  CV:  No-   chest pain, orthopnea, PND, swelling in lower extremities, anasarca, dizziness, palpitations Resp: No-   shortness of breath with exertion or at rest.              No-   productive cough,  No non-productive cough,  No- coughing up of blood.              No-   change in color of mucus.  Little wheezing.   Skin: +rash or lesions. GI:  No-   heartburn, indigestion, abdominal pain, nausea, vomiting,  GU:  MS:  No-   joint pain or swelling.  Neuro-     nothing unusual Psych:  No-  change in mood or affect. No depression or anxiety.  No memory loss.  Objective:   Physical Exam General- Alert, Oriented, Affect-appropriate, Distress- none acute, + overweight Skin- rash-none, lesions- none, excoriation- none Lymphadenopathy- none Head- atraumatic            Eyes- Gross vision intact, PERRLA, conjunctivae clear secretions            Ears- Hearing, canals-normal. TMs look normal            Nose- clear, no-Septal dev, mucus, polyps, erosion, perforation             Throat- Mallampati II , mucosa + thrush, drainage- none, tonsils- atrophic.                    No visible postnasal drainage and minimal redness of her pharynx.                  Neck- flexible , trachea midline, no stridor , thyroid nl, carotid no bruit Chest - symmetrical excursion , unlabored           Heart/CV- RRR , no murmur , no gallop  , no rub, nl s1 s2                           - JVD- none , edema- none, stasis changes- none, varices- none           Lung- clear to P&A, wheeze- none, cough- none ,  dullness-none, rub- none           Chest wall-  Abd-  Br/ Gen/ Rectal- Not done, not indicated Extrem- +R foot in boot Neuro- grossly intact to observation

## 2019-06-10 NOTE — Assessment & Plan Note (Signed)
Very well controlled despite Spring pollen season and wet weather this year. She denies additional needs. Discussed Covid precautions.  Plan- continue current meds

## 2019-06-10 NOTE — Patient Instructions (Signed)
Glad you are doing as well as you are. Hope the foot heals up quickly.  We can continue current meds. Please call if we can help.

## 2019-06-10 NOTE — Assessment & Plan Note (Signed)
Tolerating lisinopril w/o angioedema or cough.  Uses otc antihistamine if needed

## 2019-06-18 ENCOUNTER — Encounter: Payer: Self-pay | Admitting: Orthopaedic Surgery

## 2019-06-18 ENCOUNTER — Ambulatory Visit (INDEPENDENT_AMBULATORY_CARE_PROVIDER_SITE_OTHER): Payer: BC Managed Care – PPO | Admitting: Orthopaedic Surgery

## 2019-06-18 ENCOUNTER — Other Ambulatory Visit: Payer: Self-pay

## 2019-06-18 VITALS — BP 164/86 | HR 80 | Ht 63.0 in | Wt 199.0 lb

## 2019-06-18 DIAGNOSIS — M79671 Pain in right foot: Secondary | ICD-10-CM

## 2019-06-18 NOTE — Progress Notes (Signed)
Office Visit Note   Patient: Misty Curtis           Date of Birth: 27-Jun-1961           MRN: [id number] Visit Date: 06/18/2019              Requested by: Isaias Sakai, [place],   [id number] PCP: Isaias Sakai, DO   Assessment & Plan: Visit Diagnoses:  1. Right foot pain     Plan: 5 weeks status post expiration of right Achilles tendon insertion and doing very well.  May now bear weight with crutches using the equalizer boot.  Continue with exercises.  Office 2 weeks.  Not having any appreciable pain  Follow-Up Instructions: Return in about 2 weeks (around 07/02/2019).   Orders:  No orders of the defined types were placed in this encounter.  No orders of the defined types were placed in this encounter.     Procedures: No procedures performed   Clinical Data: No additional findings.   Subjective: Chief Complaint  Patient presents with  . Right Ankle - Routine Post Op    Right Achilles debridement DOS 05/16/2019  Patient is presenting one month out from right Achilles debridement. Her surgery was on 05/16/2019. She is wearing an Manufacturing systems engineer. She said that she tripped the other day, but doing well.   HPI  Review of Systems   Objective: Vital Signs: BP (!) 164/86   Pulse 80   Ht 5\' 3"  (1.6 m)   Wt 199 lb (90.3 kg)   BMI 35.25 kg/m   Physical Exam  Ortho Exam right Achilles insertion is healing without problem.  Normal sensation.  No edema of the ankle or foot.  Normal sensation.  Better range of motion since she started doing the exercises.  No calf pain .no appreciable pain at the Achilles insertion  Specialty Comments:  No specialty comments available.  Imaging: No results found.   PMFS History: Patient Active Problem List   Diagnosis Date Noted  . Heel pain, chronic, right 05/02/2019  . Thrush 06/11/2018  . Acquired hypothyroidism 12/08/2016  . Autoimmune disease ([place]) 12/06/2016  . Other  fatigue 12/06/2016  . Raynaud's disease without gangrene 12/06/2016  . High risk medication use 12/06/2016  . Allergic asthma, mild intermittent, uncomplicated 19/14/7829  . History of hypertension 12/06/2016  . Excessive daytime sleepiness 07/26/2016  . Snoring 07/26/2016  . Laryngitis 11/10/2012  . Postnasal drip 05/02/2012  . Acute sinusitis, unspecified 10/24/2011  . HYPERTENSION 12/14/2009  . BRONCHITIS 12/14/2009  . LUPUS 12/14/2009  . History of melanoma 12/14/2009  . Seasonal and perennial allergic rhinitis 12/14/2009   Past Medical History:  Diagnosis Date  . Allergic rhinitis    skin test 01-18-10  . Bronchitis   . Lupus (systemic lupus erythematosus) (HCC)    joints and skin rash. Dr. Estanislado Pandy  . Melanoma ([place])   . Rhinosinusitis   . Skin cancer     Family History  Problem Relation Age of Onset  . Allergies Father   . Heart disease Father        bacterial endocarditis  . Cancer Maternal Grandmother   . Hypothyroidism Sister   . Hypothyroidism Sister   . Hashimoto's thyroiditis Sister   . Hepatitis Son     Past Surgical History:  Procedure Laterality Date  . ABDOMINAL HYSTERECTOMY    . KNEE ARTHROPLASTY    . skin cancer extraction     Social History  Occupational History  . 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 Smoking status: Never Smoker  . Smokeless tobacco: Never Used  Substance and Sexual Activity  . Alcohol use: Yes    Comment: 1 MONTHLY  . Drug use: No  . Sexual activity: Not on file

## 2019-06-21 ENCOUNTER — Other Ambulatory Visit: Payer: Self-pay | Admitting: Internal Medicine

## 2019-06-21 ENCOUNTER — Other Ambulatory Visit: Payer: Self-pay | Admitting: Rheumatology

## 2019-06-21 DIAGNOSIS — M359 Systemic involvement of connective tissue, unspecified: Secondary | ICD-10-CM

## 2019-06-24 NOTE — Telephone Encounter (Signed)
Last Visit: 03/15/2019 Next Visit: 07/17/19 Labs: 03/17/19 Glucose is elevated-127. Rest of CMP WNL. CBC WNL.  PLQ eye exam:  12/07/18 WNL  Okay to refill per Dr. Estanislado Pandy

## 2019-07-01 ENCOUNTER — Other Ambulatory Visit: Payer: Self-pay | Admitting: Rheumatology

## 2019-07-02 NOTE — Telephone Encounter (Signed)
Ok to refill 30-day supply

## 2019-07-02 NOTE — Telephone Encounter (Signed)
Last Visit:03/15/2019 Next Visit:07/17/19 Labs: 03/17/19 Glucose is elevated-127. Rest of CMP WNL. CBC WNL.  Patient to update labs at appointment on 07/17/19  Okay to refill 30 day supply MTX?

## 2019-07-04 ENCOUNTER — Encounter: Payer: Self-pay | Admitting: Orthopaedic Surgery

## 2019-07-04 ENCOUNTER — Other Ambulatory Visit: Payer: Self-pay

## 2019-07-04 ENCOUNTER — Ambulatory Visit (INDEPENDENT_AMBULATORY_CARE_PROVIDER_SITE_OTHER): Payer: BC Managed Care – PPO | Admitting: Orthopaedic Surgery

## 2019-07-04 VITALS — BP 161/90 | HR 81 | Ht 63.0 in | Wt 199.0 lb

## 2019-07-04 DIAGNOSIS — M79671 Pain in right foot: Secondary | ICD-10-CM

## 2019-07-04 DIAGNOSIS — G8929 Other chronic pain: Secondary | ICD-10-CM

## 2019-07-04 NOTE — Progress Notes (Signed)
Office Visit Note  Patient: Misty Curtis             Date of Birth: 1961-08-29           MRN: [id number]             PCP: Isaias Sakai, DO Referring: Alonna Buckler* Visit Date: 07/17/2019 Occupation: @GUAROCC @  Subjective:  Fatigue   History of Present Illness: Misty Curtis is a 58 y.o. female with history of autoimmune disease.  She is taking PLQ 200 mg 1 tablet in the morning and 1/2 tablet in the evening, MTX 5 tablets po once weekly, and folic acid 2 mg po daily.  She has not missed any doses of methotrexate or Plaquenil.  She states that her TSH was elevated on 07/15/2019 and her PCP increased her dose of Synthroid yesterday.  She states that she has been noticing increased fatigue, weight gain, and hair loss which she attributes to her abnormal thyroid function.  She continues to have right heel pain.  She continues to follow-up with Dr. with her.  She is no longer wearing her boot using a crutch.  She states that her left shoulder is at some discomfort and stiffness since using the crutch.  She continues have right trochanter bursitis due to gait change while wearing the boot.  She has intermittent right wrist pain and swelling.  She is having some discomfort in the left second MCP joint but no swelling noted.  She denies any recent rashes or photosensitivity.  She continues have recurrent oral ulcerations.  She does have chronic sicca symptoms.  She has intermittent symptoms of Raynaud's in her fingertips and toes.  She denies any digital ulcerations or signs of gangrene.  Activities of Daily Living:  Patient reports morning stiffness for 20-30 minutes.   Patient Reports nocturnal pain.  Difficulty dressing/grooming: Denies Difficulty climbing stairs: Denies Difficulty getting out of chair: Denies Difficulty using hands for taps, buttons, cutlery, and/or writing: Denies  Review of Systems  Constitutional: Positive for fatigue.  HENT: Positive for mouth sores and  mouth dryness. Negative for nose dryness.   Eyes: Positive for dryness. Negative for pain and visual disturbance.  Respiratory: Negative for cough, hemoptysis, shortness of breath and difficulty breathing.   Cardiovascular: Negative for chest pain, palpitations, hypertension and swelling in legs/feet.  Gastrointestinal: Positive for constipation. Negative for blood in stool and diarrhea.  Endocrine: Negative for increased urination.  Genitourinary: Negative for painful urination.  Musculoskeletal: Positive for arthralgias, joint pain, joint swelling and morning stiffness. Negative for myalgias, muscle weakness, muscle tenderness and myalgias.  Skin: Positive for color change and hair loss. Negative for pallor, rash, nodules/bumps, skin tightness, ulcers and sensitivity to sunlight.  Allergic/Immunologic: Negative for susceptible to infections.  Neurological: Negative for dizziness, numbness, headaches and weakness.  Hematological: Negative for swollen glands.  Psychiatric/Behavioral: Negative for depressed mood and sleep disturbance. The patient is not nervous/anxious.     PMFS History:  Patient Active Problem List   Diagnosis Date Noted  . Heel pain, chronic, right 05/02/2019  . Thrush 06/11/2018  . Acquired hypothyroidism 12/08/2016  . Autoimmune disease ([place]) 12/06/2016  . Other fatigue 12/06/2016  . Raynaud's disease without gangrene 12/06/2016  . High risk medication use 12/06/2016  . Allergic asthma, mild intermittent, uncomplicated 63/33/5456  . History of hypertension 12/06/2016  . Excessive daytime sleepiness 07/26/2016  . Snoring 07/26/2016  . Laryngitis 11/10/2012  . Postnasal drip 05/02/2012  . Acute sinusitis, unspecified 10/24/2011  .  HYPERTENSION 12/14/2009  . BRONCHITIS 12/14/2009  . LUPUS 12/14/2009  . History of melanoma 12/14/2009  . Seasonal and perennial allergic rhinitis 12/14/2009    Past Medical History:  Diagnosis Date  . Allergic rhinitis    skin  test 01-18-10  . Bronchitis   . Lupus (systemic lupus erythematosus) (HCC)    joints and skin rash. Dr. Estanislado Pandy  . Melanoma ([place])   . Rhinosinusitis   . Skin cancer     Family History  Problem Relation Age of Onset  . Allergies Father   . Heart disease Father        bacterial endocarditis  . Cancer Maternal Grandmother   . Hypothyroidism Sister   . Hypothyroidism Sister   . Hashimoto's thyroiditis Sister   . Hepatitis Son    Past Surgical History:  Procedure Laterality Date  . ABDOMINAL HYSTERECTOMY    . ACHILLES TENDON REPAIR Right    Dr. Durward Fortes   . KNEE ARTHROPLASTY    . skin cancer extraction     Social History   Social History Narrative  . Not on file   Immunization History  Administered Date(s) Administered  . Influenza Split 09/19/2011, 09/12/2012, 09/02/2013  . Influenza-Unspecified 10/19/2014  . Pneumococcal Polysaccharide-23 08/19/2010     Objective: Vital Signs: BP (!) 149/86 (BP Location: Left Arm, Patient Position: Sitting, Cuff Size: Normal)   Pulse 72   Resp 14   Ht 5\' 3"  (1.6 m)   Wt 201 lb (91.2 kg)   BMI 35.61 kg/m    Physical Exam Vitals signs and nursing note reviewed.  Constitutional:      Appearance: She is well-developed.  HENT:     Head: Normocephalic and atraumatic.  Eyes:     Conjunctiva/sclera: Conjunctivae normal.  Neck:     Musculoskeletal: Normal range of motion.  Cardiovascular:     Rate and Rhythm: Normal rate and regular rhythm.     Heart sounds: Normal heart sounds.  Pulmonary:     Effort: Pulmonary effort is normal.     Breath sounds: Normal breath sounds.  Abdominal:     General: Bowel sounds are normal.     Palpations: Abdomen is soft.  Lymphadenopathy:     Cervical: No cervical adenopathy.  Skin:    General: Skin is warm and dry.     Capillary Refill: Capillary refill takes less than 2 seconds.  Neurological:     Mental Status: She is alert and oriented to person, place, and time.  Psychiatric:         Behavior: Behavior normal.      Musculoskeletal Exam: C-spine, thoracic spine, lumbar spine good range of motion.  No midline spinal tenderness.  No SI joint tenderness.  Shoulder joints, elbow joints, wrist joints, MCPs, PIPs and DIPs good range of motion no synovitis.  She has tenderness over the ulnar styloid of the right wrist.  Tenderness of the left second MCP joint.  Hip joints have good range of motion with no discomfort.  Tenderness over the right trochanteric bursa.  Knee joints have slightly limited extension with some discomfort.  She has bilateral knee crepitus.  Tenderness of the right Achilles tendon insertion site.  CDAI Exam: CDAI Score: - Patient Global: -; Provider Global: - Swollen: -; Tender: - Joint Exam   No joint exam has been documented for this visit   There is currently no information documented on the homunculus. Go to the Rheumatology activity and complete the homunculus joint exam.  Investigation: No additional  findings.  Imaging: No results found.  Recent Labs: Lab Results  Component Value Date   WBC 8.2 03/15/2019   HGB 15.1 03/15/2019   PLT 302 03/15/2019   NA 142 03/15/2019   K 4.0 03/15/2019   CL 104 03/15/2019   CO2 30 03/15/2019   GLUCOSE 127 (H) 03/15/2019   BUN 18 03/15/2019   CREATININE 1.00 03/15/2019   BILITOT 0.3 03/15/2019   ALKPHOS 82 06/02/2017   AST 28 03/15/2019   ALT 18 03/15/2019   PROT 6.6 03/15/2019   ALBUMIN 4.1 06/02/2017   CALCIUM 9.4 03/15/2019   GFRAA 71 03/15/2019    Speciality Comments: PLQ eye exam:  12/07/18 WNL at Dr Myna Hidalgo. Hulan Saas, OD Follow up in 1 year   Procedures:  No procedures performed Allergies: Latex, Neomycin-bacitracin zn-polymyx, Onion, and Strawberry extract   Assessment / Plan:     Visit Diagnoses: Autoimmune disease ([place]) -  History of positive ANA, DS DNA, Raynauds, fatigue, oral ulcers, nasal ulcers, arthralgias,sicca: She has not had any signs or symptoms of a flare recently.  She is  clinically doing well on Plaquenil 200 mg 1 tab in the morning half tablet in the evening and methotrexate 5 tablets by mouth once weekly.  She has not missed any doses of Plaquenil or methotrexate recently.  She has no synovitis on exam.  She has not had any recent rashes or photosensitivity.  She has noticed worsening hair loss, fatigue, and weight gain which she attributes to hypothyroidism.  Her TSH was 5.88 on 07/15/2019.  Her Synthroid dose was increased yesterday by her PCP.  She continues to have recurrent oral ulcerations but is not any nasal ulcerations recently.  She does have chronic sicca symptoms.  She uses Biotene products for mouth dryness.  She continues to have intermittent symptoms of Raynolds but has not had any digital ulcerations or signs of gangrene.  She will continue taking Plaquenil and methotrexate as prescribed.  She does not need any refills at this time.  Complements were within normal limits and double-stranded DNA was 1 on 03/15/2019.  We will continue to monitor lab work on a regular basis.  She was advised to notify us if she develops any new or worsening symptoms.  She will follow-up in the office in 5 months.  High risk medication use - PLQ 200 mg 1 tablet in the morning and 1/2 tablet in the evening, MTX 5 tablets by mouth once a week, and folic acid 1 mg daily.  CBC and CMP are within normal limits on 07/15/2019.  She was advised to hold methotrexate if she develops any signs or symptoms of an infection and to resume once the infection is cleared.  The importance of social distancing and following a standard precautions recommended by the CDC were discussed.  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  These documents will be scanned into epic.  Raynaud's disease without gangrene - She has intermittent symptoms of Raynaud's in fingertips and toes.  She has no digital ulcerations or signs of gangrene.  She was encouraged to keep her core body  temperature warm and wear gloves and socks on a regular basis.  Right carpal tunnel syndrome - She is asymptomatic at this time.   Achilles tendinitis of right lower extremity - She continues to follow up with Dr. Durward Fortes. She is no longer wearing a boot or using crutches.    History of melanoma - She follows up with her  dermatologist on a regular basis.  She had a skin check yesterday.   History of hypothyroidism - TSH was 5.88 on 05/15/19.  Her dose of Synthroid was increased yesterday.  She has been having increased fatigue, weight gain, and hair loss which she attributes to abnormal thyroid functioning.  History of fatigue - She has been experiencing increased fatigue related to hypothyroidism.  Her dose of Synthroid was increased yesterday.  Other medical conditions are listed as follows:   History of sleep apnea   History of asthma  History of hypertension   Orders: No orders of the defined types were placed in this encounter.  No orders of the defined types were placed in this encounter.   Face-to-face time spent with patient was 30 minutes. Greater than 50% of time was spent in counseling and coordination of care.  Follow-Up Instructions: Return in about 5 months (around 12/17/2019) for Autoimmune Disease.   Ofilia Neas, PA-C  Note - This record has been created using Dragon software.  Chart creation errors have been sought, but may not always  have been located. Such creation errors do not reflect on  the standard of medical care.

## 2019-07-04 NOTE — Progress Notes (Signed)
Office Visit Note   Patient: Misty Curtis           Date of Birth: 12-27-1960           MRN: [id number] Visit Date: 07/04/2019              Requested by: Isaias Sakai, [address],  [place] [address] PCP: Isaias Sakai, DO   Assessment & Plan: Visit Diagnoses:  1. Heel pain, chronic, right     Plan: 7 weeks status post expiration of posterior aspect of right heel with excision of posterior heel spurs, posterior angle of the os calcis and insertional inflammatory tissue of the Achilles tendon.  Doing very well.  Does not have the preoperative pain.  May progress to ambulating without equalizer or cane and return in 1 month.  Continue with stretching exercises  Follow-Up Instructions: Return in about 1 month (around 08/04/2019).   Orders:  No orders of the defined types were placed in this encounter.  No orders of the defined types were placed in this encounter.     Procedures: No procedures performed   Clinical Data: No additional findings.   Subjective: Chief Complaint  Patient presents with  . Right Ankle - Routine Post Op    Right Achilles debridement DOS 05/16/2019  Patient presents today for follow up on her right ankle. She is now 7 weeks out from right Achilles debridement. Her surgery was 05/16/2019. Doing well. No complaints. She is walking in her boot with a cane.   HPI  Review of Systems   Objective: Vital Signs: BP (!) 161/90   Pulse 81   Ht 5\' 3"  (1.6 m)   Wt 199 lb (90.3 kg)   BMI 35.25 kg/m   Physical Exam  Ortho Exam right foot posterior incision healing without problem.  A little bit of numbness around the incision.  Still some mild swelling in the operative site but no redness or erythema.  Dorsum of the foot without problem moving toes without problem.  Able to plantarflex about 30 to 40 degrees and dorsiflex maybe 10 degrees. no localized areas of tenderness  Specialty Comments:  No specialty  comments available.  Imaging: No results found.   PMFS History: Patient Active Problem List   Diagnosis Date Noted  . Heel pain, chronic, right 05/02/2019  . Thrush 06/11/2018  . Acquired hypothyroidism 12/08/2016  . Autoimmune disease ([place]) 12/06/2016  . Other fatigue 12/06/2016  . Raynaud's disease without gangrene 12/06/2016  . High risk medication use 12/06/2016  . Allergic asthma, mild intermittent, uncomplicated 10/15/2535  . History of hypertension 12/06/2016  . Excessive daytime sleepiness 07/26/2016  . Snoring 07/26/2016  . Laryngitis 11/10/2012  . Postnasal drip 05/02/2012  . Acute sinusitis, unspecified 10/24/2011  . HYPERTENSION 12/14/2009  . BRONCHITIS 12/14/2009  . LUPUS 12/14/2009  . History of melanoma 12/14/2009  . Seasonal and perennial allergic rhinitis 12/14/2009   Past Medical History:  Diagnosis Date  . Allergic rhinitis    skin test 01-18-10  . Bronchitis   . Lupus (systemic lupus erythematosus) (HCC)    joints and skin rash. Dr. Estanislado Pandy  . Melanoma ([place])   . Rhinosinusitis   . Skin cancer     Family History  Problem Relation Age of Onset  . Allergies Father   . Heart disease Father        bacterial endocarditis  . Cancer Maternal Grandmother   . Hypothyroidism Sister   . Hypothyroidism Sister   .  Hashimoto's thyroiditis Sister   . Hepatitis Son     Past Surgical History:  Procedure Laterality Date  . ABDOMINAL HYSTERECTOMY    . KNEE ARTHROPLASTY    . skin cancer extraction     Social History   Occupational History  . 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 Smoking status: Never Smoker  . Smokeless tobacco: Never Used  Substance and Sexual Activity  . Alcohol use: Yes    Comment: 1 MONTHLY  . Drug use: No  . Sexual activity: Not on file

## 2019-07-17 ENCOUNTER — Ambulatory Visit: Payer: BC Managed Care – PPO | Admitting: Physician Assistant

## 2019-07-17 ENCOUNTER — Encounter: Payer: Self-pay | Admitting: Physician Assistant

## 2019-07-17 ENCOUNTER — Other Ambulatory Visit: Payer: Self-pay

## 2019-07-17 VITALS — BP 149/86 | HR 72 | Resp 14 | Ht 63.0 in | Wt 201.0 lb

## 2019-07-17 DIAGNOSIS — Z8679 Personal history of other diseases of the circulatory system: Secondary | ICD-10-CM

## 2019-07-17 DIAGNOSIS — Z79899 Other long term (current) drug therapy: Secondary | ICD-10-CM | POA: Diagnosis not present

## 2019-07-17 DIAGNOSIS — Z8709 Personal history of other diseases of the respiratory system: Secondary | ICD-10-CM

## 2019-07-17 DIAGNOSIS — G5601 Carpal tunnel syndrome, right upper limb: Secondary | ICD-10-CM | POA: Diagnosis not present

## 2019-07-17 DIAGNOSIS — M7661 Achilles tendinitis, right leg: Secondary | ICD-10-CM

## 2019-07-17 DIAGNOSIS — Z87898 Personal history of other specified conditions: Secondary | ICD-10-CM

## 2019-07-17 DIAGNOSIS — I7300 Raynaud's syndrome without gangrene: Secondary | ICD-10-CM | POA: Diagnosis not present

## 2019-07-17 DIAGNOSIS — Z8669 Personal history of other diseases of the nervous system and sense organs: Secondary | ICD-10-CM

## 2019-07-17 DIAGNOSIS — Z8639 Personal history of other endocrine, nutritional and metabolic disease: Secondary | ICD-10-CM

## 2019-07-17 DIAGNOSIS — Z85820 Personal history of malignant melanoma of skin: Secondary | ICD-10-CM

## 2019-07-17 DIAGNOSIS — M359 Systemic involvement of connective tissue, unspecified: Secondary | ICD-10-CM

## 2019-08-01 ENCOUNTER — Other Ambulatory Visit: Payer: Self-pay | Admitting: Physician Assistant

## 2019-08-01 ENCOUNTER — Other Ambulatory Visit: Payer: Self-pay

## 2019-08-01 ENCOUNTER — Ambulatory Visit (INDEPENDENT_AMBULATORY_CARE_PROVIDER_SITE_OTHER): Payer: BC Managed Care – PPO | Admitting: Orthopaedic Surgery

## 2019-08-01 ENCOUNTER — Encounter: Payer: Self-pay | Admitting: Orthopaedic Surgery

## 2019-08-01 VITALS — BP 170/79 | HR 71 | Ht 63.0 in | Wt 201.0 lb

## 2019-08-01 DIAGNOSIS — M79671 Pain in right foot: Secondary | ICD-10-CM

## 2019-08-01 DIAGNOSIS — G8929 Other chronic pain: Secondary | ICD-10-CM

## 2019-08-01 NOTE — Progress Notes (Signed)
Office Visit Note   Patient: Misty Curtis           Date of Birth: October 28, 1961           MRN: [id number] Visit Date: 08/01/2019              Requested by: Isaias Sakai, [address],  [place] [address] PCP: Isaias Sakai, DO   Assessment & Plan: Visit Diagnoses:  1. Heel pain, chronic, right     Plan: Nearly 3 months status post expiration of the right Achilles insertion with excision of ectopic calcification and abnormal tendon as well as the retrocalcaneal bursa and posterior angle of the os calcis.  Very happy with the result she feels like at this point she is probably "80%".  Does not use any kind of a boot and not taking any medicines.  Does not have any the pain that she had preoperatively.  Continue with activities as tolerated and range of motion exercises.  We will see back as needed  Follow-Up Instructions: Return if symptoms worsen or fail to improve.   Orders:  No orders of the defined types were placed in this encounter.  No orders of the defined types were placed in this encounter.     Procedures: No procedures performed   Clinical Data: No additional findings.   Subjective: Chief Complaint  Patient presents with  . Right Ankle - Follow-up    Right Achilles debridement DOS 05/16/2019  Patient presents today for a follow up on her ankle. She had her right Achilles debrided on 05/16/2019. She is doing well. She is walking without a walking boot. She is doing her home exercises.   HPI  Review of Systems   Objective: Vital Signs: BP (!) 170/79   Pulse 71   Ht 5\' 3"  (1.6 m)   Wt 201 lb (91.2 kg)   BMI 35.61 kg/m   Physical Exam  Ortho Exam right ankle with minimal edema.  There is still some thickness to the skin scar posteriorly which has healed without problem.  No pain over the Achilles tendon.  No significant prominence of the posterior Achilles.    Neurologically intact  Specialty Comments:  No specialty  comments available.  Imaging: No results found.   PMFS History: Patient Active Problem List   Diagnosis Date Noted  . Heel pain, chronic, right 05/02/2019  . Thrush 06/11/2018  . Acquired hypothyroidism 12/08/2016  . Autoimmune disease ([place]) 12/06/2016  . Other fatigue 12/06/2016  . Raynaud's disease without gangrene 12/06/2016  . High risk medication use 12/06/2016  . Allergic asthma, mild intermittent, uncomplicated 27/78/2423  . History of hypertension 12/06/2016  . Excessive daytime sleepiness 07/26/2016  . Snoring 07/26/2016  . Laryngitis 11/10/2012  . Postnasal drip 05/02/2012  . Acute sinusitis, unspecified 10/24/2011  . HYPERTENSION 12/14/2009  . BRONCHITIS 12/14/2009  . LUPUS 12/14/2009  . History of melanoma 12/14/2009  . Seasonal and perennial allergic rhinitis 12/14/2009   Past Medical History:  Diagnosis Date  . Allergic rhinitis    skin test 01-18-10  . Bronchitis   . Lupus (systemic lupus erythematosus) (HCC)    joints and skin rash. Dr. Estanislado Pandy  . Melanoma ([place])   . Rhinosinusitis   . Skin cancer     Family History  Problem Relation Age of Onset  . Allergies Father   . Heart disease Father        bacterial endocarditis  . Cancer Maternal Grandmother   .  Hypothyroidism Sister   . Hypothyroidism Sister   . Hashimoto's thyroiditis Sister   . Hepatitis Son     Past Surgical History:  Procedure Laterality Date  . ABDOMINAL HYSTERECTOMY    . ACHILLES TENDON REPAIR Right    Dr. Durward Fortes   . KNEE ARTHROPLASTY    . skin cancer extraction     Social History   Occupational History  . 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 Smoking status: Never Smoker  . Smokeless tobacco: Never Used  Substance and Sexual Activity  . Alcohol use: Yes    Comment: 1 MONTHLY  . Drug use: No  . Sexual activity: Not on file

## 2019-08-02 NOTE — Telephone Encounter (Signed)
Last Visit: 07/17/19 Next Visit: 12/09/19 Labs 07/15/19 Lymphs Absolute 3.4 Sodium 145   Okay to refill per Dr. Estanislado Pandy

## 2019-08-19 ENCOUNTER — Other Ambulatory Visit: Payer: Self-pay | Admitting: Rheumatology

## 2019-08-19 DIAGNOSIS — M359 Systemic involvement of connective tissue, unspecified: Secondary | ICD-10-CM

## 2019-08-19 NOTE — Telephone Encounter (Signed)
Last Visit: 07/17/19 Next Visit: 12/09/19 Labs 07/15/19 Lymphs Absolute 3.4 Sodium 145 PLQ eye exam:  12/07/18 WNL   Okay to refill per Dr. Estanislado Pandy

## 2019-10-10 ENCOUNTER — Other Ambulatory Visit: Payer: Self-pay | Admitting: Internal Medicine

## 2019-10-21 ENCOUNTER — Other Ambulatory Visit: Payer: Self-pay | Admitting: *Deleted

## 2019-10-21 ENCOUNTER — Telehealth: Payer: Self-pay

## 2019-10-21 MED ORDER — MODAFINIL 200 MG PO TABS: 200.0000 mg | ORAL_TABLET | Freq: Every day | ORAL | 5 refills | Status: DC

## 2019-10-21 NOTE — Telephone Encounter (Signed)
Started a PA via CMM for Modafinil 200 mg   Awaiting response.  Key:  [id number]

## 2019-10-21 NOTE — Telephone Encounter (Signed)
I will send in a refill for Provigil.

## 2019-10-21 NOTE — Telephone Encounter (Signed)
Received refill request for modafinil 200mg  po daily.  Drug registry checked last fill 08-15-19 #30.

## 2019-10-23 NOTE — Telephone Encounter (Signed)
CMM approved medication. Modafinil 200 mg.   Faxed to pts pharmacy.

## 2019-12-03 ENCOUNTER — Other Ambulatory Visit: Payer: Self-pay | Admitting: Internal Medicine

## 2019-12-04 ENCOUNTER — Telehealth (INDEPENDENT_AMBULATORY_CARE_PROVIDER_SITE_OTHER): Payer: BC Managed Care – PPO | Admitting: Rheumatology

## 2019-12-04 ENCOUNTER — Encounter: Payer: Self-pay | Admitting: Rheumatology

## 2019-12-04 ENCOUNTER — Other Ambulatory Visit: Payer: Self-pay

## 2019-12-04 ENCOUNTER — Telehealth: Payer: Self-pay | Admitting: Rheumatology

## 2019-12-04 DIAGNOSIS — M359 Systemic involvement of connective tissue, unspecified: Secondary | ICD-10-CM

## 2019-12-04 DIAGNOSIS — G5601 Carpal tunnel syndrome, right upper limb: Secondary | ICD-10-CM | POA: Diagnosis not present

## 2019-12-04 DIAGNOSIS — Z8709 Personal history of other diseases of the respiratory system: Secondary | ICD-10-CM

## 2019-12-04 DIAGNOSIS — I7300 Raynaud's syndrome without gangrene: Secondary | ICD-10-CM | POA: Diagnosis not present

## 2019-12-04 DIAGNOSIS — Z87898 Personal history of other specified conditions: Secondary | ICD-10-CM

## 2019-12-04 DIAGNOSIS — Z79899 Other long term (current) drug therapy: Secondary | ICD-10-CM | POA: Diagnosis not present

## 2019-12-04 DIAGNOSIS — Z8679 Personal history of other diseases of the circulatory system: Secondary | ICD-10-CM

## 2019-12-04 DIAGNOSIS — Z8639 Personal history of other endocrine, nutritional and metabolic disease: Secondary | ICD-10-CM

## 2019-12-04 DIAGNOSIS — Z8669 Personal history of other diseases of the nervous system and sense organs: Secondary | ICD-10-CM

## 2019-12-04 DIAGNOSIS — M7661 Achilles tendinitis, right leg: Secondary | ICD-10-CM

## 2019-12-04 DIAGNOSIS — Z85820 Personal history of malignant melanoma of skin: Secondary | ICD-10-CM

## 2019-12-04 MED ORDER — HYDROXYCHLOROQUINE SULFATE 200 MG PO TABS: 300.0000 mg | ORAL_TABLET | Freq: Every day | ORAL | 2 refills | Status: DC

## 2019-12-04 NOTE — Progress Notes (Signed)
Virtual Visit via Video Note  I connected with Misty Curtis on 12/04/19 at  2:15 PM EST by a video enabled telemedicine application and verified that I am speaking with the correct person using two identifiers.  Location: Patient: Home  Provider: Clinic  This service was conducted via virtual visit.  Both audio and visual tools were used.  The patient was located at home. I was located in my office.  Consent was obtained prior to the virtual visit and is aware of possible charges through their insurance for this visit.  The patient is an established patient.  Dr. Estanislado Pandy, MD conducted the virtual visit and Hazel Sams, PA-C acted as scribe during the service.  Office staff helped with scheduling follow up visits after the service was conducted.     I discussed the limitations of evaluation and management by telemedicine and the availability of in person appointments. The patient expressed understanding and agreed to proceed.  CC: Medication monitoring  History of Present Illness: Patient is a 58 year old female with a past medical history of autoimmune disease.  She is taking plaquenil 200 mg 1 tablet by mouth in the morning and 1/2 tablet in the evening.  She has been off of MTX for 1 month due to having a lumpectomy of the left breast for an abnormal finding on her recent routine mammogram.  The mass came back benign according to the patient.  She has resumed MTX.  She has not had any signs or symptoms of an autoimmune disease flare.Her level of fatigue has been stable.  She has chronic sicca symptoms.  She continues to have intermittent symptoms of raynaud's.  She denies any recent rashes or oral/nasal ulcerations. She had a right achilles tendon rupture repair several months ago by Dr. Durward Fortes, which went well without any complications. She has persistent discomfort due to right carpal tunnel syndrome.  She was using crutches after surgery, which exacerbated the discomfort. She is not having any  other joint pain or inflammation at this time.  .   Review of Systems  Constitutional: Positive for malaise/fatigue. Negative for fever.  HENT:       +Dry mouth  Eyes: Negative for photophobia, pain, discharge and redness.       +Dry eyes  Respiratory: Negative for cough, shortness of breath and wheezing.   Cardiovascular: Negative for chest pain and palpitations.  Gastrointestinal: Negative for blood in stool, constipation and diarrhea.  Genitourinary: Negative for dysuria.  Musculoskeletal: Positive for joint pain. Negative for back pain, myalgias and neck pain.  Skin: Negative for rash.       +Raynaud's  Neurological: Negative for dizziness and headaches.  Psychiatric/Behavioral: Negative for depression. The patient is not nervous/anxious and does not have insomnia.       Observations/Objective: Physical Exam  Constitutional: She is oriented to person, place, and time and well-developed, well-nourished, and in no distress.  HENT:  Head: Normocephalic and atraumatic.  Eyes: Conjunctivae are normal.  Pulmonary/Chest: Effort normal.  Neurological: She is alert and oriented to person, place, and time.  Psychiatric: Mood, memory, affect and judgment normal.   Patient reports morning stiffness for  20-30 minutes.   Patient denies nocturnal pain.  Difficulty dressing/grooming: Denies Difficulty climbing stairs: Denies Difficulty getting out of chair: Denies Difficulty using hands for taps, buttons, cutlery, and/or writing: Denies   Assessment and Plan: Visit Diagnoses: Autoimmune disease ([place]) -  History of positive ANA, DS DNA, Raynauds, fatigue, oral ulcers, nasal ulcers, arthralgias,sicca: She  has not had any signs or symptoms of an autoimmune disease flare recently. She is clinically doing well on Plaquenil 200 mg 1 tablet in the morning and 1/2 tablet in the evening, MTX 5 tablets po once weekly, and folic acid 1 mg po daily.  She had to hold MTX for 1 month recently due to  undergoing a lumpectomy of the left breast (biopsy was benign).  She has ongoing arthralgias but no joint inflammation.  Her level of fatigue has been stable.  She has not had any recent oral or nasal ulcerations, but she has persistent sicca symptoms.  She reports intermittent symptoms of raynaud's but no digital ulcerations or signs of gangrene.  She has not had any recent rashes.  She will continue taking PLQ and MTX as prescribed.  She is overdue to update lab work and lab orders have been placed.  She was advised to notify us if she develops any new or worsening symptoms.  She will follow up in 3-4 months.   High risk medication use - PLQ 200 mg 1 tablet in the morning and 1/2 tablet in the evening, MTX 5 tablets by mouth once a week, and folic acid 1 mg daily.  CBC and CMP were drawn on 07/15/19.  She is overdue to update lab work.  Orders were placed today.   Raynaud's disease without gangrene - She has intermittent symptoms of Raynaud's but no digital ulcerations or signs of gangrene. She has encouraged to keep her core body temperature warm and to wear gloves/socks.    Right carpal tunnel syndrome - She had an exacerbation of symptoms while using crutches s/p right achilles tendon repair several month ago.   Achilles tendinitis of right lower extremity - Resolved. She continues to follow up with Dr. Durward Fortes.   Other medical conditions are listed as follows:   History of melanoma - She follows up with her dermatologist on a regular basis.   History of hypothyroidism   History of fatigue - Stable   History of sleep apnea   History of asthma  History of hypertension   Follow Up Instructions: She will follow up in 3-4 months   I discussed the assessment and treatment plan with the patient. The patient was provided an opportunity to ask questions and all were answered. The patient agreed with the plan and demonstrated an understanding of the instructions.   The patient was  advised to call back or seek an in-person evaluation if the symptoms worsen or if the condition fails to improve as anticipated.  I provided 15 minutes of non-face-to-face time during this encounter.   Bo Merino, MD   Scribed by-  Hazel Sams, PA-C

## 2019-12-04 NOTE — Telephone Encounter (Signed)
Patient needs a refill on generic Plaquenil sent to [facility] in [place].  Patient going to [facility] for labs on Monday. Please release orders.

## 2019-12-04 NOTE — Telephone Encounter (Signed)
Last Visit: 07/17/2019 Next Visit: 12/05/2019 telemedicine  Labs: 07/15/2019 WNL  Eye exam: 12/07/2018  Okay to refill per Dr. Estanislado Pandy.   Lab orders released for [facility].

## 2019-12-09 ENCOUNTER — Ambulatory Visit: Payer: BC Managed Care – PPO | Admitting: Rheumatology

## 2019-12-10 LAB — URINALYSIS, ROUTINE W REFLEX MICROSCOPIC
Bilirubin Urine: NEGATIVE
Glucose, UA: NEGATIVE
Hgb urine dipstick: NEGATIVE
Ketones, ur: NEGATIVE
Leukocytes,Ua: NEGATIVE
Nitrite: NEGATIVE
Protein, ur: NEGATIVE
Specific Gravity, Urine: 1.004 (ref 1.001–1.03)
pH: 7 (ref 5.0–8.0)

## 2019-12-10 LAB — CBC WITH DIFFERENTIAL/PLATELET
Absolute Monocytes: 598 cells/uL (ref 200–950)
Basophils Absolute: 53 cells/uL (ref 0–200)
Basophils Relative: 0.6 %
Eosinophils Absolute: 150 cells/uL (ref 15–500)
Eosinophils Relative: 1.7 %
HCT: 44.5 % (ref 35.0–45.0)
Hemoglobin: 15 g/dL (ref 11.7–15.5)
Lymphs Abs: 2966 cells/uL (ref 850–3900)
MCH: 29.4 pg (ref 27.0–33.0)
MCHC: 33.7 g/dL (ref 32.0–36.0)
MCV: 87.1 fL (ref 80.0–100.0)
MPV: 9.7 fL (ref 7.5–12.5)
Monocytes Relative: 6.8 %
Neutro Abs: 5034 cells/uL (ref 1500–7800)
Neutrophils Relative %: 57.2 %
Platelets: 325 10*3/uL (ref 140–400)
RBC: 5.11 10*6/uL — ABNORMAL HIGH (ref 3.80–5.10)
RDW: 13.1 % (ref 11.0–15.0)
Total Lymphocyte: 33.7 %
WBC: 8.8 10*3/uL (ref 3.8–10.8)

## 2019-12-10 LAB — C3 AND C4
C3 Complement: 150 mg/dL (ref 83–193)
C4 Complement: 28 mg/dL (ref 15–57)

## 2019-12-10 LAB — ANTI-DNA ANTIBODY, DOUBLE-STRANDED: ds DNA Ab: 1 IU/mL

## 2019-12-10 LAB — COMPLETE METABOLIC PANEL WITH GFR
AG Ratio: 1.5 (calc) (ref 1.0–2.5)
ALT: 16 U/L (ref 6–29)
AST: 26 U/L (ref 10–35)
Albumin: 4.2 g/dL (ref 3.6–5.1)
Alkaline phosphatase (APISO): 93 U/L (ref 37–153)
BUN: 15 mg/dL (ref 7–25)
CO2: 28 mmol/L (ref 20–32)
Calcium: 9.7 mg/dL (ref 8.6–10.4)
Chloride: 103 mmol/L (ref 98–110)
Creat: 0.73 mg/dL (ref 0.50–0.99)
GFR, Est African American: 104 mL/min/{1.73_m2} (ref >=60)
GFR, Est Non African American: 90 mL/min/{1.73_m2} (ref >=60)
Globulin: 2.8 g/dL (calc) (ref 1.9–3.7)
Glucose, Bld: 87 mg/dL (ref 65–99)
Potassium: 3.8 mmol/L (ref 3.5–5.3)
Sodium: 143 mmol/L (ref 135–146)
Total Bilirubin: 0.3 mg/dL (ref 0.2–1.2)
Total Protein: 7 g/dL (ref 6.1–8.1)

## 2019-12-10 LAB — SEDIMENTATION RATE: Sed Rate: 6 mm/h (ref 0–30)

## 2020-01-10 ENCOUNTER — Other Ambulatory Visit: Payer: Self-pay | Admitting: *Deleted

## 2020-01-10 MED ORDER — METHOTREXATE 2.5 MG PO TABS: ORAL_TABLET | ORAL | 0 refills | Status: DC

## 2020-01-10 NOTE — Telephone Encounter (Signed)
Refill request received via fax  Last Visit: 12/04/19 Next Visit: 04/08/20 Labs: 12/09/19 RBC count is borderline elevated. Rest of CBC WNL. CMP WNL  Okay to refill per Dr. Estanislado Pandy

## 2020-01-16 ENCOUNTER — Encounter: Payer: Self-pay | Admitting: Rheumatology

## 2020-01-17 ENCOUNTER — Other Ambulatory Visit: Payer: Self-pay | Admitting: *Deleted

## 2020-01-17 MED ORDER — ONDANSETRON HCL 4 MG PO TABS: 4.0000 mg | ORAL_TABLET | Freq: Four times a day (QID) | ORAL | 0 refills | Status: DC | PRN

## 2020-01-17 NOTE — Telephone Encounter (Signed)
Refill request received via fax  Last Visit: 12/04/19 Next Visit: 04/08/20  Okay to refill per Dr. Estanislado Pandy

## 2020-01-26 ENCOUNTER — Encounter: Payer: Self-pay | Admitting: Neurology

## 2020-02-13 ENCOUNTER — Encounter: Payer: Self-pay | Admitting: Rheumatology

## 2020-02-25 NOTE — Progress Notes (Signed)
PATIENT: Misty Curtis DOB: 19-Jan-1961  REASON FOR VISIT: follow up HISTORY FROM: patient  HISTORY OF PRESENT ILLNESS: Today 02/26/20  Misty Curtis is a 59 year old female with history of idiopathic hypersomnia.  She remains on Provigil 200 mg daily, however she is only taking half tablet.  When she takes the 200 mg, she says she will stay up till midnight.  When taking 100 mg, around 630, she will lay on the couch, fall asleep. She seems to do well during the day, up until 6:30 pm. 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  In the last several months, she has had Achilles tendon reconstruction, and 2 breast lumpectomies.  In the last year, she has gained about 10 pounds.  She is not sure if her increased drowsiness, is related to deconditioning or weight gain?  She would like to try dose modification.  She presents today for evaluation unaccompanied. ESS was 12.  HISTORY 02/20/2019 SS: Misty Curtis is a 60 year old female with a history of idiopathic hypersomnia.  She is currently taking Provigil 200 mg daily however she is only taking half tablet.  She reports she is tolerating the medication well.  She will take it around 6:30 AM and she reports benefit until about 9:30 PM whenever she goes to bed.  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  She reports she is able to do her job well without significant fatigue.  She returns today requesting a refill on her Provigil.  She presents today for evaluation unaccompanied.  She denies any new problems or concerns.  She is wearing a boot to her right foot that is healing from a prior injury.  REVIEW OF SYSTEMS: Out of a complete 14 system review of symptoms, the patient complains only of the following symptoms, and all other reviewed systems are negative.  Daytime drowsiness  ALLERGIES: Allergies  Allergen Reactions  . Latex Other (See Comments)  . Neomycin-Bacitracin Zn-Polymyx     REACTION: rash  . Onion     Also allergic to  peppers and hot peppers  . Strawberry Extract     HOME MEDICATIONS: Outpatient Medications Prior to Visit  Medication Sig Dispense Refill  . albuterol (PROVENTIL HFA;VENTOLIN HFA) 108 (90 Base) MCG/ACT inhaler Inhale 2 puffs into the lungs every 6 (six) hours as needed for wheezing or shortness of breath. 1 Inhaler 12  . Ascorbic Acid (VITAMIN C) 500 MG tablet Take 500 mg by mouth daily.      . B Complex Vitamins (VITAMIN B COMPLEX PO) Take by mouth.    Marland Kitchen BIOTENE DRY MOUTH (BIOTENE) LIQD Place 1 application onto teeth as needed.      . budesonide-formoterol (SYMBICORT) 160-4.5 MCG/ACT inhaler INHALE 2 PUFFS INTO THE LUNGS TWICE DAILY. RINSE MOUTH AFTER USE 10.2 g 12  . Cholecalciferol (VITAMIN D) 2000 units CAPS Take by mouth.    . clotrimazole-betamethasone (LOTRISONE) cream Apply topically 2 (two) times daily. (Patient taking differently: Apply topically 2 (two) times daily. PRN) 30 g prn  . diclofenac sodium (VOLTAREN) 1 % GEL 3 grams to 3 large joints up to 3 times daily 3 Tube 3  . Digestive Enzymes (DIGESTIVE ENZYME PO) Take by mouth.    . fluticasone (FLONASE) 50 MCG/ACT nasal spray SHAKE AND USE 2 SPRAYS IN EACH NOSTRIL DAILY 16 g 5  . folic acid (FOLVITE) 1 MG tablet Take 2 tablets (2 mg total) by mouth daily. 180 tablet 4  . hydroxychloroquine (  PLAQUENIL) 200 MG tablet Take 1.5 tablets (300 mg total) by mouth daily. 45 tablet 2  . lactase (LACTAID) 3000 UNITS tablet Take by mouth as needed.     Marland Kitchen lisinopril (PRINIVIL,ZESTRIL) 5 MG tablet Take 5 mg by mouth daily.    Marland Kitchen loratadine (CLARITIN) 10 MG tablet Take 10 mg by mouth daily.      . methotrexate (RHEUMATREX) 2.5 MG tablet TAKE 5 TABLETS BY MOUTH 1 TIME A WEEK 60 tablet 0  . montelukast (SINGULAIR) 10 MG tablet TAKE 1 TABLET BY MOUTH DAILY 30 tablet 12  . mupirocin ointment (BACTROBAN) 2 % APPLY EXTERNALLY TO THE AFFECTED AREA TWICE DAILY AS DIRECTED 22 g 12  . NON FORMULARY Doterra Essential Oils:  Eucalyptus, [place],  [place], [place], [place], [place] [employer]    . ondansetron (ZOFRAN) 4 MG tablet Take 1 tablet (4 mg total) by mouth every 6 (six) hours as needed. for nausea 30 tablet 0  . phenylephrine (SUDAFED PE) 10 MG TABS tablet Take 10 mg by mouth every 4 (four) hours as needed.    Marland Kitchen Spacer/Aero-Holding Chambers (AEROCHAMBER MV) inhaler Use as instructed 1 each 0  . Specialty Vitamins Products (CENTRUM PERFORMANCE) TABS Take 1 tablet by mouth daily.      Marland Kitchen SYNTHROID 75 MCG tablet daily.    . modafinil (PROVIGIL) 200 MG tablet Take 1 tablet (200 mg total) by mouth daily. (Patient taking differently: Take 200 mg by mouth daily. "taking half") 30 tablet 5   No facility-administered medications prior to visit.    PAST MEDICAL HISTORY: Past Medical History:  Diagnosis Date  . Allergic rhinitis    skin test 01-18-10  . Bronchitis   . Lupus (systemic lupus erythematosus) (HCC)    joints and skin rash. Dr. Estanislado Pandy  . Melanoma ([place])   . Rhinosinusitis   . Skin cancer     PAST SURGICAL HISTORY: Past Surgical History:  Procedure Laterality Date  . ABDOMINAL HYSTERECTOMY    . ACHILLES TENDON REPAIR Right    Dr. Durward Fortes   . KNEE ARTHROPLASTY    . skin cancer extraction      FAMILY HISTORY: Family History  Problem Relation Age of Onset  . Allergies Father   . Heart disease Father        bacterial endocarditis  . Cancer Maternal Grandmother   . Hypothyroidism Sister   . Hypothyroidism Sister   . Hashimoto's thyroiditis Sister   . Hepatitis Son     SOCIAL HISTORY: Social History   Socioeconomic History  . Marital status: Married    Spouse name: Not on file  . Number of children: 3  . Years of education: Not on file  . Highest education level: Not on file  Occupational History  . 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 Smoking status: Never Smoker  . Smokeless tobacco: Never Used  Substance and Sexual Activity  . Alcohol use: Yes    Comment: 1 MONTHLY   . Drug use: No  . Sexual activity: Not on file  Other Topics Concern  . Not on file  Social History Narrative  . Not on file   Social Determinants of Health   Financial Resource Strain:   . Difficulty of Paying Living Expenses: Not on file  Food Insecurity:   . Worried About Charity fundraiser in the Last Year: Not on file  . Ran Out of Food in the Last Year: Not on file  Transportation Needs:   .  Lack of Transportation (Medical): Not on file  . Lack of Transportation (Non-Medical): Not on file  Physical Activity:   . Days of Exercise per Week: Not on file  . Minutes of Exercise per Session: Not on file  Stress:   . Feeling of Stress : Not on file  Social Connections:   . Frequency of Communication with Friends and Family: Not on file  . Frequency of Social Gatherings with Friends and Family: Not on file  . Attends Religious Services: Not on file  . Active Member of Clubs or Organizations: Not on file  . Attends Archivist Meetings: Not on file  . Marital Status: Not on file  Intimate Partner Violence:   . Fear of Current or Ex-Partner: Not on file  . Emotionally Abused: Not on file  . Physically Abused: Not on file  . Sexually Abused: Not on file   PHYSICAL EXAM  Vitals:   02/26/20 1346  BP: (!) 156/98  Pulse: 81  Temp: 97.9 F (36.6 C)  SpO2: 98%  Weight: 202 lb (91.6 kg)  Height: 5\' 3"  (1.6 m)   Body mass index is 35.78 kg/m.  Generalized: Well developed, in no acute distress, manual blood pressure was 140/90  Neurological examination  Mentation: Alert oriented to time, place, history taking. Follows all commands speech and language fluent Cranial nerve II-XII: Pupils were equal round reactive to light. Extraocular movements were full, visual field were full on confrontational test. Facial sensation and strength were normal. Head turning and shoulder shrug were normal and symmetric. Motor: The motor testing reveals 5 over 5 strength of all 4  extremities. Good symmetric motor tone is noted throughout.  Sensory: Sensory testing is intact to soft touch on all 4 extremities. No evidence of extinction is noted.  Coordination: Cerebellar testing reveals good finger-nose-finger and heel-to-shin bilaterally.  Gait and station: Gait is normal. Tandem gait is normal. Romberg is negative. No drift is seen.  Reflexes: Deep tendon reflexes are symmetric and normal bilaterally.   DIAGNOSTIC DATA (LABS, IMAGING, TESTING) - I reviewed patient records, labs, notes, testing and imaging myself where available.  Lab Results  Component Value Date   WBC 8.8 12/09/2019   HGB 15.0 12/09/2019   HCT 44.5 12/09/2019   MCV 87.1 12/09/2019   PLT 325 12/09/2019      Component Value Date/Time   NA 143 12/09/2019 1408   K 3.8 12/09/2019 1408   CL 103 12/09/2019 1408   CO2 28 12/09/2019 1408   GLUCOSE 87 12/09/2019 1408   BUN 15 12/09/2019 1408   CREATININE 0.73 12/09/2019 1408   CALCIUM 9.7 12/09/2019 1408   PROT 7.0 12/09/2019 1408   ALBUMIN 4.1 06/02/2017 0945   AST 26 12/09/2019 1408   ALT 16 12/09/2019 1408   ALKPHOS 82 06/02/2017 0945   BILITOT 0.3 12/09/2019 1408   GFRNONAA 90 12/09/2019 1408   GFRAA 104 12/09/2019 1408   No results found for: CHOL, HDL, LDLCALC, LDLDIRECT, TRIG, CHOLHDL No results found for: HGBA1C No results found for: VITAMINB12 No results found for: TSH  ASSESSMENT AND PLAN 59 y.o. year old female  has a past medical history of Allergic rhinitis, Bronchitis, Lupus (systemic lupus erythematosus) ([place]), Melanoma ([place]), Rhinosinusitis, and Skin cancer. here with:  1.  Idiopathic hypersomnia   Provigil 200 mg daily, seems to be too much, keeps her up till midnight. With 100 mg, she falls asleep at 6:30 pm on the couch.  We will try in between dose,  150 mg daily. ESS was 12.  I refilled the prescription for 3 months, she will send me a MyChart message let me know how she is doing.  I do think she would benefit from  weight loss and exercise as tolerated.  She will follow-up here in 1 year or sooner if needed.  I spent 15 minutes with the patient. 50% of this time was spent discussing her plan of care.  Butler Denmark, AGNP-C, DNP 02/26/2020, 2:58 PM [facility] [address] [place], [place] [address] [phone]

## 2020-02-26 ENCOUNTER — Encounter: Payer: Self-pay | Admitting: Neurology

## 2020-02-26 ENCOUNTER — Ambulatory Visit: Payer: BC Managed Care – PPO | Admitting: Neurology

## 2020-02-26 ENCOUNTER — Other Ambulatory Visit: Payer: Self-pay

## 2020-02-26 VITALS — BP 156/98 | HR 81 | Temp 97.9°F | Ht 63.0 in | Wt 202.0 lb

## 2020-02-26 DIAGNOSIS — G4719 Other hypersomnia: Secondary | ICD-10-CM

## 2020-02-26 MED ORDER — MODAFINIL 100 MG PO TABS: 150.0000 mg | ORAL_TABLET | Freq: Every day | ORAL | 3 refills | Status: DC

## 2020-02-26 NOTE — Patient Instructions (Signed)
Let's try increase in Provigil 150 mg daily, see how it works for you  Let me know how things are going  See you back in 1 year

## 2020-03-03 ENCOUNTER — Other Ambulatory Visit: Payer: Self-pay | Admitting: *Deleted

## 2020-03-03 DIAGNOSIS — M359 Systemic involvement of connective tissue, unspecified: Secondary | ICD-10-CM

## 2020-03-03 MED ORDER — HYDROXYCHLOROQUINE SULFATE 200 MG PO TABS: 300.0000 mg | ORAL_TABLET | Freq: Every day | ORAL | 0 refills | Status: DC

## 2020-03-03 NOTE — Telephone Encounter (Signed)
Refill request received via fax  Last Visit:12/04/19 Next Visit:04/08/20 Labs: 12/09/19 RBC count is borderline elevated. Rest of CBC WNL. CMP WNL PLQ eye exam:  12/07/18 WNL   Current Dose per last office note on 12/03/20: plaquenil 200 mg 1 tablet by mouth in the morning and 1/2 tablet in the evening.  For Autoimmune disease   Attempted to contact the patient and left message for patient to advise she is due to update PLQ eye exam.  Okay to refill 30 day supply PLQ?

## 2020-03-03 NOTE — Telephone Encounter (Signed)
ok 

## 2020-03-25 ENCOUNTER — Encounter: Payer: Self-pay | Admitting: Rheumatology

## 2020-03-31 NOTE — Progress Notes (Signed)
Office Visit Note  Patient: Misty Curtis             Date of Birth: 11/14/1961           MRN: [id number]             PCP: Philmore Pali, NP Referring: Alonna Buckler* Visit Date: 04/08/2020 Occupation: '@GUAROCC' @  Subjective:  Medication monitoring   History of Present Illness: Misty Curtis is a 59 y.o. female with history of autoimmune disease.  She is taking MTX 5 tablets po once weekly, folic acid 1 mg po daily, and plaquenil 200 mg 1 tablet in the morning and 1/2 tablet in the evening.  She denies any signs or symptoms of autoimmune disease flare recently.  Her symptoms have been stable.  She continues to experience recurrent oral ulcerations, sicca symptoms, Raynaud's in her feet, photosensitivity, and malar rash intermittently.  She has been using Biotene products, humidifier, and eyedrops for symptomatic relief of sicca symptoms.  She denies any increased joint pain or joint swelling.  She states that she has been noticing increased lower extremity edema and states that her left lower extremity has been more edematous since receiving her first Moderna vaccination 2 weeks ago.  She denies any calf pain at this time.  She states that she had methotrexate for 1 week after her vaccination and experiencing some nausea once she restarted.  She states that the nausea was tolerable when taking Zofran and has since resolved.  She continues to experience trochanter bursitis bilaterally.  She uses Voltaren gel topically at bedtime and has been able to sleep through the night most nights.  She states that her level of fatigue has improved since increasing the dose of Provigil.   Activities of Daily Living:  Patient reports morning stiffness for 10-15  minutes.   Patient Reports nocturnal pain.  Difficulty dressing/grooming: Denies Difficulty climbing stairs: Denies Difficulty getting out of chair: Denies Difficulty using hands for taps, buttons, cutlery, and/or writing: Reports  Review of  Systems  Constitutional: Negative for fatigue.  HENT: Positive for mouth sores and mouth dryness. Negative for nose dryness.   Eyes: Positive for dryness. Negative for pain and visual disturbance.  Respiratory: Negative for cough, hemoptysis, shortness of breath and difficulty breathing.   Cardiovascular: Positive for swelling in legs/feet (Left LE swelling). Negative for chest pain, palpitations and hypertension.  Gastrointestinal: Positive for diarrhea. Negative for blood in stool and constipation.  Endocrine: Negative for increased urination.  Genitourinary: Negative for painful urination.  Musculoskeletal: Positive for arthralgias, joint pain and morning stiffness. Negative for joint swelling, myalgias, muscle weakness, muscle tenderness and myalgias.  Skin: Positive for color change and sensitivity to sunlight. Negative for pallor, rash, hair loss, nodules/bumps, skin tightness and ulcers.  Allergic/Immunologic: Negative for susceptible to infections.  Neurological: Negative for dizziness, numbness, headaches and weakness.  Hematological: Negative for swollen glands.  Psychiatric/Behavioral: Negative for depressed mood and sleep disturbance. The patient is not nervous/anxious.     PMFS History:  Patient Active Problem List   Diagnosis Date Noted  . Heel pain, chronic, right 05/02/2019  . Thrush 06/11/2018  . Acquired hypothyroidism 12/08/2016  . Autoimmune disease ([place]) 12/06/2016  . Other fatigue 12/06/2016  . Raynaud's disease without gangrene 12/06/2016  . High risk medication use 12/06/2016  . Allergic asthma, mild intermittent, uncomplicated 40/98/1191  . History of hypertension 12/06/2016  . Excessive daytime sleepiness 07/26/2016  . Snoring 07/26/2016  . Laryngitis 11/10/2012  . Postnasal drip 05/02/2012  .  Acute sinusitis, unspecified 10/24/2011  . HYPERTENSION 12/14/2009  . BRONCHITIS 12/14/2009  . LUPUS 12/14/2009  . History of melanoma 12/14/2009  . Seasonal and  perennial allergic rhinitis 12/14/2009    Past Medical History:  Diagnosis Date  . Allergic rhinitis    skin test 01-18-10  . Bronchitis   . Lupus (systemic lupus erythematosus) (HCC)    joints and skin rash. Dr. Estanislado Pandy  . Melanoma ([place])   . Rhinosinusitis   . Skin cancer     Family History  Problem Relation Age of Onset  . Allergies Father   . Heart disease Father        bacterial endocarditis  . Cancer Maternal Grandmother   . Hypothyroidism Sister   . Hypothyroidism Sister   . Hashimoto's thyroiditis Sister   . Hepatitis Son    Past Surgical History:  Procedure Laterality Date  . ABDOMINAL HYSTERECTOMY    . ACHILLES TENDON REPAIR Right    Dr. Durward Fortes   . BREAST LUMPECTOMY Left    08/21/2019, 09/20/2019  . KNEE ARTHROPLASTY    . skin cancer extraction     Social History   Social History Narrative  . Not on file   Immunization History  Administered Date(s) Administered  . Influenza Split 09/19/2011, 09/12/2012, 09/02/2013  . Influenza-Unspecified 10/19/2014  . Pneumococcal Polysaccharide-23 08/19/2010     Objective: Vital Signs: BP (!) 156/90 (BP Location: Right Arm, Patient Position: Sitting, Cuff Size: Normal)   Pulse 71   Resp 17   Ht '5\' 3"'  (1.6 m)   Wt 206 lb 3.2 oz (93.5 kg)   BMI 36.53 kg/m    Physical Exam Vitals and nursing note reviewed.  Constitutional:      Appearance: She is well-developed.  HENT:     Head: Normocephalic and atraumatic.  Eyes:     Conjunctiva/sclera: Conjunctivae normal.  Pulmonary:     Effort: Pulmonary effort is normal.  Abdominal:     General: Bowel sounds are normal.     Palpations: Abdomen is soft.  Musculoskeletal:     Cervical back: Normal range of motion.  Lymphadenopathy:     Cervical: No cervical adenopathy.  Skin:    General: Skin is warm and dry.     Capillary Refill: Capillary refill takes less than 2 seconds.  Neurological:     Mental Status: She is alert and oriented to person, place, and time.    Psychiatric:        Behavior: Behavior normal.      Musculoskeletal Exam: C-spine, thoracic spine, lumbar spine good range of motion.  No midline spinal tenderness.  Shoulder joints, elbow joints, wrist joints, MCPs, PIPs and DIPs good range of motion with no synovitis.  She has complete fist formation bilaterally.  Hip joints have good range of motion with no discomfort.  Tenderness over bilateral trochanteric bursa noted.  Knee joints have good range of motion with no warmth or effusion.  Ankle joints have good range of motion no tenderness or inflammation.  Pitting edema noted bilaterally, left greater than right.  No calf tenderness noted on exam.  CDAI Exam: CDAI Score: -- Patient Global: --; Provider Global: -- Swollen: --; Tender: -- Joint Exam 04/08/2020   No joint exam has been documented for this visit   There is currently no information documented on the homunculus. Go to the Rheumatology activity and complete the homunculus joint exam.  Investigation: No additional findings.  Imaging: No results found.  Recent Labs: Lab Results  Component  Value Date   WBC 8.8 12/09/2019   HGB 15.0 12/09/2019   PLT 325 12/09/2019   NA 143 12/09/2019   K 3.8 12/09/2019   CL 103 12/09/2019   CO2 28 12/09/2019   GLUCOSE 87 12/09/2019   BUN 15 12/09/2019   CREATININE 0.73 12/09/2019   BILITOT 0.3 12/09/2019   ALKPHOS 82 06/02/2017   AST 26 12/09/2019   ALT 16 12/09/2019   PROT 7.0 12/09/2019   ALBUMIN 4.1 06/02/2017   CALCIUM 9.7 12/09/2019   GFRAA 104 12/09/2019    Speciality Comments: PLQ eye exam:  12/07/18 WNL at Dr Myna Hidalgo. Hulan Saas, OD Follow up in 1 year   Procedures:  No procedures performed Allergies: Latex, Neomycin-bacitracin zn-polymyx, Onion, and Strawberry extract   Assessment / Plan:     Visit Diagnoses: Autoimmune disease ([place]) - History of positive ANA, DS DNA, Raynauds, fatigue, oral ulcers, nasal ulcers, arthralgias,sicca: She has not had any signs or  symptoms of a systemic flare recently.  She is clinically doing well on Plaquenil 200 mg 1 tablet in the morning and half tablet in the evening, methotrexate 5 tablets by mouth once weekly and folic acid 1 mg by mouth daily.  She experiences intermittent symptoms of Raynaud's in her toes, recurrent oral ulcerations, chronic sicca symptoms, intermittent malar rashes and photosensitivity.  No digital ulcerations or signs of gangrene were noted.  No Malar rash was noted on exam today.  She has been using Biotene products, eyedrops, and a humidifier for symptomatic relief of sicca symptoms.  She has no synovitis on exam today.  She experiences arthralgias intermittently but has not noticed any joint swelling.  She will continue taking Plaquenil and methotrexate as prescribed.  She does not need any refills at this time.  We reviewed lab work from 12/09/2019 today in the office.  Double-stranded DNA negative, complements within normal limits, ESR within normal limits, UA normal, CMP and CBC within normal limits.  CBC and CMP will be drawn today to monitor for drug toxicity.  She was advised to notify us if she develops any new or worsening symptoms. She will follow-up in the office in 5 months.  High risk medication use - PLQ 200 mg 1 tablet in the morning and 1/2 tablet in the evening, MTX 5 tablets by mouth once a week, and folic acid 1 mg daily. eye exam: 12/07/18.  She had an updated Plaquenil eye exam 1 week ago.  She will call her ophthalmologist to have them fax results to our office.  CBC and CMP were within normal limits on 12/09/2019.  She is due to update lab work today.  Orders were released.  She will return for lab work in July and every 3 months to monitor for drug toxicity.- Plan: CBC with Differential/Platelet, COMPLETE METABOLIC PANEL WITH GFR  Raynaud's disease without gangrene: She experiences intermittent symptoms of Raynaud's in her toes.  She has not noticed any ulcerations.  We discussed the  importance of wearing thick socks and keeping her core body temperature warm.  Right carpal tunnel syndrome: She experiences intermittent symptoms of right carpal tunnel.  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  She was encouraged to wear her carpal tunnel night splint on a nightly basis.  Achilles tendinitis of right lower extremity - Resolved. She continues to follow up with Dr. Durward Fortes.   History of melanoma - She follows up with her dermatologist on a regular basis.   History of fatigue: Her  fatigue has improved since increasing the dose of Provigil.   Pitting edema: She has pitting edema bilaterally, L>R.  She wears compression stockings on a regular basis.  She was advised to follow-up with her PCP if the edema progresses.  Other medical conditions are listed as follows:   History of hypothyroidism  History of asthma  History of sleep apnea  History of hypertension  Orders: Orders Placed This Encounter  Procedures  . CBC with Differential/Platelet  . COMPLETE METABOLIC PANEL WITH GFR   No orders of the defined types were placed in this encounter.   Face-to-face time spent with patient was 30 minutes. Greater than 50% of time was spent in counseling and coordination of care.  Follow-Up Instructions: Return in about 5 months (around 09/08/2020) for Autoimmune Disease.   Ofilia Neas, PA-C  Note - This record has been created using Dragon software.  Chart creation errors have been sought, but may not always  have been located. Such creation errors do not reflect on  the standard of medical care.

## 2020-04-03 ENCOUNTER — Other Ambulatory Visit: Payer: Self-pay

## 2020-04-03 DIAGNOSIS — M359 Systemic involvement of connective tissue, unspecified: Secondary | ICD-10-CM

## 2020-04-03 MED ORDER — HYDROXYCHLOROQUINE SULFATE 200 MG PO TABS: 300.0000 mg | ORAL_TABLET | Freq: Every day | ORAL | 0 refills | Status: DC

## 2020-04-03 NOTE — Telephone Encounter (Signed)
Ok to refill 30 day supply.  Please advise patient to schedule an updated PLQ eye exam ASAP.

## 2020-04-03 NOTE — Telephone Encounter (Signed)
Refill request received via fax from [facility] in [place] for plaquenil.   Last Visit: 1216/2020 telemedicine  Next Visit: 04/08/2020 Labs: 12/09/2019 RBC count is borderline elevated. Rest of CBC WNL. CMP WNL.  Eye exam: 12/07/2018   Attempted to contact patient and left message on machine to advise patient we need updated plaquenil eye exam.  Okay to refill 30 day supply of plaquenil?

## 2020-04-03 NOTE — Telephone Encounter (Signed)
Left message on machine to advise patient to schedule an updated PLQ eye exam ASAP.

## 2020-04-08 ENCOUNTER — Encounter: Payer: Self-pay | Admitting: Physician Assistant

## 2020-04-08 ENCOUNTER — Ambulatory Visit: Payer: BC Managed Care – PPO | Admitting: Physician Assistant

## 2020-04-08 ENCOUNTER — Other Ambulatory Visit: Payer: Self-pay

## 2020-04-08 VITALS — BP 156/90 | HR 71 | Resp 17 | Ht 63.0 in | Wt 206.2 lb

## 2020-04-08 DIAGNOSIS — G5601 Carpal tunnel syndrome, right upper limb: Secondary | ICD-10-CM

## 2020-04-08 DIAGNOSIS — Z79899 Other long term (current) drug therapy: Secondary | ICD-10-CM | POA: Diagnosis not present

## 2020-04-08 DIAGNOSIS — Z8639 Personal history of other endocrine, nutritional and metabolic disease: Secondary | ICD-10-CM

## 2020-04-08 DIAGNOSIS — Z87898 Personal history of other specified conditions: Secondary | ICD-10-CM

## 2020-04-08 DIAGNOSIS — M359 Systemic involvement of connective tissue, unspecified: Secondary | ICD-10-CM

## 2020-04-08 DIAGNOSIS — Z85820 Personal history of malignant melanoma of skin: Secondary | ICD-10-CM

## 2020-04-08 DIAGNOSIS — Z8679 Personal history of other diseases of the circulatory system: Secondary | ICD-10-CM

## 2020-04-08 DIAGNOSIS — Z8669 Personal history of other diseases of the nervous system and sense organs: Secondary | ICD-10-CM

## 2020-04-08 DIAGNOSIS — Z8709 Personal history of other diseases of the respiratory system: Secondary | ICD-10-CM

## 2020-04-08 DIAGNOSIS — I7300 Raynaud's syndrome without gangrene: Secondary | ICD-10-CM | POA: Diagnosis not present

## 2020-04-08 DIAGNOSIS — R609 Edema, unspecified: Secondary | ICD-10-CM

## 2020-04-08 DIAGNOSIS — M7661 Achilles tendinitis, right leg: Secondary | ICD-10-CM

## 2020-04-08 LAB — CBC WITH DIFFERENTIAL/PLATELET
Absolute Monocytes: 662 cells/uL (ref 200–950)
Basophils Absolute: 52 cells/uL (ref 0–200)
Basophils Relative: 0.6 %
Eosinophils Absolute: 181 cells/uL (ref 15–500)
Eosinophils Relative: 2.1 %
HCT: 42.8 % (ref 35.0–45.0)
Hemoglobin: 14.7 g/dL (ref 11.7–15.5)
Lymphs Abs: 3087 cells/uL (ref 850–3900)
MCH: 30.6 pg (ref 27.0–33.0)
MCHC: 34.3 g/dL (ref 32.0–36.0)
MCV: 89.2 fL (ref 80.0–100.0)
MPV: 9.5 fL (ref 7.5–12.5)
Monocytes Relative: 7.7 %
Neutro Abs: 4618 cells/uL (ref 1500–7800)
Neutrophils Relative %: 53.7 %
Platelets: 328 10*3/uL (ref 140–400)
RBC: 4.8 10*6/uL (ref 3.80–5.10)
RDW: 13.7 % (ref 11.0–15.0)
Total Lymphocyte: 35.9 %
WBC: 8.6 10*3/uL (ref 3.8–10.8)

## 2020-04-08 LAB — COMPLETE METABOLIC PANEL WITH GFR
AG Ratio: 1.6 (calc) (ref 1.0–2.5)
ALT: 18 U/L (ref 6–29)
AST: 24 U/L (ref 10–35)
Albumin: 4.2 g/dL (ref 3.6–5.1)
Alkaline phosphatase (APISO): 94 U/L (ref 37–153)
BUN: 16 mg/dL (ref 7–25)
CO2: 28 mmol/L (ref 20–32)
Calcium: 9.7 mg/dL (ref 8.6–10.4)
Chloride: 104 mmol/L (ref 98–110)
Creat: 0.85 mg/dL (ref 0.50–0.99)
GFR, Est African American: 86 mL/min/{1.73_m2} (ref >=60)
GFR, Est Non African American: 74 mL/min/{1.73_m2} (ref >=60)
Globulin: 2.6 g/dL (calc) (ref 1.9–3.7)
Glucose, Bld: 97 mg/dL (ref 65–99)
Potassium: 3.8 mmol/L (ref 3.5–5.3)
Sodium: 141 mmol/L (ref 135–146)
Total Bilirubin: 0.3 mg/dL (ref 0.2–1.2)
Total Protein: 6.8 g/dL (ref 6.1–8.1)

## 2020-04-09 NOTE — Progress Notes (Signed)
CBC and CMP WNL

## 2020-04-11 ENCOUNTER — Encounter: Payer: Self-pay | Admitting: Neurology

## 2020-04-21 ENCOUNTER — Encounter (INDEPENDENT_AMBULATORY_CARE_PROVIDER_SITE_OTHER): Payer: Self-pay

## 2020-04-28 IMAGING — MR MRI OF THE RIGHT ANKLE WITHOUT CONTRAST
5 series · 40 of 40 positions shown · non-contrast
Comparison: None.

CLINICAL DATA: The patient suffered a twisting injury of the right
ankle 06/21/2018. Continued diffuse pain and weakness.

EXAM:
MRI OF THE RIGHT ANKLE WITHOUT CONTRAST
TECHNIQUE: Multiplanar, multisequence MR imaging of the ankle was performed. No
intravenous contrast was administered.

[Series 5: T2 fat-sat · axial · 3.0mm · 0.53mm/px · z∈[-130,+9]mm · 9 of 36 slices shown (1 of 2)]
[im 1/36]
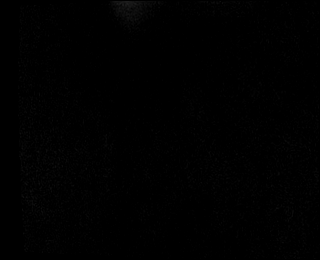
[im 5/36]
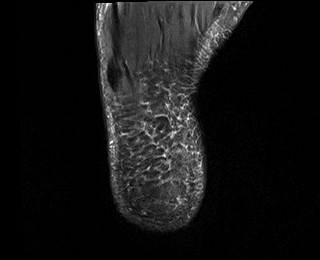
[im 9/36]
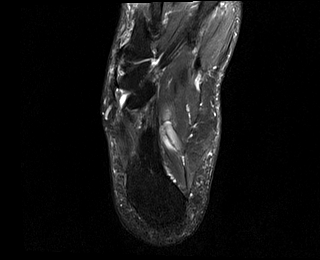
[im 14/36]
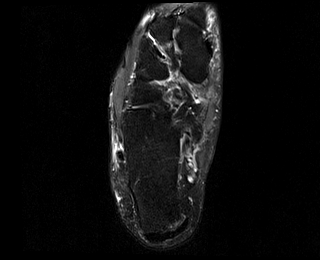
[im 18/36]
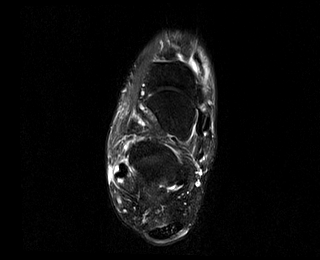
[im 22/36]
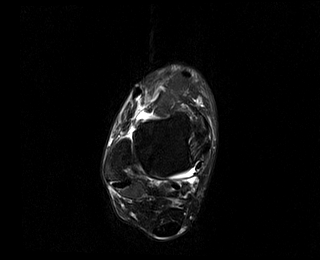
[im 27/36]
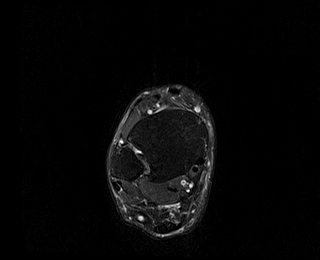
[im 31/36]
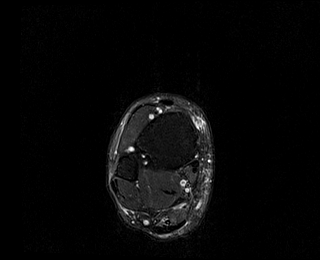
[im 36/36]
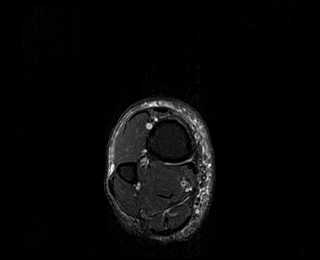

[Series 6: PD fat-sat · axial · 3.0mm · 0.56mm/px · z∈[-131,+9]mm · 10 of 36 slices shown]
[im 1/36]
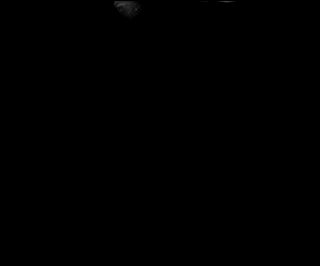
[im 4/36]
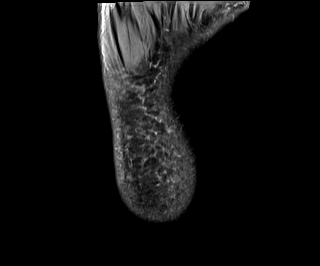
[im 8/36]
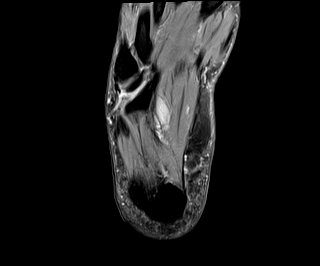
[im 12/36]
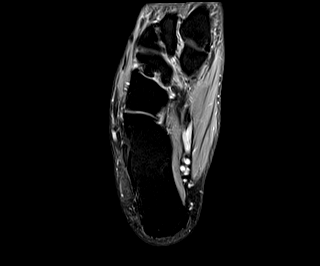
[im 16/36]
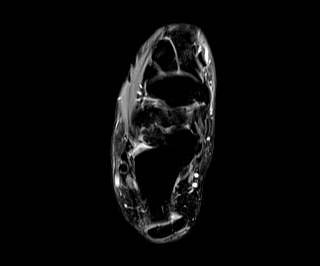
[im 20/36]
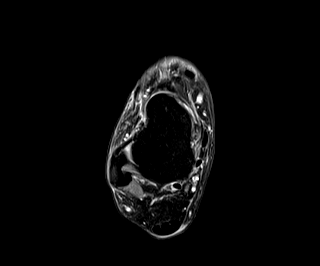
[im 24/36]
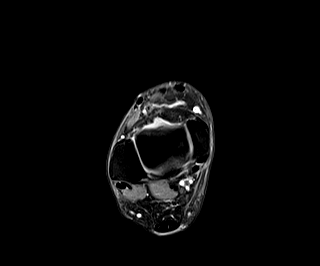
[im 28/36]
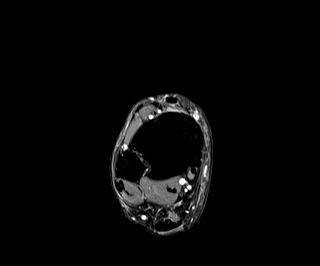
[im 32/36]
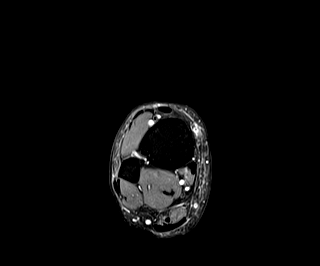
[im 36/36]
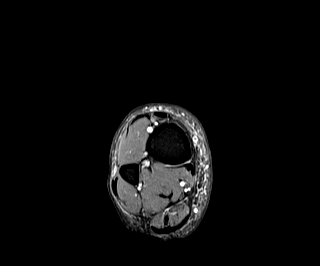

[Series 7: T2 fat-sat · coronal · 3.0mm · 0.62mm/px · 11 of 39 slices shown (2 of 2)]
[im 1/39]
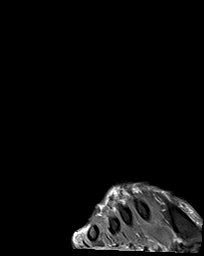
[im 4/39]
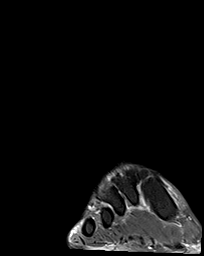
[im 8/39]
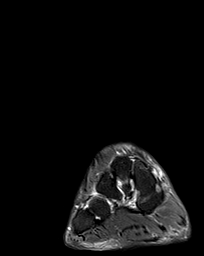
[im 12/39]
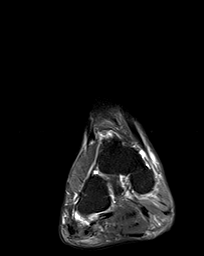
[im 16/39]
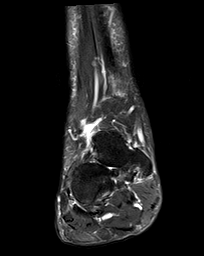
[im 20/39]
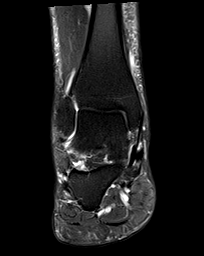
[im 23/39]
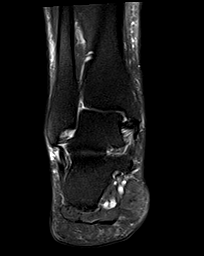
[im 27/39]
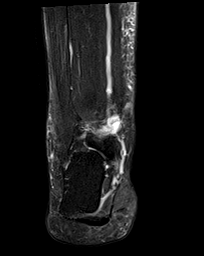
[im 31/39]
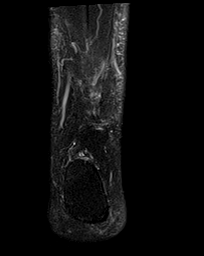
[im 35/39]
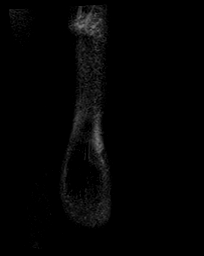
[im 39/39]
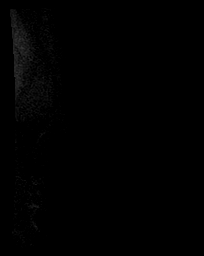

[Series 8: T1 · sagittal · 4.0mm · 0.56mm/px · 5 of 18 slices shown]
[im 1/18]
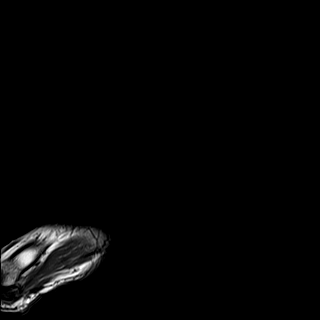
[im 5/18]
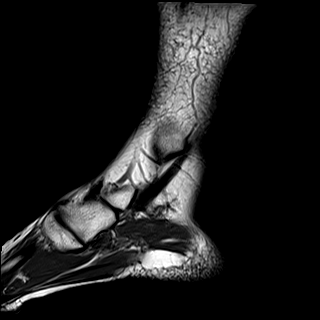
[im 9/18]
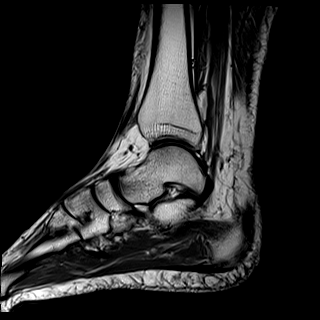
[im 13/18]
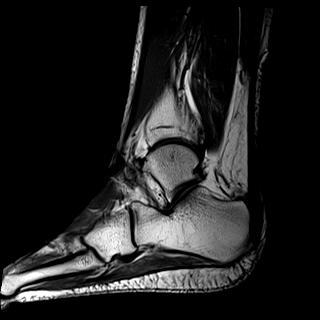
[im 18/18]
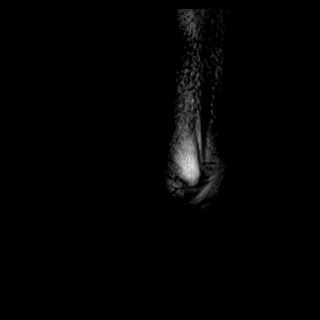

[Series 9: STIR · sagittal · 4.0mm · 0.70mm/px · 5 of 18 slices shown]
[im 1/18]
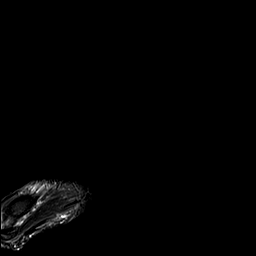
[im 5/18]
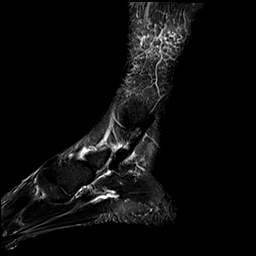
[im 9/18]
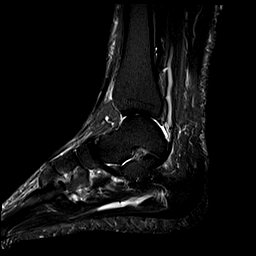
[im 13/18]
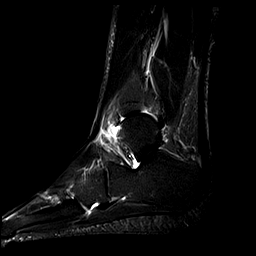
[im 18/18]
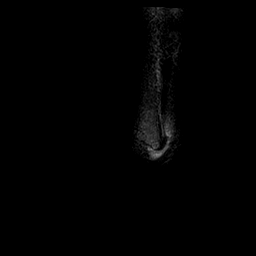

[40 of 40 positions shown; findings below may reference images not displayed]

FINDINGS: TENDONS

Peroneal: Intact.

Posteromedial: Intact.

Anterior: Intact.

Achilles: Intact. Mild intrasubstance increased T2 signal is seen in
the distal tendon. There is some fluid in the retrocalcaneal bursa.
Spurring off the dorsal process of the calcaneus is noted.

Plantar Fascia: Normal.

LIGAMENTS

Lateral: Intact.

Medial: Intact.

CARTILAGE

Ankle Joint: Normal.

Subtalar Joints/Sinus Tarsi: Normal.

Bones: No fracture or focal lesion.  No evidence of stress change.

Other: None.
IMPRESSION: Mild appearing Achilles tendinopathy without tear. Associated fluid
in the retrocalcaneal bursa is consistent with bursitis. There is
some spurring off the dorsal process of the calcaneus. The exam is
otherwise negative.

## 2020-04-30 ENCOUNTER — Other Ambulatory Visit: Payer: Self-pay | Admitting: *Deleted

## 2020-04-30 DIAGNOSIS — M359 Systemic involvement of connective tissue, unspecified: Secondary | ICD-10-CM

## 2020-04-30 MED ORDER — HYDROXYCHLOROQUINE SULFATE 200 MG PO TABS: ORAL_TABLET | ORAL | 0 refills | Status: DC

## 2020-04-30 NOTE — Telephone Encounter (Signed)
Refill request received via fax  Last Visit: 04/08/2020 Next Visit: 09/09/2020 Labs: 04/08/2020 WNL Eye exam: PLQ eye exam: 04/01/2020 WNL   Current Dose per office note 04/08/2020: PLQ 200 mg 1 tablet in the morning and 1/2 tablet in the evening,  Okay to refill per Dr.Deveshwar

## 2020-05-01 ENCOUNTER — Other Ambulatory Visit: Payer: Self-pay | Admitting: Internal Medicine

## 2020-05-01 ENCOUNTER — Other Ambulatory Visit: Payer: Self-pay | Admitting: *Deleted

## 2020-05-01 MED ORDER — METHOTREXATE 2.5 MG PO TABS: ORAL_TABLET | ORAL | 0 refills | Status: DC

## 2020-05-01 NOTE — Telephone Encounter (Signed)
Dr. Annamaria Boots, please advise if it is okay to refill med for pt or if this should be deferred to PCP.  Allergies  Allergen Reactions  . Latex Other (See Comments)  . Neomycin-Bacitracin Zn-Polymyx     REACTION: rash  . Onion     Also allergic to peppers and hot peppers and dairy   . Strawberry Extract      Current Outpatient Medications:  .  albuterol (PROVENTIL HFA;VENTOLIN HFA) 108 (90 Base) MCG/ACT inhaler, Inhale 2 puffs into the lungs every 6 (six) hours as needed for wheezing or shortness of breath., Disp: 1 Inhaler, Rfl: 12 .  Ascorbic Acid (VITAMIN C) 500 MG tablet, Take 500 mg by mouth daily.  , Disp: , Rfl:  .  B Complex Vitamins (VITAMIN B COMPLEX PO), Take by mouth., Disp: , Rfl:  .  BIOTENE DRY MOUTH (BIOTENE) LIQD, Place 1 application onto teeth as needed.  , Disp: , Rfl:  .  budesonide-formoterol (SYMBICORT) 160-4.5 MCG/ACT inhaler, INHALE 2 PUFFS INTO THE LUNGS TWICE DAILY. RINSE MOUTH AFTER USE, Disp: 10.2 g, Rfl: 12 .  Cholecalciferol (VITAMIN D) 2000 units CAPS, Take by mouth., Disp: , Rfl:  .  clotrimazole-betamethasone (LOTRISONE) cream, Apply topically 2 (two) times daily., Disp: 30 g, Rfl: prn .  diclofenac sodium (VOLTAREN) 1 % GEL, 3 grams to 3 large joints up to 3 times daily, Disp: 3 Tube, Rfl: 3 .  Digestive Enzymes (DIGESTIVE ENZYME PO), Take by mouth., Disp: , Rfl:  .  fluticasone (FLONASE) 50 MCG/ACT nasal spray, SHAKE AND USE 2 SPRAYS IN EACH NOSTRIL DAILY, Disp: 16 g, Rfl: 5 .  folic acid (FOLVITE) 1 MG tablet, Take 2 tablets (2 mg total) by mouth daily., Disp: 180 tablet, Rfl: 4 .  hydroxychloroquine (PLAQUENIL) 200 MG tablet, Take1 tablet po in the morning and 1/2 tablet po in the evening,, Disp: 135 tablet, Rfl: 0 .  lactase (LACTAID) 3000 UNITS tablet, Take by mouth as needed. , Disp: , Rfl:  .  lisinopril (PRINIVIL,ZESTRIL) 5 MG tablet, Take 5 mg by mouth daily., Disp: , Rfl:  .  lisinopril (ZESTRIL) 20 MG tablet, Take 10 mg by mouth every morning., Disp:  , Rfl:  .  loratadine (CLARITIN) 10 MG tablet, Take 10 mg by mouth daily.  , Disp: , Rfl:  .  methotrexate (RHEUMATREX) 2.5 MG tablet, TAKE 5 TABLETS BY MOUTH 1 TIME A WEEK, Disp: 60 tablet, Rfl: 0 .  modafinil (PROVIGIL) 100 MG tablet, Take 1.5 tablets (150 mg total) by mouth daily., Disp: 42 tablet, Rfl: 3 .  montelukast (SINGULAIR) 10 MG tablet, TAKE 1 TABLET BY MOUTH DAILY, Disp: 30 tablet, Rfl: 12 .  mupirocin ointment (BACTROBAN) 2 %, APPLY EXTERNALLY TO THE AFFECTED AREA TWICE DAILY AS DIRECTED, Disp: 22 g, Rfl: 12 .  NON FORMULARY, Doterra Essential Oils:  Eucalyptus, [place], [place]rass, [employer], [place], [employer], Disp: , Rfl:  .  ondansetron (ZOFRAN) 4 MG tablet, Take 1 tablet (4 mg total) by mouth every 6 (six) hours as needed. for nausea, Disp: 30 tablet, Rfl: 0 .  phenylephrine (SUDAFED PE) 10 MG TABS tablet, Take 10 mg by mouth every 4 (four) hours as needed., Disp: , Rfl:  .  Spacer/Aero-Holding Chambers (AEROCHAMBER MV) inhaler, Use as instructed, Disp: 1 each, Rfl: 0 .  Specialty Vitamins Products (CENTRUM PERFORMANCE) TABS, Take 1 tablet by mouth daily.  , Disp: , Rfl:  .  SYNTHROID 75 MCG tablet, daily., Disp: , Rfl:

## 2020-05-01 NOTE — Telephone Encounter (Signed)
Refill request received via fax  Last Visit: 04/08/2020 Next Visit: 09/09/2020 Labs: 04/08/2020 WNL  Current Dose per office note 04/08/2020: MTX 5 tablets by mouth once a week  Okay to refill per Dr. Estanislado Pandy

## 2020-05-01 NOTE — Telephone Encounter (Signed)
Medication refill e-went

## 2020-05-27 ENCOUNTER — Other Ambulatory Visit: Payer: Self-pay | Admitting: *Deleted

## 2020-05-27 MED ORDER — FLUTICASONE PROPIONATE 50 MCG/ACT NA SUSP: 2.0000 | Freq: Every day | NASAL | 5 refills | Status: DC

## 2020-06-08 ENCOUNTER — Other Ambulatory Visit: Payer: Self-pay | Admitting: Rheumatology

## 2020-06-09 ENCOUNTER — Ambulatory Visit: Payer: BC Managed Care – PPO | Admitting: Internal Medicine

## 2020-06-09 ENCOUNTER — Other Ambulatory Visit: Payer: Self-pay

## 2020-06-09 ENCOUNTER — Encounter: Payer: Self-pay | Admitting: Internal Medicine

## 2020-06-09 DIAGNOSIS — J4520 Mild intermittent asthma, uncomplicated: Secondary | ICD-10-CM | POA: Diagnosis not present

## 2020-06-09 DIAGNOSIS — J3089 Other allergic rhinitis: Secondary | ICD-10-CM | POA: Diagnosis not present

## 2020-06-09 DIAGNOSIS — J302 Other seasonal allergic rhinitis: Secondary | ICD-10-CM | POA: Diagnosis not present

## 2020-06-09 MED ORDER — BUDESONIDE-FORMOTEROL FUMARATE 160-4.5 MCG/ACT IN AERO: INHALATION_SPRAY | RESPIRATORY_TRACT | 12 refills | Status: DC

## 2020-06-09 MED ORDER — ONDANSETRON HCL 4 MG PO TABS: 4.0000 mg | ORAL_TABLET | Freq: Four times a day (QID) | ORAL | 0 refills | Status: DC | PRN

## 2020-06-09 NOTE — Progress Notes (Signed)
Patient ID: Misty Curtis, female    DOB: 02-Oct-1961, 59 y.o.   MRN: [id number]  HPI female never smoker followed for allergy, bronchitis, complicated by HBP, lupus, Somnolence( [place])  ---------------------------------------------------   06/10/2019-  58 yo female never smoker followed for allergy, bronchitis, complicated by HBP, lupus, Somnolence ([place]) -----pt has recent foot surgery (approx. 3-4 weeks ago), breathing at baseline, worse with pollen & weather changes Singulair, MTX, Symbicort 160, albuterol hfa Denies cough or hives/ angioedema from lisinopril Had surgery for ruptured achilles tendon. Using rescue inhaler 1X/ month. No sleep disturbance. Feels well controlled.  06/09/20- 59 yo female ([employer] person) never smoker followed for allergy, bronchitis, complicated by HBP, lupus, Somnolence ([place]), Hypothyroid,  Had 2 Moderna Covax -----doing well, good days and bad days. 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 Has used rescue 2x/ 11 months. Nasal stuffiness frequent, blows clear/ yellow- no HA, no blood. Uses flonase regularly.  Review of Systems-see HPI  + = positive Constitutional:   No-   weight loss, night sweats, fevers, chills, fatigue, lassitude. HEENT:   No-  headaches, difficulty swallowing, tooth/dental problems, sore throat,       No-  sneezing, itching, ear ache, +nasal congestion, +post nasal drip,  CV:  No-   chest pain, orthopnea, PND, swelling in lower extremities, anasarca, dizziness, palpitations Resp: No-   shortness of breath with exertion or at rest.              No-   productive cough,  No non-productive cough,  No- coughing up of blood.              No-   change in color of mucus.  Little wheezing.   Skin: +rash or lesions. GI:  No-   heartburn, indigestion, abdominal pain, nausea, vomiting,  GU:  MS:  No-   joint  pain or swelling.  Neuro-     nothing unusual Psych:  No- change in mood or affect. No depression or anxiety.  No memory loss.  Objective:   Physical Exam General- Alert, Oriented, Affect-appropriate, Distress- none acute, + overweight Skin- rash-none, lesions- none, excoriation- none Lymphadenopathy- none Head- atraumatic            Eyes- Gross vision intact, PERRLA, conjunctivae clear secretions            Ears- Hearing, canals-normal. TMs look normal            Nose- +turbinate hypertrophy, no-Septal dev, mucus, polyps, erosion, perforation             Throat- Mallampati II , mucosa + thrush, drainage- none, tonsils- atrophic.                    No visible postnasal drainage and minimal redness of her pharynx.                  Neck- flexible , trachea midline, no stridor , thyroid nl, carotid no bruit Chest - symmetrical excursion , unlabored           Heart/CV- RRR , no murmur , no gallop  , no rub, nl s1 s2                           - JVD- none , edema- none, stasis changes- none, varices- none  Lung- clear to P&A, wheeze- none, cough- none , dullness-none, rub- none           Chest wall-  Abd-  Br/ Gen/ Rectal- Not done, not indicated Extrem- +R foot in boot Neuro- grossly intact to observation

## 2020-06-09 NOTE — Assessment & Plan Note (Signed)
Adequate control. Infrequent need for rescue inhaler so not considering a triple inhaler or Biologic at this time.

## 2020-06-09 NOTE — Telephone Encounter (Signed)
Last Visit: 04/08/2020 Next Visit: 09/09/2020  Okay to refill per Dr. Estanislado Pandy

## 2020-06-09 NOTE — Assessment & Plan Note (Signed)
I don't see nasal polyps now, but turbinates are pretty full despite use of flonase.  Plan- try nasal strips, cautious use of Afrin. Consider ENT if not adequately controlled.

## 2020-06-09 NOTE — Patient Instructions (Signed)
Symbicort refill e-sent  Ok to continue current meds  For stuffy nose, continue flonase. You can also use a saline nasal spray, Breathe Right nasal strips and (sparingly) Afrin decongestant spray as discussed.  If you nose feels topped up most of the time, despite these measures, then suggest you see an ENT to see if there is more to do.   Please call if we can help

## 2020-06-15 ENCOUNTER — Telehealth: Payer: Self-pay

## 2020-06-15 MED ORDER — FLUTICASONE-SALMETEROL 250-50 MCG/DOSE IN AEPB: 1.0000 | INHALATION_SPRAY | Freq: Two times a day (BID) | RESPIRATORY_TRACT | 12 refills | Status: DC

## 2020-06-15 NOTE — Telephone Encounter (Signed)
LVM making patient aware of the change and letting her know the knew script is at the pharmacy.  Nothing further need.

## 2020-06-15 NOTE — Telephone Encounter (Signed)
Order Wixela 250   # 1, ref x 12     Inhale 1 puff, then rinse mouth, twice daily

## 2020-06-15 NOTE — Telephone Encounter (Signed)
Received Fax from [employer] the Symbicort 160 is not cover by the patients insurance. They state they will cover Breo, Wixela, Advair.      Dr. Annamaria Boots please advise.

## 2020-06-22 ENCOUNTER — Other Ambulatory Visit: Payer: Self-pay | Admitting: Internal Medicine

## 2020-07-16 ENCOUNTER — Other Ambulatory Visit: Payer: Self-pay | Admitting: *Deleted

## 2020-07-16 ENCOUNTER — Encounter: Payer: Self-pay | Admitting: Neurology

## 2020-07-16 MED ORDER — MODAFINIL 100 MG PO TABS: 150.0000 mg | ORAL_TABLET | Freq: Every day | ORAL | 3 refills | Status: DC

## 2020-07-16 NOTE — Telephone Encounter (Signed)
Last seen 4-24-2 SS/NP.  Renewed 02-26-20 #42 with 3 refills.  druge registry not checked no access.

## 2020-07-20 MED ORDER — MODAFINIL 100 MG PO TABS: 150.0000 mg | ORAL_TABLET | Freq: Every day | ORAL | 3 refills | Status: DC

## 2020-07-20 NOTE — Telephone Encounter (Signed)
I called [facility], did not get prescription will forward to SS/NP for signature.  Drug registry not checked as RNs cannot get in.  Dr. Jaynee Eagles signed for this 07-16-20 but pharmacy stated did not receive.

## 2020-08-13 ENCOUNTER — Other Ambulatory Visit: Payer: Self-pay | Admitting: Rheumatology

## 2020-08-13 DIAGNOSIS — M359 Systemic involvement of connective tissue, unspecified: Secondary | ICD-10-CM

## 2020-08-13 NOTE — Telephone Encounter (Signed)
Last Visit:04/08/2020 Next Visit:09/09/2020 Labs: 04/08/2020 WNL  Current Dose per office note on 04/08/2020:MTX 5 tablets by mouth once a week DX: Autoimmune disease   Left message to advise patient she is due to update labs.  Okay to refill 30 day supply MTX?

## 2020-08-14 MED ORDER — HYDROXYCHLOROQUINE SULFATE 200 MG PO TABS: ORAL_TABLET | ORAL | 0 refills | Status: DC

## 2020-08-14 NOTE — Telephone Encounter (Signed)
Last Visit:04/08/2020 Next Visit:09/09/2020 Labs: 04/08/2020 WNL PLQ eye exam: 04/01/2020 WNL  Current Dose per office note on 04/08/2020: PLQ 200 mg 1 tablet in the morning and 1/2 tablet in the evening,  DX: Autoimmune disease   Okay to refill per Dr. Estanislado Pandy

## 2020-08-19 ENCOUNTER — Other Ambulatory Visit: Payer: Self-pay

## 2020-08-19 ENCOUNTER — Ambulatory Visit: Payer: Self-pay

## 2020-08-19 ENCOUNTER — Ambulatory Visit: Payer: BC Managed Care – PPO | Admitting: Orthopaedic Surgery

## 2020-08-19 ENCOUNTER — Encounter: Payer: Self-pay | Admitting: Orthopaedic Surgery

## 2020-08-19 VITALS — Ht 63.0 in | Wt 200.0 lb

## 2020-08-19 DIAGNOSIS — M7662 Achilles tendinitis, left leg: Secondary | ICD-10-CM | POA: Diagnosis not present

## 2020-08-19 DIAGNOSIS — M25572 Pain in left ankle and joints of left foot: Secondary | ICD-10-CM

## 2020-08-19 NOTE — Progress Notes (Signed)
Office Visit Note   Patient: Misty Curtis           Date of Birth: May 28, 1961           MRN: [id number] Visit Date: 08/19/2020              Requested by: Misty Pali, NP [address] [place],  [place] [address] PCP: Misty Pali, NP   Assessment & Plan: Visit Diagnoses:  1. Pain in left ankle and joints of left foot   2. Achilles tendinitis, left leg     Plan: Insertional Achilles tendinitis left heel.  Misty Curtis is over a year status post surgical exploration of a similar problem for her right heel and doing quite well.  I suspect she has the same issue on the left that she does on the right but x-rays are normal.  She did have some ectopic calcification in the distal Achilles on her right foot films with evidence of Achilles tendinitis on her MRI scan.  We have discussed use of the equalizer boot and use of Voltaren gel and stretching exercises before we consider an MRI scan or further surgery and she was fine with that and also having some numbness over only her little toe and not in the distribution of the peroneal nerve.  I think just avoiding pressure on the toe from her shoes and wearing the boot or sandals would be helpful.  We will review that in the future as well  Follow-Up Instructions: Return if symptoms worsen or fail to improve.   Orders:  Orders Placed This Encounter  Procedures   XR Ankle 2 Views Left   No orders of the defined types were placed in this encounter.     Procedures: No procedures performed   Clinical Data: No additional findings.   Subjective: Chief Complaint  Patient presents with   Left Foot - Pain  Patient presents today for her left ankle/foot. She has been having pain the posterior aspect of her ankle for over a month. No known injury. She states that her lower leg is swollen.She has been applying Voltaren gel and that helps for a while. She gets relief with rest, but has a hard time finding a comfortable position at night. She had  Achilles pain in the right side before and had to have surgery in May of 2020.   HPI  Review of Systems   Objective: Vital Signs: Ht 5\' 3"  (1.6 m)    Wt 200 lb (90.7 kg)    BMI 35.43 kg/m   Physical Exam Constitutional:      Appearance: She is well-developed.  Eyes:     Pupils: Pupils are equal, round, and reactive to light.  Pulmonary:     Effort: Pulmonary effort is normal.  Skin:    General: Skin is warm and dry.  Neurological:     Mental Status: She is alert and oriented to person, place, and time.  Psychiatric:        Behavior: Behavior normal.     Ortho Exam left heel with some tenderness at the Achilles insertion both medially and laterally.  No redness or swelling.  Skin intact.  No pain or mass formation along the Achilles tendon.  Has normal dorsiflexion plantarflexion of her foot.  No plantar pain.  No pain along either malleolar I or behind the medial lateral malleolus.  I thought the Achilles was a little tight  Specialty Comments:  No specialty comments available.  Imaging:  XR Ankle 2 Views Left  Result Date: 08/19/2020 AP and lateral of the left ankle were obtained.  Patient is experiencing pain at the insertion of the Achilles tendon and.  No abnormalities identified i.e. fracture or ectopic calcification.  Some prominence of Boehler's angle.    PMFS History: Patient Active Problem List   Diagnosis Date Noted   Achilles tendinitis, left leg 08/19/2020   Heel pain, chronic, right 05/02/2019   Thrush 06/11/2018   Acquired hypothyroidism 12/08/2016   Autoimmune disease ([place]) 12/06/2016   Other fatigue 12/06/2016   Raynaud's disease without gangrene 12/06/2016   High risk medication use 12/06/2016   Allergic asthma, mild intermittent, uncomplicated 15/40/0867   History of hypertension 12/06/2016   Excessive daytime sleepiness 07/26/2016   Snoring 07/26/2016   Laryngitis 11/10/2012   Postnasal drip 05/02/2012   Acute sinusitis,  unspecified 10/24/2011   HYPERTENSION 12/14/2009   BRONCHITIS 12/14/2009   LUPUS 12/14/2009   History of melanoma 12/14/2009   Seasonal and perennial allergic rhinitis 12/14/2009   Past Medical History:  Diagnosis Date   Allergic rhinitis    skin test 01-18-10   Bronchitis    Lupus (systemic lupus erythematosus) (HCC)    joints and skin rash. Dr. Estanislado Pandy   [facility])    Rhinosinusitis    Skin cancer     Family History  Problem Relation Age of Onset   Allergies Father    Heart disease Father        bacterial endocarditis   Cancer Maternal Grandmother    Hypothyroidism Sister    Hypothyroidism Sister    Hashimoto's thyroiditis Sister    Hepatitis Son     Past Surgical History:  Procedure Laterality Date   ABDOMINAL HYSTERECTOMY     ACHILLES TENDON REPAIR Right    Dr. Durward Fortes    BREAST LUMPECTOMY Left    08/21/2019, 09/20/2019   KNEE ARTHROPLASTY     skin cancer extraction     Social History   Occupational History   Occupation: special ed [employer]  Tobacco Use   Smoking status: Never Smoker   Smokeless tobacco: Never Used  Scientific laboratory technician Use: Never used  Substance and Sexual Activity   Alcohol use: Yes    Comment: 1 MONTHLY   Drug use: No   Sexual activity: Not on file

## 2020-08-27 NOTE — Progress Notes (Deleted)
Office Visit Note  Patient: Misty Curtis             Date of Birth: 22-Jun-1961           MRN: [id number]             PCP: Philmore Pali, NP Referring: Philmore Pali, NP Visit Date: 09/09/2020 Occupation: @GUAROCC @  Subjective:  No chief complaint on file.   History of Present Illness: Misty Curtis is a 59 y.o. female ***   Activities of Daily Living:  Patient reports morning stiffness for *** {minute/hour:19697}.   Patient {ACTIONS;DENIES/REPORTS:21021675::"Denies"} nocturnal pain.  Difficulty dressing/grooming: {ACTIONS;DENIES/REPORTS:21021675::"Denies"} Difficulty climbing stairs: {ACTIONS;DENIES/REPORTS:21021675::"Denies"} Difficulty getting out of chair: {ACTIONS;DENIES/REPORTS:21021675::"Denies"} Difficulty using hands for taps, buttons, cutlery, and/or writing: {ACTIONS;DENIES/REPORTS:21021675::"Denies"}  No Rheumatology ROS completed.   PMFS History:  Patient Active Problem List   Diagnosis Date Noted  . Achilles tendinitis, left leg 08/19/2020  . Heel pain, chronic, right 05/02/2019  . Thrush 06/11/2018  . Acquired hypothyroidism 12/08/2016  . Autoimmune disease ([place]) 12/06/2016  . Other fatigue 12/06/2016  . Raynaud's disease without gangrene 12/06/2016  . High risk medication use 12/06/2016  . Allergic asthma, mild intermittent, uncomplicated 02/58/5277  . History of hypertension 12/06/2016  . Excessive daytime sleepiness 07/26/2016  . Snoring 07/26/2016  . Laryngitis 11/10/2012  . Postnasal drip 05/02/2012  . Acute sinusitis, unspecified 10/24/2011  . HYPERTENSION 12/14/2009  . BRONCHITIS 12/14/2009  . LUPUS 12/14/2009  . History of melanoma 12/14/2009  . Seasonal and perennial allergic rhinitis 12/14/2009    Past Medical History:  Diagnosis Date  . Allergic rhinitis    skin test 01-18-10  . Bronchitis   . Lupus (systemic lupus erythematosus) (HCC)    joints and skin rash. Dr. Estanislado Pandy  . Melanoma ([place])   . Rhinosinusitis   . Skin cancer     Family  History  Problem Relation Age of Onset  . Allergies Father   . Heart disease Father        bacterial endocarditis  . Cancer Maternal Grandmother   . Hypothyroidism Sister   . Hypothyroidism Sister   . Hashimoto's thyroiditis Sister   . Hepatitis Son    Past Surgical History:  Procedure Laterality Date  . ABDOMINAL HYSTERECTOMY    . ACHILLES TENDON REPAIR Right    Dr. Durward Fortes   . BREAST LUMPECTOMY Left    08/21/2019, 09/20/2019  . KNEE ARTHROPLASTY    . skin cancer extraction     Social History   Social History Narrative  . Not on file   Immunization History  Administered Date(s) Administered  . Influenza Split 09/19/2011, 09/12/2012, 09/02/2013  . Influenza-Unspecified 10/19/2014  . Moderna SARS-COVID-2 Vaccination 02/26/2020, 03/25/2020  . Pneumococcal Polysaccharide-23 08/19/2010  . Pneumococcal-Unspecified 07/01/2020     Objective: Vital Signs: There were no vitals taken for this visit.   Physical Exam   Musculoskeletal Exam: ***  CDAI Exam: CDAI Score: -- Patient Global: --; Provider Global: -- Swollen: --; Tender: -- Joint Exam 09/09/2020   No joint exam has been documented for this visit   There is currently no information documented on the homunculus. Go to the Rheumatology activity and complete the homunculus joint exam.  Investigation: No additional findings.  Imaging: XR Ankle 2 Views Left  Result Date: 08/19/2020 AP and lateral of the left ankle were obtained.  Patient is experiencing pain at the insertion of the Achilles tendon and.  No abnormalities identified i.e. fracture or ectopic calcification.  Some prominence of Boehler's angle.  Recent Labs: Lab Results  Component Value Date   WBC 8.6 04/08/2020   HGB 14.7 04/08/2020   PLT 328 04/08/2020   NA 141 04/08/2020   K 3.8 04/08/2020   CL 104 04/08/2020   CO2 28 04/08/2020   GLUCOSE 97 04/08/2020   BUN 16 04/08/2020   CREATININE 0.85 04/08/2020   BILITOT 0.3 04/08/2020   ALKPHOS  82 06/02/2017   AST 24 04/08/2020   ALT 18 04/08/2020   PROT 6.8 04/08/2020   ALBUMIN 4.1 06/02/2017   CALCIUM 9.7 04/08/2020   GFRAA 86 04/08/2020    Speciality Comments: PLQ eye exam: 04/01/2020 WNL at Dr Myna Hidalgo. Hulan Saas, OD Follow up in 1 year  Procedures:  No procedures performed Allergies: Latex, Lactose intolerance (gi), Milk-related compounds, Neomycin-bacitracin zn-polymyx, Onion, and Strawberry extract   Assessment / Plan:     Visit Diagnoses: No diagnosis found.  Orders: No orders of the defined types were placed in this encounter.  No orders of the defined types were placed in this encounter.   Face-to-face time spent with patient was *** minutes. Greater than 50% of time was spent in counseling and coordination of care.  Follow-Up Instructions: No follow-ups on file.   Earnestine Mealing, CMA  Note - This record has been created using Editor, commissioning.  Chart creation errors have been sought, but may not always  have been located. Such creation errors do not reflect on  the standard of medical care.

## 2020-08-28 ENCOUNTER — Telehealth: Payer: Self-pay | Admitting: Orthopaedic Surgery

## 2020-08-28 NOTE — Telephone Encounter (Signed)
Patient called with updates with boot wear. Patient states pains still persist without boots. Patient is asking to have surgery Oct 28th if possible. Please call patient at [phone].

## 2020-08-28 NOTE — Telephone Encounter (Signed)
Needs an MRI of left heel before scheduling surgery

## 2020-08-28 NOTE — Telephone Encounter (Signed)
Please see below.

## 2020-08-31 ENCOUNTER — Other Ambulatory Visit: Payer: Self-pay

## 2020-08-31 DIAGNOSIS — M7662 Achilles tendinitis, left leg: Secondary | ICD-10-CM

## 2020-08-31 DIAGNOSIS — M25572 Pain in left ankle and joints of left foot: Secondary | ICD-10-CM

## 2020-08-31 NOTE — Telephone Encounter (Signed)
Called patient and left message on voicemail that Dr.Whitfield wants MRI done first. It has been ordered.

## 2020-09-01 ENCOUNTER — Telehealth: Payer: Self-pay | Admitting: Internal Medicine

## 2020-09-01 ENCOUNTER — Other Ambulatory Visit: Payer: Self-pay | Admitting: *Deleted

## 2020-09-01 DIAGNOSIS — Z79899 Other long term (current) drug therapy: Secondary | ICD-10-CM

## 2020-09-01 LAB — CBC WITH DIFFERENTIAL/PLATELET
Absolute Monocytes: 720 cells/uL (ref 200–950)
Basophils Absolute: 58 cells/uL (ref 0–200)
Basophils Relative: 0.6 %
Eosinophils Absolute: 173 cells/uL (ref 15–500)
Eosinophils Relative: 1.8 %
HCT: 41.5 % (ref 35.0–45.0)
Hemoglobin: 14.3 g/dL (ref 11.7–15.5)
Lymphs Abs: 2966 cells/uL (ref 850–3900)
MCH: 31 pg (ref 27.0–33.0)
MCHC: 34.5 g/dL (ref 32.0–36.0)
MCV: 90 fL (ref 80.0–100.0)
MPV: 9.8 fL (ref 7.5–12.5)
Monocytes Relative: 7.5 %
Neutro Abs: 5683 cells/uL (ref 1500–7800)
Neutrophils Relative %: 59.2 %
Platelets: 319 10*3/uL (ref 140–400)
RBC: 4.61 10*6/uL (ref 3.80–5.10)
RDW: 13.3 % (ref 11.0–15.0)
Total Lymphocyte: 30.9 %
WBC: 9.6 10*3/uL (ref 3.8–10.8)

## 2020-09-01 LAB — COMPLETE METABOLIC PANEL WITH GFR
AG Ratio: 1.7 (calc) (ref 1.0–2.5)
ALT: 16 U/L (ref 6–29)
AST: 22 U/L (ref 10–35)
Albumin: 4.1 g/dL (ref 3.6–5.1)
Alkaline phosphatase (APISO): 88 U/L (ref 37–153)
BUN: 17 mg/dL (ref 7–25)
CO2: 25 mmol/L (ref 20–32)
Calcium: 9.4 mg/dL (ref 8.6–10.4)
Chloride: 104 mmol/L (ref 98–110)
Creat: 0.93 mg/dL (ref 0.50–0.99)
GFR, Est African American: 77 mL/min/{1.73_m2} (ref >=60)
GFR, Est Non African American: 67 mL/min/{1.73_m2} (ref >=60)
Globulin: 2.4 g/dL (calc) (ref 1.9–3.7)
Glucose, Bld: 109 mg/dL — ABNORMAL HIGH (ref 65–99)
Potassium: 3.8 mmol/L (ref 3.5–5.3)
Sodium: 140 mmol/L (ref 135–146)
Total Bilirubin: 0.3 mg/dL (ref 0.2–1.2)
Total Protein: 6.5 g/dL (ref 6.1–8.1)

## 2020-09-01 NOTE — Telephone Encounter (Signed)
Symbicort and Wixella are equivalent. She doesn't needd to be using both. DC Wixella and go back to Symbicort - inhale 2 puffs then rinse mouth, twice daily. If still getting hoarseness, let us know.

## 2020-09-01 NOTE — Telephone Encounter (Addendum)
Spoke with patient regarding prior message. Patient states inhaler is causing hoarseness Wixela. Patient stated this has been started 4 weeks ago. Patient does have her Symbicort.  Dr.Young can you please advise.  Thank you

## 2020-09-01 NOTE — Telephone Encounter (Signed)
ATC patient left a voicemail for patient to call our office back .

## 2020-09-02 NOTE — Telephone Encounter (Signed)
Left a message on patient's phone regarding prior message. Advised patient per Dr.Young can go back on Symbicort 2puffs twice daily . And stop Wixella and patient is to  contact our office if still having hoarseness. Nothing else further needed.

## 2020-09-02 NOTE — Progress Notes (Signed)
CBC and CMP are normal.

## 2020-09-04 NOTE — Telephone Encounter (Signed)
Dr. Young, please see mychart message sent by pt and advise. 

## 2020-09-04 NOTE — Telephone Encounter (Signed)
Which inhaler is blamed for affecting throat and which is better tolerated?

## 2020-09-09 ENCOUNTER — Telehealth: Payer: Self-pay | Admitting: Orthopaedic Surgery

## 2020-09-09 ENCOUNTER — Ambulatory Visit: Payer: BC Managed Care – PPO | Admitting: Physician Assistant

## 2020-09-09 NOTE — Telephone Encounter (Signed)
Called patient and left message with spouse for pt to call and set appt for MRI reading with Dr. Durward Fortes. Husband stated to reply message to patient.

## 2020-09-10 ENCOUNTER — Telehealth: Payer: Self-pay | Admitting: Orthopaedic Surgery

## 2020-09-10 NOTE — Telephone Encounter (Signed)
2x time calling patient to set appt for MRI results by Dr. Durward Fortes. Let a detailed vm. Will try again later.

## 2020-09-13 ENCOUNTER — Encounter: Payer: Self-pay | Admitting: Orthopaedic Surgery

## 2020-09-16 NOTE — Progress Notes (Signed)
Office Visit Note  Patient: Misty Curtis             Date of Birth: [birth year]           MRN: [id number]             PCP: Philmore Pali, NP Referring: Philmore Pali, NP Visit Date: 09/30/2020 Occupation: '@GUAROCC' @  Subjective:  Left heel pain  History of Present Illness: Misty Curtis is a 59 y.o. female with history of autoimmune disease.  She is taking Methotrexate 5 tablets by mouth once weekly, folic acid 1 mg po daily, and plaquenil 200 mg 1 tablet in the morning and half tablet in the evening. She is tolerating both medications without any side effects.  She has not missed any doses of MTX or PLQ recently.  She denies any recent flares.  She continues to have intermittent facial rashes, recurrent oral ulcers, sicca symptoms, and intermittent symptoms of Raynaud's. She continues to use biotene products for symptomatic relief.  She denies any digital ulcerations.  She denies any chest pain, palpitations, or shortness of breath.   She has persistent left heel pain and has an appointment this morning with Dr. Durward Fortes to discuss proceeding with achilles surgery. She states her right achilles is doing well after the successful surgery performed by Dr. Durward Fortes last year.  She denies any other joint pain or joint swelling at this time.     Activities of Daily Living:  Patient reports morning stiffness for 15-20 minutes.   Patient Reports nocturnal pain.  Difficulty dressing/grooming: Denies Difficulty climbing stairs: Denies Difficulty getting out of chair: Denies Difficulty using hands for taps, buttons, cutlery, and/or writing: Reports  Review of Systems  Constitutional: Positive for fatigue.  HENT: Positive for mouth sores and mouth dryness. Negative for nose dryness.   Eyes: Negative for pain, itching, visual disturbance and dryness.  Respiratory: Positive for cough. Negative for hemoptysis, shortness of breath and difficulty breathing.   Cardiovascular: Negative for chest pain,  palpitations, hypertension and swelling in legs/feet.  Gastrointestinal: Positive for diarrhea. Negative for abdominal pain, blood in stool and constipation.  Endocrine: Negative for increased urination.  Genitourinary: Negative for painful urination.  Musculoskeletal: Positive for arthralgias, joint pain, joint swelling, myalgias, morning stiffness and myalgias. Negative for muscle weakness and muscle tenderness.  Skin: Positive for color change. Negative for pallor, rash, hair loss, nodules/bumps, redness, skin tightness, ulcers and sensitivity to sunlight.  Allergic/Immunologic: Negative for susceptible to infections.  Neurological: Negative for dizziness, numbness, headaches, memory loss and weakness.  Hematological: Negative for swollen glands.  Psychiatric/Behavioral: Negative for depressed mood, confusion and sleep disturbance. The patient is not nervous/anxious.     PMFS History:  Patient Active Problem List   Diagnosis Date Noted  . Heel pain, chronic, left 09/30/2020  . Achilles tendinitis, left leg 08/19/2020  . Heel pain, chronic, right 05/02/2019  . Thrush 06/11/2018  . Acquired hypothyroidism 12/08/2016  . Autoimmune disease ([place]) 12/06/2016  . Other fatigue 12/06/2016  . Raynaud's disease without gangrene 12/06/2016  . High risk medication use 12/06/2016  . Allergic asthma, mild intermittent, uncomplicated 63/84/6659  . History of hypertension 12/06/2016  . Excessive daytime sleepiness 07/26/2016  . Snoring 07/26/2016  . Laryngitis 11/10/2012  . Postnasal drip 05/02/2012  . Acute sinusitis, unspecified 10/24/2011  . HYPERTENSION 12/14/2009  . BRONCHITIS 12/14/2009  . LUPUS 12/14/2009  . History of melanoma 12/14/2009  . Seasonal and perennial allergic rhinitis 12/14/2009    Past Medical History:  Diagnosis Date  . Allergic rhinitis    skin test 01-18-10  . Bronchitis   . Lupus (systemic lupus erythematosus) (HCC)    joints and skin rash. Dr. Estanislado Pandy  .  Melanoma ([place])   . Rhinosinusitis   . Skin cancer     Family History  Problem Relation Age of Onset  . Allergies Father   . Heart disease Father        bacterial endocarditis  . Cancer Maternal Grandmother   . Hypothyroidism Sister   . Hypothyroidism Sister   . Hashimoto's thyroiditis Sister   . Hepatitis Son    Past Surgical History:  Procedure Laterality Date  . ABDOMINAL HYSTERECTOMY    . ACHILLES TENDON REPAIR Right    Dr. Durward Fortes   . BREAST LUMPECTOMY Left    08/21/2019, 09/20/2019  . KNEE ARTHROPLASTY    . skin cancer extraction     Social History   Social History Narrative  . Not on file   Immunization History  Administered Date(s) Administered  . Influenza Split 09/19/2011, 09/12/2012, 09/02/2013  . Influenza-Unspecified 10/19/2014  . Moderna SARS-COVID-2 Vaccination 02/26/2020, 03/25/2020  . Pneumococcal Polysaccharide-23 08/19/2010  . Pneumococcal-Unspecified 07/01/2020     Objective: Vital Signs: BP (!) 152/77 (BP Location: Left Arm, Patient Position: Sitting, Cuff Size: Small)   Pulse 80   Ht '5\' 3"'  (1.6 m)   Wt 200 lb (90.7 kg) Comment: Per patient  BMI 35.43 kg/m    Physical Exam Vitals and nursing note reviewed.  Constitutional:      Appearance: She is well-developed.  HENT:     Head: Normocephalic and atraumatic.  Eyes:     Conjunctiva/sclera: Conjunctivae normal.  Pulmonary:     Effort: Pulmonary effort is normal.  Abdominal:     General: Bowel sounds are normal.     Palpations: Abdomen is soft.  Musculoskeletal:     Cervical back: Normal range of motion.  Skin:    General: Skin is warm and dry.     Capillary Refill: Capillary refill takes less than 2 seconds.  Neurological:     Mental Status: She is alert and oriented to person, place, and time.  Psychiatric:        Behavior: Behavior normal.      Musculoskeletal Exam: C-spine, thoracic spine, and lumbar spine good ROM.  Shoulder joints, elbow joints, wrist joints, MCPs, PIPs,  and DIPs good ROM with no synovitis.  Tenderness on the ulnar aspect of the right wrist.  Complete fist formation bilaterally.  Hip joints good ROM with no discomfort.  Knee joints good ROM with no warmth or effusion.  Tenderness along the left achilles tendon at base of the heel.  No tenderness of the right achilles tendon.  Pitting edema noted.   CDAI Exam: CDAI Score: -- Patient Global: --; Provider Global: -- Swollen: --; Tender: -- Joint Exam 09/30/2020   No joint exam has been documented for this visit   There is currently no information documented on the homunculus. Go to the Rheumatology activity and complete the homunculus joint exam.  Investigation: No additional findings.  Imaging: MR Ankle Left w/o contrast  Result Date: 09/28/2020 CLINICAL DATA:  Posterior left ankle pain for approximately 1 year. Pain is worse with walking. EXAM: MRI OF THE LEFT ANKLE WITHOUT CONTRAST TECHNIQUE: Multiplanar, multisequence MR imaging of the ankle was performed. No intravenous contrast was administered. COMPARISON:  Plain films left ankle 08/19/2020. FINDINGS: TENDONS Peroneal: Short segment prior hernia is brevis longitudinal split tearing  is seen as the tendon passes along the calcaneus. No complete tear. The peroneus longus is intact. Posteromedial: Intact. Anterior: Intact. Achilles: Intact. Small foci of increased T2 signal and thickening of the tendon are seen extending approximately 6 cm craniocaudal to a point just above the tendon insertion on the calcaneus. Plantar Fascia: Intact.  No evidence of plantar fasciitis. LIGAMENTS Lateral: Intact. Medial: Intact. CARTILAGE Ankle Joint: Normal. No joint effusion or osteochondral lesion of the talar dome. Subtalar Joints/Sinus Tarsi: Negative. Bones: No fracture, contusion or worrisome lesion. 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 Other: None. 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  Small appearing longitudinal split tear of the peroneus brevis as it passes along the calcaneus. 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 Electronically Signed   By: Inge Rise M.D.   On: 09/28/2020 13:40    Recent Labs: Lab Results  Component Value Date   WBC 9.6 09/01/2020   HGB 14.3 09/01/2020   PLT 319 09/01/2020   NA 140 09/01/2020   K 3.8 09/01/2020   CL 104 09/01/2020   CO2 25 09/01/2020   GLUCOSE 109 (H) 09/01/2020   BUN 17 09/01/2020   CREATININE 0.93 09/01/2020   BILITOT 0.3 09/01/2020   ALKPHOS 82 06/02/2017   AST 22 09/01/2020   ALT 16 09/01/2020   PROT 6.5 09/01/2020   ALBUMIN 4.1 06/02/2017   CALCIUM 9.4 09/01/2020   GFRAA 77 09/01/2020    Speciality Comments: PLQ eye exam: 04/01/2020 WNL at Dr Myna Hidalgo. Hulan Saas, OD Follow up in 1 year  Procedures:  No procedures performed Allergies: Latex, Lactose intolerance (gi), Milk-related compounds, Neomycin-bacitracin zn-polymyx, Onion, Strawberry extract, and Wixela inhub [fluticasone-salmeterol]   Assessment / Plan:     Visit Diagnoses: Autoimmune disease ([place]) - History of positive ANA, DS DNA, Raynauds, fatigue, oral ulcers, nasal ulcers, arthralgias, sicca: She has not had any signs or symptoms of an autoimmune disease flare recently.  She is clinically doing well on Plaquenil 200 mg 1 tablet in the morning and half tablet in the evening, methotrexate 5 tablets by mouth once weekly, folic acid 1 mg by mouth daily.  She has not missed any doses of Plaquenil or methotrexate recently.  She continues to experience intermittent facial rashes but tries to avoid direct sun exposure.  She has intermittent symptoms of Raynaud's in her toes but no digital ulcerations or signs of gangrene noted.  She has recurrent oral ulcerations as well as mouth dryness.  She has been using Biotene products for symptomatic relief.  Her level of fatigue has been stable overall.  She has no synovitis on examination today. 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  She was advised to notify us if her right wrist pain persists or worsens. Lab work from 12/09/2019 was reviewed with the patient today in the office: Double-stranded DNA negative, complements within normal limits, ESR within normal limits.  We will recheck the following autoimmune lab work today.  She will continue taking plaquenil 200 mg 1 tablet in the morning and 1/2 tablet in the evening, MTX 5 tablets by mouth once weekly, and folic acid 1 mg daily.  She does not need any refills at this time.  She was advised to notify us if she develops any signs or symptoms of a flare.  She will follow up in 5 months.- Plan: Urinalysis, Routine w reflex microscopic, Anti-DNA antibody, double-stranded,  C3 and C4, Sedimentation rate  High risk medication use - PLQ 200 mg 1 tablet in the morning and 1/2 tablet in the evening, MTX 5 tablets by mouth once a week, and folic acid 1 mg daily. PLQ eye exam: 04/01/2020.  CBC and CMP were drawn on 09/01/2020.  She will be due to update lab work in December and every 3 months to monitor for drug toxicity. Standing orders for CBC and CMP are in place. She is aware that she should hold MTX if she develops signs or symptoms of an infection and to resume once the infection has completely cleared.  She has received both covid-19 vaccine doses and was encouraged to receive the 3rd dose.  She was advised to avoid taking Tylenol or NSAIDs 24 hours prior to the third dose.  She should also hold methotrexate 1 week after receiving a third dose.  She was advised to notify us or PCP if she develops a COVID-19 infection in order to receive the antibody infusion.  She was encouraged to continue to wear a mask and social distance.  She voiced understanding.  Raynaud's disease without gangrene: She has intermittent symptoms of raynaud's in her toes.  No digital ulcerations or signs of  gangrene.  She was advised to notify us if she develops any new or worsening symptoms.   Right carpal tunnel syndrome: Asymptomatic at this time.  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  Tenderness along the ulnar aspect of the right wrist noted. She will continue to use her brace as needed.   Achilles tendinitis of right lower extremity - Resolved. She had successful insertional achilles surgery performed by Dr. Durward Fortes in the past. She has no tenderness or discomfort at this time.   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  She was evaluated by Dr. Durward Fortes today, and she will be proceeding with surgical debridement on 10/15/20. She will be immobilized for 4-6 weeks using a kneeling scooter.  She was advised to hold MTX 1-2 weeks before surgery and to be cleared by Dr. Durward Fortes prior to resuming.   Other medical conditions are listed as follows:   History of melanoma - She follows up with her dermatologist on a regular basis.  History of fatigue  History of hypothyroidism  History of sleep apnea  History of asthma  History of hypertension  Pitting edema  Orders: Orders Placed This Encounter  Procedures  . Urinalysis, Routine w reflex microscopic  . Anti-DNA antibody, double-stranded  . C3 and C4  . Sedimentation rate   No orders of the defined types were placed in this encounter.    Follow-Up Instructions: Return in about 5 months (around 02/28/2021) for Autoimmune Disease.   Ofilia Neas, PA-C  Note - This record has been created using Dragon software.  Chart creation errors have been sought, but may not always  have been located. Such creation errors do not reflect on  the standard of  medical care.

## 2020-09-27 ENCOUNTER — Ambulatory Visit
Admission: RE | Admit: 2020-09-27 | Discharge: 2020-09-27 | Disposition: A | Discharge status: Home / Self Care | Payer: BC Managed Care – PPO | Source: Ambulatory Visit | Attending: Orthopaedic Surgery | Admitting: Orthopaedic Surgery

## 2020-09-27 DIAGNOSIS — M25572 Pain in left ankle and joints of left foot: Secondary | ICD-10-CM

## 2020-09-27 DIAGNOSIS — M7662 Achilles tendinitis, left leg: Secondary | ICD-10-CM

## 2020-09-30 ENCOUNTER — Ambulatory Visit: Payer: BC Managed Care – PPO | Admitting: Physician Assistant

## 2020-09-30 ENCOUNTER — Ambulatory Visit: Payer: BC Managed Care – PPO | Admitting: Orthopaedic Surgery

## 2020-09-30 ENCOUNTER — Encounter: Payer: Self-pay | Admitting: Orthopaedic Surgery

## 2020-09-30 ENCOUNTER — Other Ambulatory Visit: Payer: Self-pay

## 2020-09-30 ENCOUNTER — Encounter: Payer: Self-pay | Admitting: Physician Assistant

## 2020-09-30 VITALS — BP 152/77 | HR 80 | Ht 63.0 in | Wt 200.0 lb

## 2020-09-30 DIAGNOSIS — Z8679 Personal history of other diseases of the circulatory system: Secondary | ICD-10-CM

## 2020-09-30 DIAGNOSIS — Z79899 Other long term (current) drug therapy: Secondary | ICD-10-CM | POA: Diagnosis not present

## 2020-09-30 DIAGNOSIS — I7300 Raynaud's syndrome without gangrene: Secondary | ICD-10-CM

## 2020-09-30 DIAGNOSIS — Z85820 Personal history of malignant melanoma of skin: Secondary | ICD-10-CM

## 2020-09-30 DIAGNOSIS — M7661 Achilles tendinitis, right leg: Secondary | ICD-10-CM

## 2020-09-30 DIAGNOSIS — M6788 Other specified disorders of synovium and tendon, other site: Secondary | ICD-10-CM

## 2020-09-30 DIAGNOSIS — G5601 Carpal tunnel syndrome, right upper limb: Secondary | ICD-10-CM | POA: Diagnosis not present

## 2020-09-30 DIAGNOSIS — G8929 Other chronic pain: Secondary | ICD-10-CM

## 2020-09-30 DIAGNOSIS — Z8639 Personal history of other endocrine, nutritional and metabolic disease: Secondary | ICD-10-CM

## 2020-09-30 DIAGNOSIS — Z8669 Personal history of other diseases of the nervous system and sense organs: Secondary | ICD-10-CM

## 2020-09-30 DIAGNOSIS — Z8709 Personal history of other diseases of the respiratory system: Secondary | ICD-10-CM

## 2020-09-30 DIAGNOSIS — M79672 Pain in left foot: Secondary | ICD-10-CM

## 2020-09-30 DIAGNOSIS — M359 Systemic involvement of connective tissue, unspecified: Secondary | ICD-10-CM | POA: Diagnosis not present

## 2020-09-30 DIAGNOSIS — R609 Edema, unspecified: Secondary | ICD-10-CM

## 2020-09-30 DIAGNOSIS — Z87898 Personal history of other specified conditions: Secondary | ICD-10-CM

## 2020-09-30 NOTE — Patient Instructions (Signed)
Hand Exercises Hand exercises can be helpful for almost anyone. These exercises can strengthen the hands, improve flexibility and movement, and increase blood flow to the hands. These results can make work and daily tasks easier. Hand exercises can be especially helpful for people who have joint pain from arthritis or have nerve damage from overuse (carpal tunnel syndrome). These exercises can also help people who have injured a hand. Exercises Most of these hand exercises are gentle stretching and motion exercises. It is usually safe to do them often throughout the day. Warming up your hands before exercise may help to reduce stiffness. You can do this with gentle massage or by placing your hands in warm water for 10-15 minutes. It is normal to feel some stretching, pulling, tightness, or mild discomfort as you begin new exercises. This will gradually improve. Stop an exercise right away if you feel sudden, severe pain or your pain gets worse. Ask your health care provider which exercises are best for you. Knuckle bend or "claw" fist 1. Stand or sit with your arm, hand, and all five fingers pointed straight up. Make sure to keep your wrist straight during the exercise. 2. Gently bend your fingers down toward your palm until the tips of your fingers are touching the top of your palm. Keep your big knuckle straight and just bend the small knuckles in your fingers. 3. Hold this position for __________ seconds. 4. Straighten (extend) your fingers back to the starting position. Repeat this exercise 5-10 times with each hand. Full finger fist 1. Stand or sit with your arm, hand, and all five fingers pointed straight up. Make sure to keep your wrist straight during the exercise. 2. Gently bend your fingers into your palm until the tips of your fingers are touching the middle of your palm. 3. Hold this position for __________ seconds. 4. Extend your fingers back to the starting position, stretching every  joint fully. Repeat this exercise 5-10 times with each hand. Straight fist 1. Stand or sit with your arm, hand, and all five fingers pointed straight up. Make sure to keep your wrist straight during the exercise. 2. Gently bend your fingers at the big knuckle, where your fingers meet your hand, and the middle knuckle. Keep the knuckle at the tips of your fingers straight and try to touch the bottom of your palm. 3. Hold this position for __________ seconds. 4. Extend your fingers back to the starting position, stretching every joint fully. Repeat this exercise 5-10 times with each hand. Tabletop 1. Stand or sit with your arm, hand, and all five fingers pointed straight up. Make sure to keep your wrist straight during the exercise. 2. Gently bend your fingers at the big knuckle, where your fingers meet your hand, as far down as you can while keeping the small knuckles in your fingers straight. Think of forming a tabletop with your fingers. 3. Hold this position for __________ seconds. 4. Extend your fingers back to the starting position, stretching every joint fully. Repeat this exercise 5-10 times with each hand. Finger spread 1. Place your hand flat on a table with your palm facing down. Make sure your wrist stays straight as you do this exercise. 2. Spread your fingers and thumb apart from each other as far as you can until you feel a gentle stretch. Hold this position for __________ seconds. 3. Bring your fingers and thumb tight together again. Hold this position for __________ seconds. Repeat this exercise 5-10 times with each hand.   Making circles 1. Stand or sit with your arm, hand, and all five fingers pointed straight up. Make sure to keep your wrist straight during the exercise. 2. Make a circle by touching the tip of your thumb to the tip of your index finger. 3. Hold for __________ seconds. Then open your hand wide. 4. Repeat this motion with your thumb and each finger on your  hand. Repeat this exercise 5-10 times with each hand. Thumb motion 1. Sit with your forearm resting on a table and your wrist straight. Your thumb should be facing up toward the ceiling. Keep your fingers relaxed as you move your thumb. 2. Lift your thumb up as high as you can toward the ceiling. Hold for __________ seconds. 3. Bend your thumb across your palm as far as you can, reaching the tip of your thumb for the small finger (pinkie) side of your palm. Hold for __________ seconds. Repeat this exercise 5-10 times with each hand. Grip strengthening  1. Hold a stress ball or other soft ball in the middle of your hand. 2. Slowly increase the pressure, squeezing the ball as much as you can without causing pain. Think of bringing the tips of your fingers into the middle of your palm. All of your finger joints should bend when doing this exercise. 3. Hold your squeeze for __________ seconds, then relax. Repeat this exercise 5-10 times with each hand. Contact a health care provider if:  Your hand pain or discomfort gets much worse when you do an exercise.  Your hand pain or discomfort does not improve within 2 hours after you exercise. If you have any of these problems, stop doing these exercises right away. Do not do them again unless your health care provider says that you can. Get help right away if:  You develop sudden, severe hand pain or swelling. If this happens, stop doing these exercises right away. Do not do them again unless your health care provider says that you can. This information is not intended to replace advice given to you by your health care provider. Make sure you discuss any questions you have with your health care provider. Document Revised: 03/28/2019 Document Reviewed: 12/06/2018 Elsevier Patient Education  2020 Elsevier Inc.  

## 2020-09-30 NOTE — Progress Notes (Signed)
Office Visit Note   Patient: Misty Curtis           Date of Birth: 12/12/1961           MRN: [id number] Visit Date: 09/30/2020              Requested by: Philmore Pali, NP [address] [place],  [place] [address] PCP: Philmore Pali, NP   Assessment & Plan: Visit Diagnoses:  1. Heel pain, chronic, left     Plan: Misty Curtis had an MRI scan of her left ankle.  This demonstrated noninsertional Achilles tendinosis without tear.  There was also a short segment tear of the peroneus brevis as it passed along the calcaneus.  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  He does have a history of lupus which could account for some of the arthritis.  Misty Curtis's pain is localized right at the attachment of the Achilles to the os calcis.  She has had successful insertional Achilles surgery on the opposite side.  She is not having any pain along the peroneus musculature behind the lateral malleolus and no significant discomfort at the calcaneocuboid joint.  After much discussion she like to proceed with the Achilles surgery.  This would involve exploring the distal attachment and debriding any abnormal tissue.  She be immobilized for probably 4 to 6 weeks similar to the opposite side.  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   Orders:  No orders of the defined types were placed in this encounter.  No orders of the defined types were placed in this encounter.     Procedures: No procedures performed   Clinical Data: No additional findings.   Subjective: Chief Complaint  Patient presents with  . Left Ankle - Follow-up    MRI review  Patient presents today for follow up on her left ankle. She had an MRI done on 09/27/2020 and is here today to review those  results.  HPI  Review of Systems   Objective: Vital Signs: Ht 5\' 3"  (1.6 m)   Wt 200 lb (90.7 kg)   BMI 35.43 kg/m   Physical Exam  Ortho Exam left heel and ankle with some tenderness directly at the insertion of the Achilles on the os calcis.  No thickening of the tendon.  Some mild discomfort with dorsiflexion of her foot.  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  She did not have any tenderness or swelling along the peroneus longus or brevis tendons behind the lateral malleolus or near its attachment laterally.  She did have a little discomfort over the calcaneocuboid joint consistent with the arthritis noted by film but not to the point where it was uncomfortable.  Sensory exam intact.  Good pulses  Specialty Comments:  No specialty comments available.  Imaging: No results found.   PMFS History: Patient Active Problem List   Diagnosis Date Noted  . Heel pain, chronic, left 09/30/2020  . Achilles tendinitis, left leg 08/19/2020  . Heel pain, chronic, right 05/02/2019  . Thrush 06/11/2018  . Acquired hypothyroidism 12/08/2016  . Autoimmune disease ([place]) 12/06/2016  . Other fatigue 12/06/2016  . Raynaud's disease without gangrene 12/06/2016  .  High risk medication use 12/06/2016  . Allergic asthma, mild intermittent, uncomplicated 74/82/7078  . History of hypertension 12/06/2016  . Excessive daytime sleepiness 07/26/2016  . Snoring 07/26/2016  . Laryngitis 11/10/2012  . Postnasal drip 05/02/2012  . Acute sinusitis, unspecified 10/24/2011  . HYPERTENSION 12/14/2009  . BRONCHITIS 12/14/2009  . LUPUS 12/14/2009  . History of melanoma 12/14/2009  . Seasonal and perennial allergic rhinitis 12/14/2009   Past Medical History:  Diagnosis Date  . Allergic rhinitis    skin test 01-18-10  . Bronchitis   . Lupus (systemic lupus erythematosus) (HCC)    joints and skin rash. Dr. Estanislado Pandy  . Melanoma ([place])    . Rhinosinusitis   . Skin cancer     Family History  Problem Relation Age of Onset  . Allergies Father   . Heart disease Father        bacterial endocarditis  . Cancer Maternal Grandmother   . Hypothyroidism Sister   . Hypothyroidism Sister   . Hashimoto's thyroiditis Sister   . Hepatitis Son     Past Surgical History:  Procedure Laterality Date  . ABDOMINAL HYSTERECTOMY    . ACHILLES TENDON REPAIR Right    Dr. Durward Fortes   . BREAST LUMPECTOMY Left    08/21/2019, 09/20/2019  . KNEE ARTHROPLASTY    . skin cancer extraction     Social History   Occupational History  . 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 Smoking status: Never Smoker  . Smokeless tobacco: Never Used  Vaping Use  . Vaping Use: Never used  Substance and Sexual Activity  . Alcohol use: Yes    Comment: 1 MONTHLY  . Drug use: No  . Sexual activity: Not on file

## 2020-10-01 LAB — URINALYSIS, ROUTINE W REFLEX MICROSCOPIC
Bilirubin Urine: NEGATIVE
Glucose, UA: NEGATIVE
Hgb urine dipstick: NEGATIVE
Ketones, ur: NEGATIVE
Leukocytes,Ua: NEGATIVE
Nitrite: NEGATIVE
Protein, ur: NEGATIVE
Specific Gravity, Urine: 1.013 (ref 1.001–1.03)
pH: >=8.5 — AB (ref 5.0–8.0)

## 2020-10-01 LAB — ANTI-DNA ANTIBODY, DOUBLE-STRANDED: ds DNA Ab: 1 IU/mL

## 2020-10-01 LAB — SEDIMENTATION RATE: Sed Rate: 2 mm/h (ref 0–30)

## 2020-10-01 LAB — C3 AND C4
C3 Complement: 153 mg/dL (ref 83–193)
C4 Complement: 29 mg/dL (ref 15–57)

## 2020-10-01 NOTE — Progress Notes (Signed)
DsDNA is negative.  Labs are not consistent with a flare.  No change in therapy recommended.

## 2020-10-01 NOTE — Progress Notes (Signed)
Complements and ESR WNL.  UA pH elevated, rest of UA normal.

## 2020-10-15 ENCOUNTER — Encounter: Payer: Self-pay | Admitting: Orthopaedic Surgery

## 2020-10-15 ENCOUNTER — Other Ambulatory Visit: Payer: Self-pay | Admitting: Orthopedic Surgery

## 2020-10-15 DIAGNOSIS — M7662 Achilles tendinitis, left leg: Secondary | ICD-10-CM | POA: Diagnosis not present

## 2020-10-15 DIAGNOSIS — M65872 Other synovitis and tenosynovitis, left ankle and foot: Secondary | ICD-10-CM | POA: Diagnosis not present

## 2020-10-15 MED ORDER — OXYCODONE HCL 5 MG PO TABS: 5.0000 mg | ORAL_TABLET | ORAL | 0 refills | Status: DC | PRN

## 2020-10-22 ENCOUNTER — Ambulatory Visit (INDEPENDENT_AMBULATORY_CARE_PROVIDER_SITE_OTHER): Payer: BC Managed Care – PPO | Admitting: Orthopaedic Surgery

## 2020-10-22 ENCOUNTER — Other Ambulatory Visit: Payer: Self-pay

## 2020-10-22 ENCOUNTER — Encounter: Payer: Self-pay | Admitting: Orthopaedic Surgery

## 2020-10-22 VITALS — Ht 63.0 in | Wt 200.0 lb

## 2020-10-22 DIAGNOSIS — M7662 Achilles tendinitis, left leg: Secondary | ICD-10-CM

## 2020-10-22 NOTE — Progress Notes (Signed)
Office Visit Note   Patient: Misty Curtis           Date of Birth: Oct 28, 1961           MRN: [id number] Visit Date: 10/22/2020              Requested by: Philmore Pali, NP [address] [place],  [place] [address] PCP: Philmore Pali, NP   Assessment & Plan: Visit Diagnoses:  1. Achilles tendinitis, left leg     Plan: 1 week status post exploration of left Achilles tendon insertion with removal of insertional tendinitis, the posterior angle of the os calcis and a retrocalcaneal bursa.  There was also evidence of a longitudinal tear of the peroneus longus by MRI scan.  I explored the peroneus tendons behind the lateral malleolus and found some synovitis but no tear.  This was debrided.  She seems to be doing well.  There is a little redness around the wound that I actually think is bruising as she really was not tender.  There is some spotty areas of decreased sensibility posterior to the incision but not on the plantar aspect of her foot or the lateral aspect of her foot.  I have applied mupropicin and a sterile bandage and an Ace bandage and an Manufacturing systems engineer. should continue nonweightbearing using the kneeling walker.  Return in 1 week.  I did remove every other stitch today  Follow-Up Instructions: Return in about 1 week (around 10/29/2020).   Orders:  No orders of the defined types were placed in this encounter.  No orders of the defined types were placed in this encounter.     Procedures: No procedures performed   Clinical Data: No additional findings.   Subjective: Chief Complaint  Patient presents with  . Left Ankle - Follow-up, Routine Post Op    Left Achilles exploration 10/15/2020  Patient presents today for follow up on her left ankle. She had an Achilles exploration on 10/15/2020. She is now one week out from surgery. She said that she has been doing great, but has noticed an increase in pain today. She is in a short leg splint and using a knee scooter to get  around.  HPI  Review of Systems   Objective: Vital Signs: Ht 5\' 3"  (1.6 m)   Wt 200 lb (90.7 kg)   BMI 35.43 kg/m   Physical Exam  Ortho Exam awake alert and oriented x3. Comfortable sitting in no distress. Wound looks just fine is little bit of ecchymosis but there is good feeling anterior the incision little decreased posterior to the incision but fine on the lateral aspect of her foot and the plantar aspect of her foot. I removed every other staple  Specialty Comments:  No specialty comments available.  Imaging: No results found.   PMFS History: Patient Active Problem List   Diagnosis Date Noted  . Heel pain, chronic, left 09/30/2020  . Achilles tendinitis, left leg 08/19/2020  . Heel pain, chronic, right 05/02/2019  . Thrush 06/11/2018  . Acquired hypothyroidism 12/08/2016  . Autoimmune disease ([place]) 12/06/2016  . Other fatigue 12/06/2016  . Raynaud's disease without gangrene 12/06/2016  . High risk medication use 12/06/2016  . Allergic asthma, mild intermittent, uncomplicated 82/99/3716  . History of hypertension 12/06/2016  . Excessive daytime sleepiness 07/26/2016  . Snoring 07/26/2016  . Laryngitis 11/10/2012  . Postnasal drip 05/02/2012  . Acute sinusitis, unspecified 10/24/2011  . HYPERTENSION 12/14/2009  . BRONCHITIS 12/14/2009  . LUPUS  12/14/2009  . History of melanoma 12/14/2009  . Seasonal and perennial allergic rhinitis 12/14/2009   Past Medical History:  Diagnosis Date  . Allergic rhinitis    skin test 01-18-10  . Bronchitis   . Lupus (systemic lupus erythematosus) (HCC)    joints and skin rash. Dr. Estanislado Pandy  . Melanoma ([place])   . Rhinosinusitis   . Skin cancer     Family History  Problem Relation Age of Onset  . Allergies Father   . Heart disease Father        bacterial endocarditis  . Cancer Maternal Grandmother   . Hypothyroidism Sister   . Hypothyroidism Sister   . Hashimoto's thyroiditis Sister   . Hepatitis Son     Past  Surgical History:  Procedure Laterality Date  . ABDOMINAL HYSTERECTOMY    . ACHILLES TENDON REPAIR Right    Dr. Durward Fortes   . BREAST LUMPECTOMY Left    08/21/2019, 09/20/2019  . KNEE ARTHROPLASTY    . skin cancer extraction     Social History   Occupational History  . 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 Smoking status: Never Smoker  . Smokeless tobacco: Never Used  Vaping Use  . Vaping Use: Never used  Substance and Sexual Activity  . Alcohol use: Yes    Comment: 1 MONTHLY  . Drug use: No  . Sexual activity: Not on file

## 2020-10-29 ENCOUNTER — Encounter: Payer: Self-pay | Admitting: Orthopaedic Surgery

## 2020-10-29 ENCOUNTER — Other Ambulatory Visit: Payer: Self-pay

## 2020-10-29 ENCOUNTER — Ambulatory Visit (INDEPENDENT_AMBULATORY_CARE_PROVIDER_SITE_OTHER): Payer: BC Managed Care – PPO | Admitting: Orthopaedic Surgery

## 2020-10-29 DIAGNOSIS — M7662 Achilles tendinitis, left leg: Secondary | ICD-10-CM

## 2020-10-29 NOTE — Progress Notes (Signed)
Office Visit Note   Patient: Misty Curtis           Date of Birth: 12-15-1961           MRN: [id number] Visit Date: 10/29/2020              Requested by: Philmore Pali, NP [address] [place],  [place] [address] PCP: Philmore Pali, NP   Assessment & Plan: Visit Diagnoses:  1. Achilles tendinitis, left leg     Plan: 2 weeks status post exploration of the left Achilles insertion and debridement of abnormal tissue.  Also explore the peroneal tendons with evidence of synovitis but no tear.  I remove the remainder of the staples and applied Steri-Strips over benzoin.  Applied a bulky dressing and the equalizer boot.  Remains nonweightbearing using the knee rolling walker and will reevaluate in 2 weeks  Follow-Up Instructions: Return in about 2 weeks (around 11/12/2020).   Orders:  No orders of the defined types were placed in this encounter.  No orders of the defined types were placed in this encounter.     Procedures: No procedures performed   Clinical Data: No additional findings.   Subjective: Chief Complaint  Patient presents with  . Left Ankle - Routine Post Op  2 weeks status post left ankle surgery as noted above.  Doing well.  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  No related fever chills  HPI  Review of Systems   Objective: Vital Signs: There were no vitals taken for this visit.  Physical Exam  Ortho Exam left heel wound appears to be healing without a problem.  The remaining staples removed and Steri-Strips applied.  Has a little altered sensibility on the heel side of the incision but has normal sensation on the dorsum of the foot and the plantar aspect of her foot.  Good capillary refill to the toes  Specialty Comments:  No specialty comments available.  Imaging: No results found.   PMFS History: Patient Active Problem List   Diagnosis Date Noted  . Heel pain, chronic, left 09/30/2020  .  Achilles tendinitis, left leg 08/19/2020  . Heel pain, chronic, right 05/02/2019  . Thrush 06/11/2018  . Acquired hypothyroidism 12/08/2016  . Autoimmune disease ([place]) 12/06/2016  . Other fatigue 12/06/2016  . Raynaud's disease without gangrene 12/06/2016  . High risk medication use 12/06/2016  . Allergic asthma, mild intermittent, uncomplicated 65/99/3570  . History of hypertension 12/06/2016  . Excessive daytime sleepiness 07/26/2016  . Snoring 07/26/2016  . Laryngitis 11/10/2012  . Postnasal drip 05/02/2012  . Acute sinusitis, unspecified 10/24/2011  . HYPERTENSION 12/14/2009  . BRONCHITIS 12/14/2009  . LUPUS 12/14/2009  . History of melanoma 12/14/2009  . Seasonal and perennial allergic rhinitis 12/14/2009   Past Medical History:  Diagnosis Date  . Allergic rhinitis    skin test 01-18-10  . Bronchitis   . Lupus (systemic lupus erythematosus) (HCC)    joints and skin rash. Dr. Estanislado Pandy  . Melanoma ([place])   . Rhinosinusitis   . Skin cancer     Family History  Problem Relation Age of Onset  . Allergies Father   . Heart disease Father        bacterial endocarditis  . Cancer Maternal Grandmother   . Hypothyroidism Sister   . Hypothyroidism Sister   . Hashimoto's thyroiditis Sister   . Hepatitis Son     Past Surgical History:  Procedure Laterality Date  . ABDOMINAL HYSTERECTOMY    . ACHILLES TENDON REPAIR Right    Dr. Durward Fortes   . BREAST LUMPECTOMY Left    08/21/2019, 09/20/2019  . KNEE ARTHROPLASTY    . skin cancer extraction     Social History   Occupational History  . 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 Smoking status: Never Smoker  . Smokeless tobacco: Never Used  Vaping Use  . Vaping Use: Never used  Substance and Sexual Activity  . Alcohol use: Yes    Comment: 1 MONTHLY  . Drug use: No  . Sexual activity: Not on file     Garald Balding, MD   Note - This record has been created using [employer].  Chart  creation errors have been sought, but may not always  have been located. Such creation errors do not reflect on  the standard of medical care.

## 2020-11-09 ENCOUNTER — Ambulatory Visit (INDEPENDENT_AMBULATORY_CARE_PROVIDER_SITE_OTHER): Payer: BC Managed Care – PPO | Admitting: Orthopaedic Surgery

## 2020-11-09 ENCOUNTER — Encounter: Payer: Self-pay | Admitting: Orthopaedic Surgery

## 2020-11-09 VITALS — Ht 63.0 in | Wt 200.0 lb

## 2020-11-09 DIAGNOSIS — M7662 Achilles tendinitis, left leg: Secondary | ICD-10-CM

## 2020-11-09 NOTE — Progress Notes (Signed)
Office Visit Note   Patient: Misty Curtis           Date of Birth: Feb 03, 1961           MRN: [id number] Visit Date: 11/09/2020              Requested by: Philmore Pali, NP [address] [place],  [place] [address] PCP: Philmore Pali, NP   Assessment & Plan: Visit Diagnoses:  1. Achilles tendinitis, left leg     Plan: Nearly 1 month status post left foot and ankle surgery involving debridement of the Achilles tendon insertion and exploration of the peroneus tendon muscles with synovectomy of the sheath.  Doing well.  Does not have any the pain she had preoperatively.  Had a little bit of blistering around the incision but it is seems to be related to some maceration.  Like her to keep the dressing dry.  No longer needs an Ace bandage which I think will help.  May gradually wean herself from the equalizer boot when she is not weightbearing and perform range of motion exercises.  May also wean herself from the kneeling walker and bear weight as tolerated in the boot.  Like to check her again in 2 weeks.  Follow-Up Instructions: Return in about 2 weeks (around 11/23/2020).   Orders:  No orders of the defined types were placed in this encounter.  No orders of the defined types were placed in this encounter.     Procedures: No procedures performed   Clinical Data: No additional findings.   Subjective: Chief Complaint  Patient presents with  . Left Ankle - Follow-up    Left Achilles exploration 10/15/2020  Patient presents today for follow up on her left ankle. She had a left Achilles exploration on 10/15/2020. She is almost 4 weeks out from surgery. She is wearing her boot and using a knee scooter. She states that she is doing well overall. No complaints.  HPI  Review of Systems   Objective: Vital Signs: Ht 5\' 3"  (1.6 m)   Wt 200 lb (90.7 kg)   BMI 35.43 kg/m   Physical Exam  Ortho Exam left ankle lateral incision appears to be healing well except for some macerated skin  that appears to be related to sweat and the boot.  The wound appears to be healing nicely.  Still has a little decreased sensibility inferior to the incision but anteriorly on the dorsum of her foot she is fine.  Achilles tendon is intact.  No localized tenderness.  Good capillary refill to toes  Specialty Comments:  No specialty comments available.  Imaging: No results found.   PMFS History: Patient Active Problem List   Diagnosis Date Noted  . Heel pain, chronic, left 09/30/2020  . Achilles tendinitis, left leg 08/19/2020  . Heel pain, chronic, right 05/02/2019  . Thrush 06/11/2018  . Acquired hypothyroidism 12/08/2016  . Autoimmune disease ([place]) 12/06/2016  . Other fatigue 12/06/2016  . Raynaud's disease without gangrene 12/06/2016  . High risk medication use 12/06/2016  . Allergic asthma, mild intermittent, uncomplicated 55/73/2202  . History of hypertension 12/06/2016  . Excessive daytime sleepiness 07/26/2016  . Snoring 07/26/2016  . Laryngitis 11/10/2012  . Postnasal drip 05/02/2012  . Acute sinusitis, unspecified 10/24/2011  . HYPERTENSION 12/14/2009  . BRONCHITIS 12/14/2009  . LUPUS 12/14/2009  . History of melanoma 12/14/2009  . Seasonal and perennial allergic rhinitis 12/14/2009   Past Medical History:  Diagnosis Date  . Allergic rhinitis  skin test 01-18-10  . Bronchitis   . Lupus (systemic lupus erythematosus) (HCC)    joints and skin rash. Dr. Estanislado Pandy  . Melanoma ([place])   . Rhinosinusitis   . Skin cancer     Family History  Problem Relation Age of Onset  . Allergies Father   . Heart disease Father        bacterial endocarditis  . Cancer Maternal Grandmother   . Hypothyroidism Sister   . Hypothyroidism Sister   . Hashimoto's thyroiditis Sister   . Hepatitis Son     Past Surgical History:  Procedure Laterality Date  . ABDOMINAL HYSTERECTOMY    . ACHILLES TENDON REPAIR Right    Dr. Durward Fortes   . BREAST LUMPECTOMY Left    08/21/2019, 09/20/2019    . KNEE ARTHROPLASTY    . skin cancer extraction     Social History   Occupational History  . 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 Smoking status: Never Smoker  . Smokeless tobacco: Never Used  Vaping Use  . Vaping Use: Never used  Substance and Sexual Activity  . Alcohol use: Yes    Comment: 1 MONTHLY  . Drug use: No  . Sexual activity: Not on file

## 2020-11-11 ENCOUNTER — Other Ambulatory Visit: Payer: Self-pay | Admitting: Rheumatology

## 2020-11-11 DIAGNOSIS — M359 Systemic involvement of connective tissue, unspecified: Secondary | ICD-10-CM

## 2020-11-11 NOTE — Telephone Encounter (Signed)
Last Visit: 09/30/2020 Next Visit: 03/04/2021 Labs: 09/01/2020 CBC and CMP are normal. Eye exam: 04/01/2020 WNL  Current Dose per office note 09/30/2020: PLQ 200 mg 1 tablet in the morning and 1/2 tablet in the evening DX:  Autoimmune disease   Okay to refill per Dr. Estanislado Pandy

## 2020-11-15 ENCOUNTER — Encounter: Payer: Self-pay | Admitting: Orthopaedic Surgery

## 2020-11-16 ENCOUNTER — Other Ambulatory Visit: Payer: Self-pay | Admitting: Neurology

## 2020-11-16 MED ORDER — MODAFINIL 100 MG PO TABS: 150.0000 mg | ORAL_TABLET | Freq: Every day | ORAL | 3 refills | Status: DC

## 2020-11-16 NOTE — Telephone Encounter (Signed)
Received a refill request fax from patient's pharmacy. Patient is requesting a refill on modafinil 100mg . She was last seen on 02/26/20. Her next appt is scheduled for 02/25/21 with Judson Roch. Last refill was written on 07/20/20 for 42 tablets, 3 refills. [facility] has been reviewed. She last refilled this medication on 10/15/20 for 42 tablets.   Sarah, please advise if you are ok with this refill. Thanks!

## 2020-11-17 ENCOUNTER — Telehealth: Payer: Self-pay | Admitting: Neurology

## 2020-11-17 NOTE — Telephone Encounter (Signed)
PA has been approved 11/17/20-11/17/21. Will fax a copy of the determination to patient's pharmacy.

## 2020-11-17 NOTE — Telephone Encounter (Signed)
Received a PA request for modafinil 100mg . PA was started on [email or web address]. Key is [id number]. Per [employer], a determination will be received within 24 hours.

## 2020-11-25 ENCOUNTER — Other Ambulatory Visit: Payer: Self-pay

## 2020-11-25 ENCOUNTER — Encounter: Payer: Self-pay | Admitting: Orthopaedic Surgery

## 2020-11-25 ENCOUNTER — Ambulatory Visit: Payer: BC Managed Care – PPO | Admitting: Orthopaedic Surgery

## 2020-11-25 VITALS — Ht 63.0 in | Wt 200.0 lb

## 2020-11-25 DIAGNOSIS — M7662 Achilles tendinitis, left leg: Secondary | ICD-10-CM

## 2020-11-25 DIAGNOSIS — G8929 Other chronic pain: Secondary | ICD-10-CM

## 2020-11-25 DIAGNOSIS — M79672 Pain in left foot: Secondary | ICD-10-CM

## 2020-11-25 NOTE — Progress Notes (Signed)
Office Visit Note   Patient: Misty Curtis           Date of Birth: December 28, 1960           MRN: [id number] Visit Date: 11/25/2020              Requested by: Philmore Pali, NP [address] [place],  [place] [address] PCP: Philmore Pali, NP   Assessment & Plan: Visit Diagnoses:  1. Achilles tendinitis, left leg   2. Heel pain, chronic, left     Plan: 6 weeks status post exploration of left Achilles insertion and debridement of abnormal tissue.  Also explore the peroneal tendons with evidence of tenosynovitis.  This was debrided.  Doing quite well and not having any pain.  Will progress to a short boot with weightbearing as tolerated with or without ambulatory aid and return in 2 weeks.  Has been working out difficulty  Follow-Up Instructions: Return in about 2 weeks (around 12/09/2020).   Orders:  No orders of the defined types were placed in this encounter.  No orders of the defined types were placed in this encounter.     Procedures: No procedures performed   Clinical Data: No additional findings.   Subjective: Chief Complaint  Patient presents with  . Left Ankle - Follow-up    Left achilles exploration 10/15/2020  Patient presents today for follow up on her left ankle. She had achilles exploration surgery on 10/15/2020. She is now about 6 weeks out from surgery. She is doing great. No complaints or pain.  HPI  Review of Systems   Objective: Vital Signs: Ht 5\' 3"  (1.6 m)   Wt 200 lb (90.7 kg)   BMI 35.43 kg/m   Physical Exam  Ortho Exam incision left heel is healing without problems.  Still some mild induration.  Very minimal swelling.  Neurologically intact except for some decreased sensibility inferior to the incision.  No change from prior evaluations.  Does have some limitation of ankle motion but working on exercises  Specialty Comments:  No specialty comments available.  Imaging: No results found.   PMFS History: Patient Active Problem List    Diagnosis Date Noted  . Heel pain, chronic, left 09/30/2020  . Achilles tendinitis, left leg 08/19/2020  . Heel pain, chronic, right 05/02/2019  . Thrush 06/11/2018  . Acquired hypothyroidism 12/08/2016  . Autoimmune disease ([place]) 12/06/2016  . Other fatigue 12/06/2016  . Raynaud's disease without gangrene 12/06/2016  . High risk medication use 12/06/2016  . Allergic asthma, mild intermittent, uncomplicated 62/70/3500  . History of hypertension 12/06/2016  . Excessive daytime sleepiness 07/26/2016  . Snoring 07/26/2016  . Laryngitis 11/10/2012  . Postnasal drip 05/02/2012  . Acute sinusitis, unspecified 10/24/2011  . HYPERTENSION 12/14/2009  . BRONCHITIS 12/14/2009  . LUPUS 12/14/2009  . History of melanoma 12/14/2009  . Seasonal and perennial allergic rhinitis 12/14/2009   Past Medical History:  Diagnosis Date  . Allergic rhinitis    skin test 01-18-10  . Bronchitis   . Lupus (systemic lupus erythematosus) (HCC)    joints and skin rash. Dr. Estanislado Pandy  . Melanoma ([place])   . Rhinosinusitis   . Skin cancer     Family History  Problem Relation Age of Onset  . Allergies Father   . Heart disease Father        bacterial endocarditis  . Cancer Maternal Grandmother   . Hypothyroidism Sister   . Hypothyroidism Sister   . Hashimoto's thyroiditis Sister   .  Hepatitis Son     Past Surgical History:  Procedure Laterality Date  . ABDOMINAL HYSTERECTOMY    . ACHILLES TENDON REPAIR Right    Dr. Durward Fortes   . BREAST LUMPECTOMY Left    08/21/2019, 09/20/2019  . KNEE ARTHROPLASTY    . skin cancer extraction     Social History   Occupational History  . 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 Smoking status: Never Smoker  . Smokeless tobacco: Never Used  Vaping Use  . Vaping Use: Never used  Substance and Sexual Activity  . Alcohol use: Yes    Comment: 1 MONTHLY  . Drug use: No  . Sexual activity: Not on file

## 2020-12-08 ENCOUNTER — Other Ambulatory Visit: Payer: Self-pay | Admitting: Rheumatology

## 2020-12-08 NOTE — Telephone Encounter (Signed)
Last Visit: 09/30/2020 Next Visit: 03/04/2021  Okay to refill Zofran?

## 2020-12-09 ENCOUNTER — Encounter: Payer: Self-pay | Admitting: Orthopaedic Surgery

## 2020-12-09 ENCOUNTER — Other Ambulatory Visit: Payer: Self-pay

## 2020-12-09 ENCOUNTER — Ambulatory Visit: Payer: BC Managed Care – PPO | Admitting: Orthopaedic Surgery

## 2020-12-09 VITALS — Ht 63.0 in | Wt 200.0 lb

## 2020-12-09 DIAGNOSIS — G8929 Other chronic pain: Secondary | ICD-10-CM

## 2020-12-09 DIAGNOSIS — M79672 Pain in left foot: Secondary | ICD-10-CM

## 2020-12-09 NOTE — Progress Notes (Signed)
Office Visit Note   Patient: Misty Curtis           Date of Birth: Jan 10, 1961           MRN: [id number] Visit Date: 12/09/2020              Requested by: Philmore Pali, NP [address] [place],  [place] [address] PCP: Philmore Pali, NP   Assessment & Plan: Visit Diagnoses:  1. Heel pain, chronic, left     Plan: Almost 2 months status post insertional Achilles tendinitis exploration and debridement and exploration of the peroneal tendon sheath with synovectomy.  Doing well.  I think at this point she can come out of the boot.  She is working on range of motion exercises.  Has a little loss of sensibility along the lateral aspect of her heel inferior to the incision otherwise neurologically intact.  Swelling is down but still has some induration laterally.  We will plan to check her back in 1 month.  Follow-Up Instructions: Return in about 1 month (around 01/09/2021).   Orders:  No orders of the defined types were placed in this encounter.  No orders of the defined types were placed in this encounter.     Procedures: No procedures performed   Clinical Data: No additional findings.   Subjective: Chief Complaint  Patient presents with  . Left Ankle - Follow-up    Left Achilles exploration 10/15/2020  Patient presents today for a two week follow up on her left ankle. She had a left Achilles exploration on 10/15/2020. She is now 8 weeks out from surgery. She is doing well. She is in a short boot with the assistance of a cane for support if needed. She has been walking in the house without the boot.   HPI  Review of Systems   Objective: Vital Signs: Ht 5\' 3"  (1.6 m)   Wt 200 lb (90.7 kg)   BMI 35.43 kg/m   Physical Exam  Ortho Exam still has little bit of swelling along the lateral aspect of the left heel.  Incision is healed nicely.  Motor exam intact.  Does have still little loss of eversion of the foot but the peroneal function appears to be intact.  Some decreased  sensibility beneath the incision along the lateral heel but find anterior to the incision  Specialty Comments:  No specialty comments available.  Imaging: No results found.   PMFS History: Patient Active Problem List   Diagnosis Date Noted  . Heel pain, chronic, left 09/30/2020  . Achilles tendinitis, left leg 08/19/2020  . Heel pain, chronic, right 05/02/2019  . Thrush 06/11/2018  . Acquired hypothyroidism 12/08/2016  . Autoimmune disease ([place]) 12/06/2016  . Other fatigue 12/06/2016  . Raynaud's disease without gangrene 12/06/2016  . High risk medication use 12/06/2016  . Allergic asthma, mild intermittent, uncomplicated [id number]  . History of hypertension 12/06/2016  . Excessive daytime sleepiness 07/26/2016  . Snoring 07/26/2016  . Laryngitis 11/10/2012  . Postnasal drip 05/02/2012  . Acute sinusitis, unspecified 10/24/2011  . HYPERTENSION 12/14/2009  . BRONCHITIS 12/14/2009  . LUPUS 12/14/2009  . History of melanoma 12/14/2009  . Seasonal and perennial allergic rhinitis 12/14/2009   Past Medical History:  Diagnosis Date  . Allergic rhinitis    skin test 01-18-10  . Bronchitis   . Lupus (systemic lupus erythematosus) (HCC)    joints and skin rash. Dr. Estanislado Pandy  . Melanoma ([facility] [place])   . Rhinosinusitis   .  Skin cancer     Family History  Problem Relation Age of Onset  . Allergies Father   . Heart disease Father        bacterial endocarditis  . Cancer Maternal Grandmother   . Hypothyroidism Sister   . Hypothyroidism Sister   . Hashimoto's thyroiditis Sister   . Hepatitis Son     Past Surgical History:  Procedure Laterality Date  . ABDOMINAL HYSTERECTOMY    . ACHILLES TENDON REPAIR Right    Dr. Durward Fortes   . BREAST LUMPECTOMY Left    08/21/2019, 09/20/2019  . KNEE ARTHROPLASTY    . skin cancer extraction     Social History   Occupational History  . 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 Smoking status: Never Smoker  . Smokeless  tobacco: Never Used  Vaping Use  . Vaping Use: Never used  Substance and Sexual Activity  . Alcohol use: Yes    Comment: 1 MONTHLY  . Drug use: No  . Sexual activity: Not on file

## 2020-12-31 ENCOUNTER — Other Ambulatory Visit: Payer: Self-pay | Admitting: Rheumatology

## 2021-01-01 NOTE — Telephone Encounter (Signed)
LMOM for patient that it is time to have labs performed

## 2021-01-01 NOTE — Telephone Encounter (Signed)
Last Visit: 1013/2021 Next Visit: 03/04/2021 Labs: 09/01/2020, CBC and CMP are normal. Current Dose per office note 1013/2021,  methotrexate 5 tablets by mouth once weekly DX: Autoimmune disease   Okay to refill MTX?

## 2021-01-01 NOTE — Telephone Encounter (Signed)
Please advise the patient to update CBC and CMP.   Ok to refill 30 day supply of MTX.

## 2021-01-05 ENCOUNTER — Other Ambulatory Visit: Payer: Self-pay | Admitting: Internal Medicine

## 2021-01-05 ENCOUNTER — Encounter: Payer: Self-pay | Admitting: Rheumatology

## 2021-01-06 ENCOUNTER — Ambulatory Visit: Payer: BC Managed Care – PPO | Admitting: Orthopaedic Surgery

## 2021-01-06 ENCOUNTER — Encounter: Payer: Self-pay | Admitting: Orthopaedic Surgery

## 2021-01-06 ENCOUNTER — Other Ambulatory Visit: Payer: Self-pay

## 2021-01-06 ENCOUNTER — Other Ambulatory Visit: Payer: Self-pay | Admitting: *Deleted

## 2021-01-06 DIAGNOSIS — G8929 Other chronic pain: Secondary | ICD-10-CM

## 2021-01-06 DIAGNOSIS — Z79899 Other long term (current) drug therapy: Secondary | ICD-10-CM

## 2021-01-06 DIAGNOSIS — M7662 Achilles tendinitis, left leg: Secondary | ICD-10-CM

## 2021-01-06 DIAGNOSIS — M79672 Pain in left foot: Secondary | ICD-10-CM

## 2021-01-06 NOTE — Progress Notes (Signed)
Office Visit Note   Patient: Misty Curtis           Date of Birth: [birth year]           MRN: [id number] Visit Date: 01/06/2021              Requested by: Philmore Pali, NP [address] [place],  [place] [address] PCP: Philmore Pali, NP   Assessment & Plan: Visit Diagnoses:  1. Achilles tendinitis, left leg   2. Heel pain, chronic, left     Plan: Nearly 3 months status post exploration of insertional Achilles tendinitis and debridement of peroneal tendon sheath with synovitis.  Peroneal tendons were intact.  Doing well.  Working full-time.  Does have some swelling but no pain.  Has a little bit of decrease sensibility below the incision but in a very localized area.  Plantar aspect and dorsal aspect of the foot are fine.  Walks without a limp.  I think she is doing very well.  I would suspect that the swelling will just subside over time. fortunately she is  not having any pain.  Just continue working on exercises and we will plan to see her back as needed  Follow-Up Instructions: Return if symptoms worsen or fail to improve.   Orders:  No orders of the defined types were placed in this encounter.  No orders of the defined types were placed in this encounter.     Procedures: No procedures performed   Clinical Data: No additional findings.   Subjective: Chief Complaint  Patient presents with  . Other    F/u achilles  Nearly 3 months status post exploration of insertional Achilles tendinitis left heel and exploration of the peroneal tendon sheath with a synovectomy.  Doing well.  Not having any pain.  Does have some swelling but otherwise doing fine  HPI  Review of Systems   Objective: Vital Signs: There were no vitals taken for this visit.  Physical Exam  Ortho Exam awake alert and oriented x3.  Comfortable sitting.  Walks without a limp.  Does have some swelling along the lateral aspect of her left ankle which is relatively mild.  No induration.  Incision is healed  nicely there is some decrease sensibility just below the incision but the plantar aspect of her foot and dorsal aspect of her foot abnormal sensation.  Dorsiflexes about 10 to 15 degrees and plantar flexes about 20 degrees.  No pain over the Achilles tendon or along the peroneal tendon sheath.  Good capillary refill to toes  Specialty Comments:  No specialty comments available.  Imaging: No results found.   PMFS History: Patient Active Problem List   Diagnosis Date Noted  . Heel pain, chronic, left 09/30/2020  . Achilles tendinitis, left leg 08/19/2020  . Heel pain, chronic, right 05/02/2019  . Thrush 06/11/2018  . Acquired hypothyroidism 12/08/2016  . Autoimmune disease ([place]) 12/06/2016  . Other fatigue 12/06/2016  . Raynaud's disease without gangrene 12/06/2016  . High risk medication use 12/06/2016  . Allergic asthma, mild intermittent, uncomplicated 50/27/7412  . History of hypertension 12/06/2016  . Excessive daytime sleepiness 07/26/2016  . Snoring 07/26/2016  . Laryngitis 11/10/2012  . Postnasal drip 05/02/2012  . Acute sinusitis, unspecified 10/24/2011  . HYPERTENSION 12/14/2009  . BRONCHITIS 12/14/2009  . LUPUS 12/14/2009  . History of melanoma 12/14/2009  . Seasonal and perennial allergic rhinitis 12/14/2009   Past Medical History:  Diagnosis Date  . Allergic rhinitis  skin test 01-18-10  . Bronchitis   . Lupus (systemic lupus erythematosus) (HCC)    joints and skin rash. Dr. Estanislado Pandy  . Melanoma ([place])   . Rhinosinusitis   . Skin cancer     Family History  Problem Relation Age of Onset  . Allergies Father   . Heart disease Father        bacterial endocarditis  . Cancer Maternal Grandmother   . Hypothyroidism Sister   . Hypothyroidism Sister   . Hashimoto's thyroiditis Sister   . Hepatitis Son     Past Surgical History:  Procedure Laterality Date  . ABDOMINAL HYSTERECTOMY    . ACHILLES TENDON REPAIR Right    Dr. Durward Fortes   . BREAST LUMPECTOMY  Left    08/21/2019, 09/20/2019  . KNEE ARTHROPLASTY    . skin cancer extraction     Social History   Occupational History  . 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 Smoking status: Never Smoker  . Smokeless tobacco: Never Used  Vaping Use  . Vaping Use: Never used  Substance and Sexual Activity  . Alcohol use: Yes    Comment: 1 MONTHLY  . Drug use: No  . Sexual activity: Not on file     Garald Balding, MD   Note - This record has been created using [employer].  Chart creation errors have been sought, but may not always  have been located. Such creation errors do not reflect on  the standard of medical care.

## 2021-01-07 LAB — CBC WITH DIFFERENTIAL/PLATELET
Absolute Monocytes: 561 cells/uL (ref 200–950)
Basophils Absolute: 40 cells/uL (ref 0–200)
Basophils Relative: 0.5 %
Eosinophils Absolute: 150 cells/uL (ref 15–500)
Eosinophils Relative: 1.9 %
HCT: 44 % (ref 35.0–45.0)
Hemoglobin: 15 g/dL (ref 11.7–15.5)
Lymphs Abs: 2346 cells/uL (ref 850–3900)
MCH: 30.4 pg (ref 27.0–33.0)
MCHC: 34.1 g/dL (ref 32.0–36.0)
MCV: 89.1 fL (ref 80.0–100.0)
MPV: 9.9 fL (ref 7.5–12.5)
Monocytes Relative: 7.1 %
Neutro Abs: 4803 cells/uL (ref 1500–7800)
Neutrophils Relative %: 60.8 %
Platelets: 329 10*3/uL (ref 140–400)
RBC: 4.94 10*6/uL (ref 3.80–5.10)
RDW: 13.3 % (ref 11.0–15.0)
Total Lymphocyte: 29.7 %
WBC: 7.9 10*3/uL (ref 3.8–10.8)

## 2021-01-07 LAB — COMPLETE METABOLIC PANEL WITH GFR
AG Ratio: 1.5 (calc) (ref 1.0–2.5)
ALT: 17 U/L (ref 6–29)
AST: 27 U/L (ref 10–35)
Albumin: 4.1 g/dL (ref 3.6–5.1)
Alkaline phosphatase (APISO): 96 U/L (ref 37–153)
BUN: 14 mg/dL (ref 7–25)
CO2: 31 mmol/L (ref 20–32)
Calcium: 9.7 mg/dL (ref 8.6–10.4)
Chloride: 104 mmol/L (ref 98–110)
Creat: 0.85 mg/dL (ref 0.50–0.99)
GFR, Est African American: 86 mL/min/{1.73_m2} (ref >=60)
GFR, Est Non African American: 74 mL/min/{1.73_m2} (ref >=60)
Globulin: 2.7 g/dL (calc) (ref 1.9–3.7)
Glucose, Bld: 100 mg/dL — ABNORMAL HIGH (ref 65–99)
Potassium: 4 mmol/L (ref 3.5–5.3)
Sodium: 143 mmol/L (ref 135–146)
Total Bilirubin: 0.3 mg/dL (ref 0.2–1.2)
Total Protein: 6.8 g/dL (ref 6.1–8.1)

## 2021-02-05 ENCOUNTER — Other Ambulatory Visit: Payer: Self-pay | Admitting: Rheumatology

## 2021-02-05 ENCOUNTER — Other Ambulatory Visit: Payer: Self-pay | Admitting: Physician Assistant

## 2021-02-05 DIAGNOSIS — M359 Systemic involvement of connective tissue, unspecified: Secondary | ICD-10-CM

## 2021-02-08 NOTE — Telephone Encounter (Signed)
Last Visit: 09/30/2020 Next Visit: 03/04/2021 Labs: 01/06/2021 CBC and CMP WNL  Current Dose per office note 09/30/2020: MTX 5 tablets by mouth once a week DX: Autoimmune disease   Last Fill: 01/01/2021 (30 day supply)  Okay to refill per Dr. Estanislado Pandy

## 2021-02-08 NOTE — Telephone Encounter (Signed)
Last Visit: 09/30/2020 Next Visit: 03/04/2021 Labs: 01/06/2021 CBC and CMP WNL PLQ eye exam: 04/01/2020 WNL  Current Dose per office note 09/30/2020: PLQ 200 mg 1 tablet in the morning and 1/2 tablet in the evening DX: Autoimmune disease   Last Fill: 11/11/2020  Okay to refill per Dr. Estanislado Pandy.

## 2021-02-17 ENCOUNTER — Other Ambulatory Visit: Payer: Self-pay | Admitting: Internal Medicine

## 2021-02-19 NOTE — Progress Notes (Signed)
Office Visit Note  Patient: Misty Curtis             Date of Birth: March 13, 1961           MRN: [id number]             PCP: Philmore Pali, NP Referring: Philmore Pali, NP Visit Date: 03/04/2021 Occupation: @GUAROCC @  Subjective:  Medication management.   History of Present Illness: Misty Curtis is a 60 y.o. female with a history of autoimmune disease and osteoarthritis.  She states she continues to have fatigue,  dry mouth, dry eyes raynauds phenomenon and arthralgias.  She denies history of oral ulcers, nasal ulcers, malar rash or joint swelling.  She had good response to the Achilles tendon surgery and does not have much discomfort now.  She states that her right knee joint where she had surgery in the past continues to bother her.  She has not noticed any joint swelling.  Activities of Daily Living:  Patient reports morning stiffness for 30 minutes.   Patient Reports nocturnal pain.  Difficulty dressing/grooming: Denies Difficulty climbing stairs: Reports Difficulty getting out of chair: Reports Difficulty using hands for taps, buttons, cutlery, and/or writing: Reports  Review of Systems  Constitutional: Positive for fatigue. Negative for night sweats, weight gain and weight loss.  HENT: Positive for mouth dryness and nose dryness. Negative for mouth sores, trouble swallowing and trouble swallowing.   Eyes: Positive for itching and dryness. Negative for pain, redness and visual disturbance.  Respiratory: Negative for cough, shortness of breath and difficulty breathing.   Cardiovascular: Negative for chest pain, palpitations, hypertension, irregular heartbeat and swelling in legs/feet.  Gastrointestinal: Negative for blood in stool, constipation and diarrhea.  Endocrine: Negative for increased urination.  Genitourinary: Negative for difficulty urinating and vaginal dryness.  Musculoskeletal: Positive for arthralgias, joint pain, myalgias, morning stiffness, muscle tenderness and myalgias.  Negative for joint swelling and muscle weakness.  Skin: Positive for color change and rash. Negative for hair loss, redness, skin tightness, ulcers and sensitivity to sunlight.  Allergic/Immunologic: Positive for susceptible to infections.  Neurological: Positive for numbness. Negative for dizziness, headaches, memory loss, night sweats and weakness.  Hematological: Positive for bruising/bleeding tendency. Negative for swollen glands.  Psychiatric/Behavioral: Negative for depressed mood, confusion and sleep disturbance. The patient is not nervous/anxious.     PMFS History:  Patient Active Problem List   Diagnosis Date Noted  . Heel pain, chronic, left 09/30/2020  . Achilles tendinitis, left leg 08/19/2020  . Heel pain, chronic, right 05/02/2019  . Thrush 06/11/2018  . Acquired hypothyroidism 12/08/2016  . Autoimmune disease ([place]) 12/06/2016  . Other fatigue 12/06/2016  . Raynaud's disease without gangrene 12/06/2016  . High risk medication use 12/06/2016  . Allergic asthma, mild intermittent, uncomplicated 76/28/3151  . History of hypertension 12/06/2016  . Excessive daytime sleepiness 07/26/2016  . Snoring 07/26/2016  . Laryngitis 11/10/2012  . Postnasal drip 05/02/2012  . Acute sinusitis, unspecified 10/24/2011  . HYPERTENSION 12/14/2009  . BRONCHITIS 12/14/2009  . LUPUS 12/14/2009  . History of melanoma 12/14/2009  . Seasonal and perennial allergic rhinitis 12/14/2009    Past Medical History:  Diagnosis Date  . Allergic rhinitis    skin test 01-18-10  . Bronchitis   . Lupus (systemic lupus erythematosus) (HCC)    joints and skin rash. Dr. Estanislado Pandy  . Melanoma ([place])   . Rhinosinusitis   . Skin cancer     Family History  Problem Relation Age of Onset  .  Allergies Father   . Heart disease Father        bacterial endocarditis  . Cancer Maternal Grandmother   . Hypothyroidism Sister   . Hypothyroidism Sister   . Hashimoto's thyroiditis Sister   . Hepatitis Son     Past Surgical History:  Procedure Laterality Date  . ABDOMINAL HYSTERECTOMY    . ACHILLES TENDON REPAIR Bilateral    Dr. Durward Fortes   . BREAST LUMPECTOMY Left    08/21/2019, 09/20/2019  . KNEE ARTHROPLASTY    . skin cancer extraction     Social History   Social History Narrative  . Not on file   Immunization History  Administered Date(s) Administered  . Influenza Split 09/19/2011, 09/12/2012, 09/02/2013  . Influenza-Unspecified 10/19/2014  . Moderna Sars-Covid-2 Vaccination 02/26/2020, 03/25/2020, 12/09/2020  . Pneumococcal Polysaccharide-23 08/19/2010  . Pneumococcal-Unspecified 07/01/2020     Objective: Vital Signs: BP (!) 173/94 (BP Location: Right Arm, Patient Position: Sitting, Cuff Size: Normal)   Pulse 80   Resp 15   Ht 5\' 3"  (1.6 m)   Wt 202 lb (91.6 kg)   BMI 35.78 kg/m    Physical Exam Vitals and nursing note reviewed.  Constitutional:      Appearance: She is well-developed.  HENT:     Head: Normocephalic and atraumatic.  Eyes:     Conjunctiva/sclera: Conjunctivae normal.  Cardiovascular:     Rate and Rhythm: Normal rate and regular rhythm.     Heart sounds: Normal heart sounds.  Pulmonary:     Effort: Pulmonary effort is normal.     Breath sounds: Normal breath sounds.  Abdominal:     General: Bowel sounds are normal.     Palpations: Abdomen is soft.  Musculoskeletal:     Cervical back: Normal range of motion.  Lymphadenopathy:     Cervical: No cervical adenopathy.  Skin:    General: Skin is warm and dry.     Capillary Refill: Capillary refill takes less than 2 seconds.  Neurological:     Mental Status: She is alert and oriented to person, place, and time.  Psychiatric:        Behavior: Behavior normal.      Musculoskeletal Exam: C-spine was in good range of motion.  Shoulder joints, elbow joints, wrist joints were in good range of motion.  She had bilateral PIP and DIP thickening.  Hip joints were in good range of motion.  She had some  discomfort range of motion of her right knee joint without any warmth swelling or effusion.  She had no tenderness over ankles or MTPs.  CDAI Exam: CDAI Score: - Patient Global: -; Provider Global: - Swollen: -; Tender: - Joint Exam 03/04/2021   No joint exam has been documented for this visit   There is currently no information documented on the homunculus. Go to the Rheumatology activity and complete the homunculus joint exam.  Investigation: No additional findings.  Imaging: No results found.  Recent Labs: Lab Results  Component Value Date   WBC 7.9 01/06/2021   HGB 15.0 01/06/2021   PLT 329 01/06/2021   NA 143 01/06/2021   K 4.0 01/06/2021   CL 104 01/06/2021   CO2 31 01/06/2021   GLUCOSE 100 (H) 01/06/2021   BUN 14 01/06/2021   CREATININE 0.85 01/06/2021   BILITOT 0.3 01/06/2021   ALKPHOS 82 06/02/2017   AST 27 01/06/2021   ALT 17 01/06/2021   PROT 6.8 01/06/2021   ALBUMIN 4.1 06/02/2017   CALCIUM 9.7 01/06/2021  GFRAA 86 01/06/2021    Speciality Comments: PLQ eye exam: 04/01/2020 WNL at Dr Myna Hidalgo. Hulan Saas, OD Follow up in 1 year  Procedures:  No procedures performed Allergies: Latex, Lactose intolerance (gi), Milk-related compounds, Neomycin-bacitracin zn-polymyx, Onion, Strawberry extract, and Wixela inhub [fluticasone-salmeterol]   Assessment / Plan:     Visit Diagnoses: Autoimmune disease ([place]) - History of positive ANA, DS DNA, Raynauds, fatigue, oral ulcers, nasal ulcers, arthralgias, sicca: She continues to have sicca symptoms and arthralgias.  She has not had any recent problems with oral ulcers or nasal ulcers.  She continues to have Raynaud's symptoms.  Keeping core temperature warm and warm clothing was discussed.  High risk medication use - PLQ 200 mg 1 tablet in the morning and 1/2 tablet in the evening, MTX 5 tablets by mouth once a week, and folic acid 1 mg daily. PLQ eye exam: 04/01/2020.  High also discussed decreasing Plaquenil dose but she is not  ready for it.  She will be getting eye examination in April.  Her labs are stable.  All autoimmune labs have been negative.  We will check labs again in May and every 5 months to monitor for drug toxicity and disease process.  She is fully vaccinated against COVID-19.  I discussed the option of fourth dose (booster) 38months after the third dose.  Patient states that she had Shingrix and pneumococcal vaccine in the past.  Instructions were placed in the AVS.  Raynaud's disease without gangrene -keeping core temperature warm was discussed.  Right carpal tunnel syndrome-she is off-and-on symptoms.  Achilles tendinitis of right lower extremity - Resolved. She had successful insertional achilles surgery performed by Dr. Durward Fortes in the past.  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  History of melanoma - She follows up with her dermatologist on a regular basis.  History of hypothyroidism  History of fatigue  History of asthma  History of sleep apnea  History of hypertension-her blood pressure is elevated today.  She believes that she has whitecoat syndrome.  She also drove 40 minutes coming to the appointment.  She states her blood pressure at home was 120/80 before she left.  I have advised her to monitor blood pressure closely and follow-up with the PCP.  Increased risk of heart disease in patients with autoimmune disease was discussed.  Dietary modifications and regular exercise was emphasized.  Instructions were placed in the AVS.  Pitting edema  Osteoporosis screening-patient states that she gets her DEXA scan through her PCP.  She states her bone density has been normal.  Orders: Orders Placed This Encounter  Procedures  . CBC with Differential/Platelet  . COMPLETE METABOLIC PANEL WITH GFR  . Urinalysis, Routine w reflex  microscopic  . Anti-DNA antibody, double-stranded  . C3 and C4  . Sedimentation rate  . ANA   No orders of the defined types were placed in this encounter.     Follow-Up Instructions: Return in about 5 months (around 08/04/2021) for Autoimmune disease.   Bo Merino, MD  Note - This record has been created using [employer].  Chart creation errors have been sought, but may not always  have been located. Such creation errors do not reflect on  the standard of medical care.

## 2021-02-25 ENCOUNTER — Encounter: Payer: Self-pay | Admitting: Neurology

## 2021-02-25 ENCOUNTER — Ambulatory Visit: Payer: BC Managed Care – PPO | Admitting: Neurology

## 2021-02-25 VITALS — BP 170/91 | HR 75 | Ht 63.0 in | Wt 202.0 lb

## 2021-02-25 DIAGNOSIS — G4719 Other hypersomnia: Secondary | ICD-10-CM | POA: Diagnosis not present

## 2021-02-25 NOTE — Progress Notes (Signed)
PATIENT: Misty Curtis DOB: April 18, 1961  REASON FOR VISIT: follow up HISTORY FROM: patient  HISTORY OF PRESENT ILLNESS: Today 02/25/21  Misty Curtis is a 60 year old female with history of idiopathic hypersomnia.  Remains on Provigil 150 mg daily.  200 mg was too much, 100 mg was not enough.  The 150 mg dosage seems to work well.  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  Had a rough couple months with her lupus.  BP elevated today, checks at home, usually 140/80.  Recently had left Achilles reconstructive surgery.  Here today for evaluation unaccompanied. FSS was 32.   Update February 26, 2020 SS: Ms. Leys is a 60 year old female with history of idiopathic hypersomnia.  She remains on Provigil 200 mg daily, however she is only taking half tablet.  When she takes the 200 mg, she says she will stay up till midnight.  When taking 100 mg, around 630, she will lay on the couch, fall asleep. She seems to do well during the day, up until 6:30 pm. 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  In the last several months, she has had Achilles tendon reconstruction, and 2 breast lumpectomies.  In the last year, she has gained about 10 pounds.  She is not sure if her increased drowsiness, is related to deconditioning or weight gain?  She would like to try dose modification.  She presents today for evaluation unaccompanied. ESS was 12.  HISTORY 02/20/2019 SS: Ms. Misty Curtis is a 60 year old female with a history of idiopathic hypersomnia.  She is currently taking Provigil 200 mg daily however she is only taking half tablet.  She reports she is tolerating the medication well.  She will take it around 6:30 AM and she reports benefit until about 9:30 PM whenever she goes to bed.  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  She reports she is able to do her job well without significant fatigue.  She returns today  requesting a refill on her Provigil.  She presents today for evaluation unaccompanied.  She denies any new problems or concerns.  She is wearing a boot to her right foot that is healing from a prior injury.  REVIEW OF SYSTEMS: Out of a complete 14 system review of symptoms, the patient complains only of the following symptoms, and all other reviewed systems are negative.  Daytime drowsiness  ALLERGIES: Allergies  Allergen Reactions  . Latex Other (See Comments)  . Lactose Intolerance (Gi) Diarrhea  . Milk-Related Compounds Diarrhea  . Neomycin-Bacitracin Zn-Polymyx     REACTION: rash  . Onion     Also allergic to peppers and hot peppers and dairy   . Strawberry Extract   . Wixela Inhub [Fluticasone-Salmeterol]     Per patient - caused throat to close and laryngitis    HOME MEDICATIONS: Outpatient Medications Prior to Visit  Medication Sig Dispense Refill  . albuterol (PROVENTIL HFA;VENTOLIN HFA) 108 (90 Base) MCG/ACT inhaler Inhale 2 puffs into the lungs every 6 (six) hours as needed for wheezing or shortness of breath. 1 Inhaler 12  . Ascorbic Acid (VITAMIN C) 500 MG tablet Take 500 mg by mouth daily.    . B Complex Vitamins (VITAMIN B COMPLEX PO) Take by mouth.    Marland Kitchen BIOTENE DRY MOUTH (BIOTENE) LIQD Place 1 application onto teeth as needed.    . budesonide-formoterol (SYMBICORT)  160-4.5 MCG/ACT inhaler INHALE 2 PUFFS INTO THE LUNGS TWICE DAILY. RINSE MOUTH AFTER USE 10.2 g 12  . Cholecalciferol (VITAMIN D) 2000 units CAPS Take by mouth.    . clotrimazole-betamethasone (LOTRISONE) cream APPLY EXTERNALLY TO THE AFFECTED AREA TWICE DAILY 30 g prn  . diclofenac sodium (VOLTAREN) 1 % GEL 3 grams to 3 large joints up to 3 times daily 3 Tube 3  . Digestive Enzymes (DIGESTIVE ENZYME PO) Take by mouth.    . fluticasone (FLONASE) 50 MCG/ACT nasal spray SHAKE LIQUID AND USE 2 SPRAYS IN EACH NOSTRIL DAILY 16 g 3  . folic acid (FOLVITE) 1 MG tablet Take 1 mg by mouth daily.    .  hydroxychloroquine (PLAQUENIL) 200 MG tablet TAKE 1 TABLET BY MOUTH EVERY MORNING THEN TAKE 1/2 TABLET BY MOUTH IN THE EVENING 135 tablet 0  . lactase (LACTAID) 3000 UNITS tablet Take by mouth as needed.     Marland Kitchen lisinopril (ZESTRIL) 20 MG tablet Take 10 mg by mouth every morning.    . loratadine (CLARITIN) 10 MG tablet Take 10 mg by mouth daily.    . methotrexate (RHEUMATREX) 2.5 MG tablet TAKE 5 TABLETS BY MOUTH 1 TIME A WEEK 60 tablet 0  . modafinil (PROVIGIL) 100 MG tablet Take 1.5 tablets (150 mg total) by mouth daily. 42 tablet 3  . montelukast (SINGULAIR) 10 MG tablet TAKE 1 TABLET BY MOUTH EVERY DAY 30 tablet 6  . mupirocin ointment (BACTROBAN) 2 % APPLY EXTERNALLY TO THE AFFECTED AREA TWICE DAILY AS DIRECTED 22 g 12  . NON FORMULARY Doterra Essential Oils:  Eucalyptus, [place], [place], [place], [place], [employer]    . ondansetron (ZOFRAN) 4 MG tablet TAKE 1 TABLET(4 MG) BY MOUTH EVERY 6 HOURS AS NEEDED FOR NAUSEA 30 tablet 0  . phenylephrine (SUDAFED PE) 10 MG TABS tablet Take 10 mg by mouth every 4 (four) hours as needed.    Marland Kitchen Specialty Vitamins Products (CENTRUM PERFORMANCE) TABS Take 1 tablet by mouth daily.    Marland Kitchen SYNTHROID 75 MCG tablet Take 75 mcg by mouth every morning.    Marland Kitchen oxyCODONE (ROXICODONE) 5 MG immediate release tablet Take 1-2 tablets (5-10 mg total) by mouth every 4 (four) hours as needed. 50 tablet 0   No facility-administered medications prior to visit.    PAST MEDICAL HISTORY: Past Medical History:  Diagnosis Date  . Allergic rhinitis    skin test 01-18-10  . Bronchitis   . Lupus (systemic lupus erythematosus) ([facility])    joints and skin rash. Dr. Estanislado Pandy  . Melanoma ([place])   . Rhinosinusitis   . Skin cancer     PAST SURGICAL HISTORY: Past Surgical History:  Procedure Laterality Date  . ABDOMINAL HYSTERECTOMY    . ACHILLES TENDON REPAIR Right    Dr. Durward Fortes   . BREAST LUMPECTOMY Left    08/21/2019, 09/20/2019  . KNEE ARTHROPLASTY    . skin  cancer extraction      FAMILY HISTORY: Family History  Problem Relation Age of Onset  . Allergies Father   . Heart disease Father        bacterial endocarditis  . Cancer Maternal Grandmother   . Hypothyroidism Sister   . Hypothyroidism Sister   . Hashimoto's thyroiditis Sister   . Hepatitis Son     SOCIAL HISTORY: Social History   Socioeconomic History  . Marital status: Married    Spouse name: Not on file  . Number of children: 3  . Years of education: Not  on file  . Highest education level: Not on file  Occupational History  . 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 Smoking status: Never Smoker  . Smokeless tobacco: Never Used  Vaping Use  . Vaping Use: Never used  Substance and Sexual Activity  . Alcohol use: Yes    Comment: 1 MONTHLY  . Drug use: No  . Sexual activity: Not on file  Other Topics Concern  . Not on file  Social History Narrative  . Not on file   Social Determinants of Health   Financial Resource Strain: Not on file  Food Insecurity: Not on file  Transportation Needs: Not on file  Physical Activity: Not on file  Stress: Not on file  Social Connections: Not on file  Intimate Partner Violence: Not on file   PHYSICAL EXAM  Vitals:   02/25/21 1505  BP: (!) 170/91  Pulse: 75  Weight: 202 lb (91.6 kg)  Height: 5\' 3"  (1.6 m)   Body mass index is 35.78 kg/m.  Generalized: Well developed, in no acute distress  Neurological examination  Mentation: Alert oriented to time, place, history taking. Follows all commands speech and language fluent Cranial nerve II-XII: Pupils were equal round reactive to light. Extraocular movements were full, visual field were full on confrontational test. Facial sensation and strength were normal. Head turning and shoulder shrug were normal and symmetric. Motor: The motor testing reveals 5 over 5 strength of all 4 extremities. Good symmetric motor tone is noted throughout.  Sensory:  Sensory testing is intact to soft touch on all 4 extremities. No evidence of extinction is noted.  Coordination: Cerebellar testing reveals good finger-nose-finger and heel-to-shin bilaterally.  Gait and station: Gait is normal.  Reflexes: Deep tendon reflexes are symmetric and normal bilaterally.   DIAGNOSTIC DATA (LABS, IMAGING, TESTING) - I reviewed patient records, labs, notes, testing and imaging myself where available.  Lab Results  Component Value Date   WBC 7.9 01/06/2021   HGB 15.0 01/06/2021   HCT 44.0 01/06/2021   MCV 89.1 01/06/2021   PLT 329 01/06/2021      Component Value Date/Time   NA 143 01/06/2021 0935   K 4.0 01/06/2021 0935   CL 104 01/06/2021 0935   CO2 31 01/06/2021 0935   GLUCOSE 100 (H) 01/06/2021 0935   BUN 14 01/06/2021 0935   CREATININE 0.85 01/06/2021 0935   CALCIUM 9.7 01/06/2021 0935   PROT 6.8 01/06/2021 0935   ALBUMIN 4.1 06/02/2017 0945   AST 27 01/06/2021 0935   ALT 17 01/06/2021 0935   ALKPHOS 82 06/02/2017 0945   BILITOT 0.3 01/06/2021 0935   GFRNONAA 74 01/06/2021 0935   GFRAA 86 01/06/2021 0935   No results found for: CHOL, HDL, LDLCALC, LDLDIRECT, TRIG, CHOLHDL No results found for: HGBA1C No results found for: VITAMINB12 No results found for: TSH  ASSESSMENT AND PLAN 60 y.o. year old female  has a past medical history of Allergic rhinitis, Bronchitis, Lupus (systemic lupus erythematosus) ([facility]), Melanoma ([facility]), Rhinosinusitis, and Skin cancer. here with:  1.  Idiopathic hypersomnia   -Overall, doing well, symptoms well controlled -Continue Provigil 150 mg daily, due for refill in April, let me know when needed -Follow-up in 1 year or sooner if needed.  I spent 20 minutes of face-to-face and non-face-to-face time with patient.  This included previsit chart review, lab review, study review, order entry, electronic health record documentation, patient education.  Butler Denmark, AGNP-C, DNP 02/25/2021, 3:39 PM [facility]  [address], [place] [place], [place] [address] [phone]

## 2021-02-25 NOTE — Patient Instructions (Signed)
Continue the provigil at current dosing See you back in 1 year  Let me know when you need a refill

## 2021-03-04 ENCOUNTER — Ambulatory Visit: Payer: BC Managed Care – PPO | Admitting: Rheumatology

## 2021-03-04 ENCOUNTER — Encounter: Payer: Self-pay | Admitting: Rheumatology

## 2021-03-04 ENCOUNTER — Other Ambulatory Visit: Payer: Self-pay

## 2021-03-04 VITALS — BP 173/94 | HR 80 | Resp 15 | Ht 63.0 in | Wt 202.0 lb

## 2021-03-04 DIAGNOSIS — I7300 Raynaud's syndrome without gangrene: Secondary | ICD-10-CM

## 2021-03-04 DIAGNOSIS — Z13820 Encounter for screening for osteoporosis: Secondary | ICD-10-CM

## 2021-03-04 DIAGNOSIS — M359 Systemic involvement of connective tissue, unspecified: Secondary | ICD-10-CM | POA: Diagnosis not present

## 2021-03-04 DIAGNOSIS — Z79899 Other long term (current) drug therapy: Secondary | ICD-10-CM | POA: Diagnosis not present

## 2021-03-04 DIAGNOSIS — G5601 Carpal tunnel syndrome, right upper limb: Secondary | ICD-10-CM

## 2021-03-04 DIAGNOSIS — Z8669 Personal history of other diseases of the nervous system and sense organs: Secondary | ICD-10-CM

## 2021-03-04 DIAGNOSIS — Z8679 Personal history of other diseases of the circulatory system: Secondary | ICD-10-CM

## 2021-03-04 DIAGNOSIS — Z8709 Personal history of other diseases of the respiratory system: Secondary | ICD-10-CM

## 2021-03-04 DIAGNOSIS — Z85820 Personal history of malignant melanoma of skin: Secondary | ICD-10-CM

## 2021-03-04 DIAGNOSIS — Z87898 Personal history of other specified conditions: Secondary | ICD-10-CM

## 2021-03-04 DIAGNOSIS — M6788 Other specified disorders of synovium and tendon, other site: Secondary | ICD-10-CM

## 2021-03-04 DIAGNOSIS — R609 Edema, unspecified: Secondary | ICD-10-CM

## 2021-03-04 DIAGNOSIS — M7661 Achilles tendinitis, right leg: Secondary | ICD-10-CM

## 2021-03-04 DIAGNOSIS — Z8639 Personal history of other endocrine, nutritional and metabolic disease: Secondary | ICD-10-CM

## 2021-03-04 NOTE — Patient Instructions (Signed)
Standing Labs We placed an order today for your standing lab work.   Please have your standing labs drawn in June  If possible, please have your labs drawn 2 weeks prior to your appointment so that the provider can discuss your results at your appointment.  We have open lab daily Monday through Thursday from 1:30-4:30 PM and Friday from 1:30-4:00 PM at the office of Dr. Bo Merino, [facility].   Please be advised, all patients with office appointments requiring lab work will take precedents over walk-in lab work.  If possible, please come for your lab work on Monday and Friday afternoons, as you may experience shorter wait times. The office is located at [address], [place], [place], [place] [address] No appointment is necessary.   Labs are drawn by [facility]. Please bring your co-pay at the time of your lab draw.  You may receive a bill from [facility] for your lab work.  If you wish to have your labs drawn at another location, please call the office 24 hours in advance to send orders.  If you have any questions regarding directions or hours of operation,  please call [phone].   As a reminder, please drink plenty of water prior to coming for your lab work. Thanks!   Vaccines You are taking a medication(s) that can suppress your immune system.  The following immunizations are recommended: . Flu annually . Covid-19  . Pneumonia (Pneumovax 23 and Prevnar 13 spaced at least 1 year apart) . Shingrix (after age 7)  Please check with your PCP to make sure you are up to date.   Heart Disease Prevention   Your inflammatory disease increases your risk of heart disease which includes heart attack, stroke, atrial fibrillation (irregular heartbeats), high blood pressure, heart failure and atherosclerosis (plaque in the arteries).  It is important to reduce your risk by:   . Keep blood pressure, cholesterol, and blood sugar at healthy levels   . Smoking Cessation    . Maintain a healthy weight  o BMI 20-25   . Eat a healthy diet  o Plenty of fresh fruit, vegetables, and whole grains  o Limit saturated fats, foods high in sodium, and added sugars  o DASH and Mediterranean diet   . Increase physical activity  o Recommend moderate physically activity for 150 minutes per week/ 30 minutes a day for five days a week These can be broken up into three separate ten-minute sessions during the day.   . Reduce Stress  . Meditation, slow breathing exercises, yoga, coloring books  . Dental visits twice a year

## 2021-03-25 ENCOUNTER — Other Ambulatory Visit: Payer: Self-pay | Admitting: *Deleted

## 2021-03-25 DIAGNOSIS — G4719 Other hypersomnia: Secondary | ICD-10-CM

## 2021-03-25 MED ORDER — MODAFINIL 100 MG PO TABS: 150.0000 mg | ORAL_TABLET | Freq: Every day | ORAL | 3 refills | Status: DC

## 2021-04-12 ENCOUNTER — Other Ambulatory Visit: Payer: Self-pay | Admitting: Radiology

## 2021-04-12 ENCOUNTER — Other Ambulatory Visit: Payer: Self-pay | Admitting: Rheumatology

## 2021-04-12 DIAGNOSIS — Z79899 Other long term (current) drug therapy: Secondary | ICD-10-CM

## 2021-04-12 NOTE — Telephone Encounter (Signed)
Will call patient to advise labs are due. 

## 2021-04-12 NOTE — Telephone Encounter (Signed)
Next Visit:  08/05/2021  Last Visit: 03/04/2021  Last Fill: 02/08/2021  DX:  Autoimmune disease  Current Dose per office note 03/04/2021, MTX 5 tablets by mouth once a week  Labs: 01/06/2021, CBC and CMP WNL  Will call patient to advise labs are due.  Okay to refill MTX?

## 2021-04-12 NOTE — Telephone Encounter (Signed)
LMOM labs are due. 

## 2021-04-13 ENCOUNTER — Encounter: Payer: Self-pay | Admitting: Rheumatology

## 2021-04-13 NOTE — Progress Notes (Signed)
White cell count is mildly elevated, glucose is mildly elevated probably not a fasting sample.  Creatinine is mildly elevated.  We will continue to monitor labs.

## 2021-04-14 NOTE — Telephone Encounter (Signed)
Blood sugar is mildly elevated, probably not a fasting sample.  White cell count is also mildly elevated.  If there are no signs of infection then we will continue to monitor.

## 2021-04-19 LAB — COMPLETE METABOLIC PANEL WITH GFR
AG Ratio: 1.7 (calc) (ref 1.0–2.5)
ALT: 13 U/L (ref 6–29)
AST: 20 U/L (ref 10–35)
Albumin: 4 g/dL (ref 3.6–5.1)
Alkaline phosphatase (APISO): 92 U/L (ref 37–153)
BUN/Creatinine Ratio: 17 (calc) (ref 6–22)
BUN: 17 mg/dL (ref 7–25)
CO2: 28 mmol/L (ref 20–32)
Calcium: 9.3 mg/dL (ref 8.6–10.4)
Chloride: 105 mmol/L (ref 98–110)
Creat: 1.01 mg/dL — ABNORMAL HIGH (ref 0.50–0.99)
GFR, Est African American: 70 mL/min/{1.73_m2} (ref >=60)
GFR, Est Non African American: 60 mL/min/{1.73_m2} (ref >=60)
Globulin: 2.3 g/dL (calc) (ref 1.9–3.7)
Glucose, Bld: 125 mg/dL — ABNORMAL HIGH (ref 65–99)
Potassium: 3.8 mmol/L (ref 3.5–5.3)
Sodium: 141 mmol/L (ref 135–146)
Total Bilirubin: 0.3 mg/dL (ref 0.2–1.2)
Total Protein: 6.3 g/dL (ref 6.1–8.1)

## 2021-04-19 LAB — CBC WITH DIFFERENTIAL/PLATELET
Absolute Monocytes: 916 cells/uL (ref 200–950)
Basophils Absolute: 52 cells/uL (ref 0–200)
Basophils Relative: 0.4 %
Eosinophils Absolute: 206 cells/uL (ref 15–500)
Eosinophils Relative: 1.6 %
HCT: 42.2 % (ref 35.0–45.0)
Hemoglobin: 14.1 g/dL (ref 11.7–15.5)
Lymphs Abs: 3238 cells/uL (ref 850–3900)
MCH: 30.1 pg (ref 27.0–33.0)
MCHC: 33.4 g/dL (ref 32.0–36.0)
MCV: 90 fL (ref 80.0–100.0)
MPV: 10 fL (ref 7.5–12.5)
Monocytes Relative: 7.1 %
Neutro Abs: 8488 cells/uL — ABNORMAL HIGH (ref 1500–7800)
Neutrophils Relative %: 65.8 %
Platelets: 297 10*3/uL (ref 140–400)
RBC: 4.69 10*6/uL (ref 3.80–5.10)
RDW: 12.9 % (ref 11.0–15.0)
Total Lymphocyte: 25.1 %
WBC: 12.9 10*3/uL — ABNORMAL HIGH (ref 3.8–10.8)

## 2021-04-19 LAB — ANA: Anti Nuclear Antibody (ANA): POSITIVE — AB

## 2021-04-19 LAB — TEST AUTHORIZATION

## 2021-04-19 LAB — ANTI-NUCLEAR AB-TITER (ANA TITER): ANA Titer 1: 1:40 {titer} — ABNORMAL HIGH

## 2021-04-19 LAB — ANTI-DNA ANTIBODY, DOUBLE-STRANDED: ds DNA Ab: 1 IU/mL

## 2021-04-19 NOTE — Progress Notes (Signed)
ANA is low titer positive and double-stranded DNA is negative.

## 2021-04-26 ENCOUNTER — Other Ambulatory Visit: Payer: Self-pay | Admitting: Rheumatology

## 2021-04-26 DIAGNOSIS — M359 Systemic involvement of connective tissue, unspecified: Secondary | ICD-10-CM

## 2021-04-26 NOTE — Telephone Encounter (Signed)
Last Visit: 03/04/2021 Next Visit: 08/05/2021 Labs: 04/12/2021, White cell count is mildly elevated, glucose is mildly elevated probably not a fasting sample. Creatinine is mildly elevated. We will continue to monitor labs. ANA is low titer positive and double-stranded DNA is negative. Eye exam: 04/14/2021  Current Dose per office note 03/04/2021, PLQ 200 mg 1 tablet in the morning and 1/2 tablet in the evening,  NP:[id number] disease   Last Fill: 02/08/2021  Okay to refill Plaquenil?

## 2021-04-29 ENCOUNTER — Other Ambulatory Visit: Payer: Self-pay | Admitting: Physician Assistant

## 2021-04-29 DIAGNOSIS — M359 Systemic involvement of connective tissue, unspecified: Secondary | ICD-10-CM

## 2021-04-29 NOTE — Telephone Encounter (Signed)
Please check on the status of the prescription refill.

## 2021-05-04 ENCOUNTER — Other Ambulatory Visit: Payer: Self-pay | Admitting: *Deleted

## 2021-05-04 DIAGNOSIS — M359 Systemic involvement of connective tissue, unspecified: Secondary | ICD-10-CM

## 2021-05-05 LAB — URINALYSIS, ROUTINE W REFLEX MICROSCOPIC
Bilirubin Urine: NEGATIVE
Glucose, UA: NEGATIVE
Hgb urine dipstick: NEGATIVE
Ketones, ur: NEGATIVE
Leukocytes,Ua: NEGATIVE
Nitrite: NEGATIVE
Protein, ur: NEGATIVE
Specific Gravity, Urine: 1.008 (ref 1.001–1.035)
pH: 7 (ref 5.0–8.0)

## 2021-05-05 LAB — C3 AND C4
C3 Complement: 144 mg/dL (ref 83–193)
C4 Complement: 28 mg/dL (ref 15–57)

## 2021-05-05 LAB — SEDIMENTATION RATE: Sed Rate: 2 mm/h (ref 0–30)

## 2021-05-05 NOTE — Progress Notes (Signed)
UA negative, complements normal, sed rate normal

## 2021-05-21 ENCOUNTER — Encounter: Payer: Self-pay | Admitting: Rheumatology

## 2021-05-21 MED ORDER — FOLIC ACID 1 MG PO TABS: 1.0000 mg | ORAL_TABLET | Freq: Every day | ORAL | 3 refills | Status: DC

## 2021-05-21 NOTE — Telephone Encounter (Signed)
Next Visit: 08/05/2021  Last Visit: 03/04/2021  Last Fill: 03/15/2019  Dx: Autoimmune disease   Current Dose per office note on 08/20/4096, folic acid 1 mg daily  Okay to refill folic acid?

## 2021-06-09 ENCOUNTER — Ambulatory Visit: Payer: BC Managed Care – PPO | Admitting: Internal Medicine

## 2021-06-12 ENCOUNTER — Encounter: Payer: Self-pay | Admitting: Rheumatology

## 2021-06-12 ENCOUNTER — Other Ambulatory Visit: Payer: Self-pay | Admitting: Rheumatology

## 2021-06-14 MED ORDER — ONDANSETRON HCL 4 MG PO TABS: 4.0000 mg | ORAL_TABLET | Freq: Four times a day (QID) | ORAL | 0 refills | Status: DC | PRN

## 2021-06-14 NOTE — Telephone Encounter (Signed)
Last Visit: 03/04/2021  Next Visit: 08/05/2021  Okay to refill Zofran?

## 2021-06-30 NOTE — Progress Notes (Signed)
Office Visit Note  Patient: Misty Curtis             Date of Birth: [birth year]           MRN: [id number]             PCP: Philmore Pali, NP Referring: Philmore Pali, NP Visit Date: 07/14/2021 Occupation: _0 @  Subjective:  Fatigue   History of Present Illness: Misty Curtis is a 60 y.o. female with history of autoimmune disease.  Patient is taking Plaquenil 200 mg 1 tablet in the morning and half tablet in the evening, methotrexate 5 tablets by mouth once weekly, and folic acid 1 mg by mouth daily.  She has not missed any doses of Plaquenil or methotrexate recently.  She denies any signs or symptoms of a flare.  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  She has been in [place] caring for her mother for the past 6 weeks but she has come home to go to several provider visits including her allergist and dermatologist.  She denies any recent rashes or increased photosensitivity.  She has noticed some increased hair thinning but attributed to increased stress.  She has intermittent symptoms of Raynaud's in her feet but denies any ulcerations.  She denies any increased shortness of breath, palpitations, or pleuritic chest pain.  She has not had any fevers or swollen lymph nodes.  About 2 weeks ago she had several mouth sores which have since resolved.  She denies any nasal ulcerations.  She has ongoing sicca symptoms which have been tolerable overall. She is having some discomfort due to trochanter bursitis of the left hip.  She has been trying to perform stretching exercises on a regular basis.  She states that she still has some tightness in her left ankle after Achilles tendon repair performed by Dr. Durward Fortes.  She has been cleared by Dr. Durward Fortes and has continued home exercises.  She denies any joint swelling at this time.      Activities of Daily Living:  Patient reports morning stiffness for 20-30  minutes.   Patient Denies nocturnal pain.  Difficulty dressing/grooming: Denies Difficulty climbing stairs: Reports Difficulty getting out of chair: Denies Difficulty using hands for taps, buttons, cutlery, and/or writing: Reports  Review of Systems  Constitutional:  Positive for fatigue.  HENT:  Positive for mouth dryness and nose dryness. Negative for mouth sores.   Eyes:  Positive for pain, itching and dryness.  Respiratory:  Negative for shortness of breath and difficulty breathing.   Cardiovascular:  Negative for chest pain and palpitations.  Gastrointestinal:  Negative for blood in stool, constipation and diarrhea.  Endocrine: Negative for increased urination.  Genitourinary:  Negative for difficulty urinating.  Musculoskeletal:  Positive for morning stiffness. Negative for joint pain, joint pain, joint swelling, myalgias, muscle tenderness and myalgias.  Skin:  Negative for color change, rash and redness.  Allergic/Immunologic: Positive for susceptible to infections.  Neurological:  Negative for dizziness, numbness, headaches and memory loss.  Hematological:  Positive for bruising/bleeding tendency.  Psychiatric/Behavioral:  Negative for confusion.    PMFS History:  Patient Active Problem List   Diagnosis Date Noted   Heel pain, chronic, left 09/30/2020   Achilles tendinitis, left leg 08/19/2020   Heel pain, chronic, right 05/02/2019   Thrush 06/11/2018   Acquired hypothyroidism 12/08/2016   Autoimmune disease ([place]) 12/06/2016   Other fatigue 12/06/2016  Raynaud's disease without gangrene 12/06/2016   High risk medication use 12/06/2016   Allergic asthma, mild intermittent, uncomplicated 16/09/9603   History of hypertension 12/06/2016   Excessive daytime sleepiness 07/26/2016   Snoring 07/26/2016   Laryngitis 11/10/2012   Postnasal drip 05/02/2012   Acute sinusitis, unspecified 10/24/2011   HYPERTENSION 12/14/2009   BRONCHITIS 12/14/2009   LUPUS 12/14/2009    History of melanoma 12/14/2009   Seasonal and perennial allergic rhinitis 12/14/2009    Past Medical History:  Diagnosis Date   Allergic rhinitis    skin test 01-18-10   Bronchitis    Lupus (systemic lupus erythematosus) (HCC)    joints and skin rash. Dr. Estanislado Pandy   Melanoma [facility])    Rhinosinusitis    Skin cancer     Family History  Problem Relation Age of Onset   Stroke Mother    Allergies Father    Heart disease Father        bacterial endocarditis   Hypothyroidism Sister    Hypothyroidism Sister    Hashimoto's thyroiditis Sister    Cancer Maternal Grandmother    Hepatitis Son    Past Surgical History:  Procedure Laterality Date   ABDOMINAL HYSTERECTOMY     ACHILLES TENDON REPAIR Bilateral    Dr. Durward Fortes    BREAST LUMPECTOMY Left    08/21/2019, 09/20/2019   KNEE ARTHROPLASTY     skin cancer extraction     Social History   Social History Narrative   Not on file   Immunization History  Administered Date(s) Administered   Influenza Split 09/19/2011, 09/12/2012, 09/02/2013   Influenza-Unspecified 10/19/2014   Moderna Sars-Covid-2 Vaccination 02/26/2020, 03/25/2020, 12/09/2020   Pneumococcal Polysaccharide-23 08/19/2010   Pneumococcal-Unspecified 07/01/2020     Objective: Vital Signs: BP (!) 145/85 (BP Location: Left Arm, Patient Position: Sitting, Cuff Size: Normal)   Pulse 78   Ht _0  (1.6 m)   Wt 195 lb 3.2 oz (88.5 kg)   BMI 34.58 kg/m    Physical Exam Vitals and nursing note reviewed.  Constitutional:      Appearance: She is well-developed.  HENT:     Head: Normocephalic and atraumatic.  Eyes:     Conjunctiva/sclera: Conjunctivae normal.  Pulmonary:     Effort: Pulmonary effort is normal.  Abdominal:     Palpations: Abdomen is soft.  Musculoskeletal:     Cervical back: Normal range of motion.  Skin:    General: Skin is warm and dry.     Capillary Refill: Capillary refill takes 2 to 3 seconds.  Neurological:     Mental Status: She is alert  and oriented to person, place, and time.  Psychiatric:        Behavior: Behavior normal.     Musculoskeletal Exam: C-spine, thoracic spine, lumbar spine have good range of motion with no discomfort.  Shoulder joints, elbow joints, wrist joints, MCPs, PIPs, DIPs have good range of motion with no synovitis.  Some tenderness over the dorsal aspect of the right wrist noted.  Complete fist formation bilaterally.  Hip joints have good range of motion with no discomfort.  Tenderness over the left trochanteric bursa noted.  Knee joints have good range of motion with mild warmth in the right knee but no effusion noted.  Some limited dorsiflexion of the left ankle noted.  Right ankle has good range of motion with no discomfort.  No tenderness over MTP joints.  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  CDAI Exam: CDAI  Score: -- Patient Global: --; Provider Global: -- Swollen: --; Tender: -- Joint Exam 07/14/2021   No joint exam has been documented for this visit   There is currently no information documented on the homunculus. Go to the Rheumatology activity and complete the homunculus joint exam.  Investigation: No additional findings.  Imaging: No results found.  Recent Labs: Lab Results  Component Value Date   WBC 12.9 (H) 04/12/2021   HGB 14.1 04/12/2021   PLT 297 04/12/2021   NA 141 04/12/2021   K 3.8 04/12/2021   CL 105 04/12/2021   CO2 28 04/12/2021   GLUCOSE 125 (H) 04/12/2021   BUN 17 04/12/2021   CREATININE 1.01 (H) 04/12/2021   BILITOT 0.3 04/12/2021   ALKPHOS 82 06/02/2017   AST 20 04/12/2021   ALT 13 04/12/2021   PROT 6.3 04/12/2021   ALBUMIN 4.1 06/02/2017   CALCIUM 9.3 04/12/2021   GFRAA 70 04/12/2021    Speciality Comments: PLQ eye exam: 04/14/2021 WNL at Dr Myna Hidalgo. Hulan Saas, OD Follow up in 1 year  Patient having PLQ eye exam 04/15/2021  Procedures:  No procedures performed Allergies: Latex, Lactose intolerance (gi), Milk-related  compounds, Neomycin-bacitracin zn-polymyx, Onion, Strawberry extract, and Wixela inhub [fluticasone-salmeterol]   Assessment / Plan:     Visit Diagnoses: Autoimmune disease ([place]) - History of positive ANA, DS DNA, Raynauds, fatigue, oral ulcers, nasal ulcers, arthralgias, sicca: She has not had any signs or symptoms of a flare recently.  She has clinically been doing well taking Plaquenil 200 mg 1 tablet in the morning half tablet in evening, methotrexate 5 tablets by mouth once weekly, and folic acid 1 mg by mouth daily.  She is tolerating these medications without any side effects.  She has no synovitis on examination today.  She continues to have chronic sicca symptoms and intermittent oral ulcerations.  She has not had any cervical lymphadenopathy or low-grade fevers recently.  She has intermittent symptoms of Raynaud's in both feet but no ulcerations were noted.  She has not had any recent rashes or increased photosensitivity.  We discussed the importance of wearing sunscreen SPF greater than 50 on a daily basis and reapplying every 2 hours while outdoors.  She has not had any shortness of breath, pleuritic chest pain, or palpitations.  Lab work from 04/12/2021 was reviewed today in the office: ANA 1: 40 NH, double-stranded DNA negative, complements within normal limits, ESR within normal limits, and no proteinuria.  We will recheck the following lab work today.  She will remain on the current treatment regimen.  She was advised to notify us if she develops any new or worsening symptoms.  She will follow up in 5 months. - Plan: Protein / creatinine ratio, urine, CBC with Differential/Platelet, COMPLETE METABOLIC PANEL WITH GFR, Anti-DNA antibody, double-stranded, C3 and C4, Sedimentation rate  High risk medication use - Plaquenil 200 mg 1 tablet in the morning and 1/2 tablet in the evening, Methotrexate 5 tablets by mouth once a week, and folic acid 1 mg daily. PLQ eye exam: 04/14/2021.  CBC and CMP were  drawn on 04/12/2021.  We will update CBC and CMP today.  Her next lab work will be due at the end of October and every 3 months to monitor for drug toxicity.- Plan: CBC with Differential/Platelet, COMPLETE METABOLIC PANEL WITH GFR  Raynaud's disease without gangrene: She experiences intermittent symptoms of Raynaud's in both feet.  No ulcerations were noted.  No skin tightness or thickening noted.  Capillary refill was  2 to 3 seconds in her fingers.  Right carpal tunnel syndrome: Asymptomatic at this time.  Achilles tendinitis of right lower extremity - Resolved. She had successful insertional achilles surgery performed by Dr. Durward Fortes in the past.  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  Surgically repaired by Dr. Durward Fortes.  She continues to have some stiffness and performs home exercises.  She continues to have some difficulty going down steps.  History of melanoma - She continues to follow-up with her dermatologist as recommended.  She has not gone appointment next week.  We discussed the importance of wearing sunscreen SPF greater than 50 on a daily basis and to try to avoid direct sun exposure.  Other medical conditions are listed as follows:  History of hypothyroidism  History of asthma  History of fatigue  History of hypertension  History of sleep apnea  Pitting edema  Osteoporosis screening - DEXA ordered by PCP-normal according to the patient.   Orders: Orders Placed This Encounter  Procedures   Protein / creatinine ratio, urine   CBC with Differential/Platelet   COMPLETE METABOLIC PANEL WITH GFR   Anti-DNA antibody, double-stranded   C3 and C4   Sedimentation rate   Meds ordered this encounter  Medications   methotrexate (RHEUMATREX) 2.5 MG tablet    Sig: Take 5 tablets by mouth once weekly.  Caution:Chemotherapy. Protect from light.    Dispense:  60 tablet    Refill:  0     Follow-Up Instructions: Return in about 5 months (around 12/14/2021) for Autoimmune Disease.   Ofilia Neas, PA-C  Note - This record has been created using Dragon software.  Chart creation errors have been sought, but may not always  have been located. Such creation errors do not reflect on  the standard of medical care.

## 2021-07-01 ENCOUNTER — Other Ambulatory Visit: Payer: Self-pay | Admitting: Internal Medicine

## 2021-07-12 NOTE — Progress Notes (Signed)
Patient ID: Misty Curtis, female    DOB: [birth year], 60 y.o.   MRN: [id number]  HPI female never smoker followed for allergy, bronchitis, complicated by HBP, lupus, Somnolence( [place])  --------------------------------------------------- 06/09/20- 60 yo female ([employer]) never smoker followed for allergy, bronchitis, complicated by HBP, lupus, Somnolence ([place]), Hypothyroid,  Had 2 Moderna Covax -----doing well, good days and bad days. 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 Has used rescue 2x/ 11 months. Nasal stuffiness frequent, blows clear/ yellow- no HA, no blood. Uses flonase regularly.  07/13/21- 60 yo female ([employer] person) never smoker followed for allergy, bronchitis, complicated by HBP, lupus, Somnolence ([place]), Hypothyroid,  -Singulair, Symbicort 160, albuterol hfa     MTX, Provigil Covid vax- 3 Moderna ACT score-24 Wheezed some yesterday washing car in the heat. Away from home several months. On re-introduction to her cat she noted nasal congestion. If flonase insufficient we can refer to allergy.  Review of Systems-see HPI  + = positive Constitutional:   No-   weight loss, night sweats, fevers, chills, fatigue, lassitude. HEENT:   No-  headaches, difficulty swallowing, tooth/dental problems, sore throat,       No-  sneezing, itching, ear ache, +nasal congestion, +post nasal drip,  CV:  No-   chest pain, orthopnea, PND, swelling in lower extremities, anasarca, dizziness, palpitations Resp: No-   shortness of breath with exertion or at rest.              No-   productive cough,  No non-productive cough,  No- coughing up of blood.              No-   change in color of mucus.  Little wheezing.   Skin: +rash or lesions. GI:  No-   heartburn, indigestion, abdominal pain, nausea, vomiting,  GU:  MS:  No-   joint pain or  swelling.  Neuro-     nothing unusual Psych:  No- change in mood or affect. No depression or anxiety.  No memory loss.  Objective:   Physical Exam General- Alert, Oriented, Affect-appropriate, Distress- none acute, + overweight Skin- rash-none, lesions- none, excoriation- none Lymphadenopathy- none Head- atraumatic            Eyes- Gross vision intact, PERRLA, conjunctivae clear secretions            Ears- Hearing, canals-normal. TMs look normal            Nose- +turbinate hypertrophy, no-Septal dev, mucus, polyps, erosion, perforation             Throat- Mallampati II , mucosa + thrush, drainage- none, tonsils- atrophic.                    No visible postnasal drainage and minimal redness of her pharynx.                  Neck- flexible , trachea midline, no stridor , thyroid nl, carotid no bruit Chest - symmetrical excursion , unlabored           Heart/CV- RRR , no murmur , no gallop  , no rub, nl s1 s2                           - JVD- none , edema- none, stasis changes- none, varices- none  Lung- clear to P&A, wheeze- none, cough- none , dullness-none, rub- none           Chest wall-  Abd-  Br/ Gen/ Rectal- Not done, not indicated Extrem- +R foot in boot Neuro- grossly intact to observation

## 2021-07-13 ENCOUNTER — Ambulatory Visit: Payer: BC Managed Care – PPO | Admitting: Internal Medicine

## 2021-07-13 ENCOUNTER — Other Ambulatory Visit: Payer: Self-pay

## 2021-07-13 ENCOUNTER — Encounter: Payer: Self-pay | Admitting: Internal Medicine

## 2021-07-13 DIAGNOSIS — J3089 Other allergic rhinitis: Secondary | ICD-10-CM | POA: Diagnosis not present

## 2021-07-13 DIAGNOSIS — J302 Other seasonal allergic rhinitis: Secondary | ICD-10-CM

## 2021-07-13 DIAGNOSIS — J4520 Mild intermittent asthma, uncomplicated: Secondary | ICD-10-CM

## 2021-07-13 MED ORDER — MUPIROCIN 2 % EX OINT: TOPICAL_OINTMENT | CUTANEOUS | 12 refills | Status: DC

## 2021-07-13 NOTE — Patient Instructions (Signed)
Bactroban refilled  Try Flonase/ fluticasone otc nasal spray   1 puff each nostril at bedtime for allergic nose Try to avoid really close contact with cat  Ok to continue routine meds  Please call if we can help

## 2021-07-13 NOTE — Telephone Encounter (Signed)
Dr. Annamaria Boots please advise on follow My Chart message:   Can you do an allergy test for cats while I am at my appointment on Tuesday 7/26?   Misty Curtis  FYI pt is currently in office for 0930 appointment with you.   Thank you.

## 2021-07-14 ENCOUNTER — Encounter: Payer: Self-pay | Admitting: Physician Assistant

## 2021-07-14 ENCOUNTER — Ambulatory Visit: Payer: BC Managed Care – PPO | Admitting: Physician Assistant

## 2021-07-14 VITALS — BP 145/85 | HR 78 | Ht 63.0 in | Wt 195.2 lb

## 2021-07-14 DIAGNOSIS — M6788 Other specified disorders of synovium and tendon, other site: Secondary | ICD-10-CM

## 2021-07-14 DIAGNOSIS — M359 Systemic involvement of connective tissue, unspecified: Secondary | ICD-10-CM

## 2021-07-14 DIAGNOSIS — G5601 Carpal tunnel syndrome, right upper limb: Secondary | ICD-10-CM | POA: Diagnosis not present

## 2021-07-14 DIAGNOSIS — Z8639 Personal history of other endocrine, nutritional and metabolic disease: Secondary | ICD-10-CM

## 2021-07-14 DIAGNOSIS — Z79899 Other long term (current) drug therapy: Secondary | ICD-10-CM

## 2021-07-14 DIAGNOSIS — Z8709 Personal history of other diseases of the respiratory system: Secondary | ICD-10-CM

## 2021-07-14 DIAGNOSIS — I7300 Raynaud's syndrome without gangrene: Secondary | ICD-10-CM | POA: Diagnosis not present

## 2021-07-14 DIAGNOSIS — Z87898 Personal history of other specified conditions: Secondary | ICD-10-CM

## 2021-07-14 DIAGNOSIS — M7661 Achilles tendinitis, right leg: Secondary | ICD-10-CM

## 2021-07-14 DIAGNOSIS — Z13820 Encounter for screening for osteoporosis: Secondary | ICD-10-CM

## 2021-07-14 DIAGNOSIS — Z85820 Personal history of malignant melanoma of skin: Secondary | ICD-10-CM

## 2021-07-14 DIAGNOSIS — R609 Edema, unspecified: Secondary | ICD-10-CM

## 2021-07-14 DIAGNOSIS — Z8669 Personal history of other diseases of the nervous system and sense organs: Secondary | ICD-10-CM

## 2021-07-14 DIAGNOSIS — Z8679 Personal history of other diseases of the circulatory system: Secondary | ICD-10-CM

## 2021-07-14 MED ORDER — METHOTREXATE 2.5 MG PO TABS: ORAL_TABLET | ORAL | 0 refills | Status: DC

## 2021-07-14 NOTE — Telephone Encounter (Signed)
Mychart message sent by pt: Dr. Annamaria Boots,   You prescribed for me to use Flonase at night.  I use Fluticasone Propionate 22mg every morning - 2 sprays in each nostril each morning. I did not make the connection until I got home.   Do you want me to continue with one or both Flonase? DTeodoro Kil     Dr. YAnnamaria Boots please advise.

## 2021-07-14 NOTE — Telephone Encounter (Signed)
It is the same stuff, no need to duplicate. Just continue what you are doing.

## 2021-07-15 LAB — COMPLETE METABOLIC PANEL WITH GFR
AG Ratio: 1.7 (calc) (ref 1.0–2.5)
ALT: 15 U/L (ref 6–29)
AST: 23 U/L (ref 10–35)
Albumin: 4 g/dL (ref 3.6–5.1)
Alkaline phosphatase (APISO): 86 U/L (ref 37–153)
BUN: 19 mg/dL (ref 7–25)
CO2: 28 mmol/L (ref 20–32)
Calcium: 9.5 mg/dL (ref 8.6–10.4)
Chloride: 105 mmol/L (ref 98–110)
Creat: 0.91 mg/dL (ref 0.50–1.05)
Globulin: 2.3 g/dL (calc) (ref 1.9–3.7)
Glucose, Bld: 100 mg/dL — ABNORMAL HIGH (ref 65–99)
Potassium: 4.1 mmol/L (ref 3.5–5.3)
Sodium: 140 mmol/L (ref 135–146)
Total Bilirubin: 0.3 mg/dL (ref 0.2–1.2)
Total Protein: 6.3 g/dL (ref 6.1–8.1)
eGFR: 72 mL/min/{1.73_m2} (ref >=60)

## 2021-07-15 LAB — CBC WITH DIFFERENTIAL/PLATELET
Absolute Monocytes: 516 cells/uL (ref 200–950)
Basophils Absolute: 39 cells/uL (ref 0–200)
Basophils Relative: 0.5 %
Eosinophils Absolute: 177 cells/uL (ref 15–500)
Eosinophils Relative: 2.3 %
HCT: 45.2 % — ABNORMAL HIGH (ref 35.0–45.0)
Hemoglobin: 15 g/dL (ref 11.7–15.5)
Lymphs Abs: 2533 cells/uL (ref 850–3900)
MCH: 30.2 pg (ref 27.0–33.0)
MCHC: 33.2 g/dL (ref 32.0–36.0)
MCV: 91.1 fL (ref 80.0–100.0)
MPV: 9.6 fL (ref 7.5–12.5)
Monocytes Relative: 6.7 %
Neutro Abs: 4435 cells/uL (ref 1500–7800)
Neutrophils Relative %: 57.6 %
Platelets: 304 10*3/uL (ref 140–400)
RBC: 4.96 10*6/uL (ref 3.80–5.10)
RDW: 13.5 % (ref 11.0–15.0)
Total Lymphocyte: 32.9 %
WBC: 7.7 10*3/uL (ref 3.8–10.8)

## 2021-07-15 LAB — PROTEIN / CREATININE RATIO, URINE
Creatinine, Urine: 98 mg/dL (ref 20–275)
Protein/Creat Ratio: 82 mg/g creat (ref 21–161)
Protein/Creatinine Ratio: 0.082 mg/mg creat (ref 0.021–0.161)
Total Protein, Urine: 8 mg/dL (ref 5–24)

## 2021-07-15 LAB — SEDIMENTATION RATE: Sed Rate: 2 mm/h (ref 0–30)

## 2021-07-15 LAB — C3 AND C4
C3 Complement: 147 mg/dL (ref 83–193)
C4 Complement: 27 mg/dL (ref 15–57)

## 2021-07-15 LAB — ANTI-DNA ANTIBODY, DOUBLE-STRANDED: ds DNA Ab: 1 IU/mL

## 2021-07-15 NOTE — Progress Notes (Signed)
CBC and CMP WNL. Protein creatinine ratio WNL.  ESR and complements WNL.  dsDNA is negative. No change in therapy recommended at this time.  Labs are not consistent with a flare.

## 2021-07-21 ENCOUNTER — Encounter: Payer: Self-pay | Admitting: Internal Medicine

## 2021-07-21 NOTE — Assessment & Plan Note (Signed)
Asthmatic bronchitis mild persistent uncomplicated Plan- continue present meds

## 2021-07-21 NOTE — Assessment & Plan Note (Signed)
Reintroduction to cat (60 yrs old, not going anywhere) triggered nasal congestion. Plan- try flonase as discussed. Igf this and antihistamine are insufficient, can refer to allergist

## 2021-07-28 ENCOUNTER — Other Ambulatory Visit: Payer: Self-pay | Admitting: Internal Medicine

## 2021-08-02 ENCOUNTER — Other Ambulatory Visit: Payer: Self-pay | Admitting: Internal Medicine

## 2021-08-02 ENCOUNTER — Other Ambulatory Visit: Payer: Self-pay | Admitting: *Deleted

## 2021-08-02 DIAGNOSIS — G4719 Other hypersomnia: Secondary | ICD-10-CM

## 2021-08-02 NOTE — Telephone Encounter (Signed)
Pt last seen 02/25/21. Instructed to follow up in one year. Pending appt 02/28/22. Last modafinil filled for #45 on 06/29/21.

## 2021-08-03 MED ORDER — MODAFINIL 100 MG PO TABS: 150.0000 mg | ORAL_TABLET | Freq: Every day | ORAL | 5 refills | Status: DC

## 2021-08-04 ENCOUNTER — Other Ambulatory Visit: Payer: Self-pay | Admitting: Internal Medicine

## 2021-08-04 MED ORDER — PREDNISONE 10 MG PO TABS: ORAL_TABLET | ORAL | 0 refills | Status: DC

## 2021-08-04 NOTE — Telephone Encounter (Signed)
Dr. Annamaria Boots, I am up in [place] taking care of my mother in her last few weeks.  With people coming in to visit my mother, a mosquito or two made it's way to my bedroom.  I have had a bad reaction to the bites and have attached a couple of pictures.  I have bought everything from the local [facility] that they have for the bites and nothing is working - only slowing the itching down for about an hour.  My legs and back have really taken the brunt of most of the bites. The one on my lower back is larger than a nickel but smaller than a quarter.   Tried sprays and creams. Benedryl does nothing. I am taking Sudafed but only works for about an hour but I wait for 4 hrs before taking more. I thought about going to the local urgent care, but I am concerned about bringing something else home to my mother.   Help! What is your suggestion? Or do I tough it out longer?   My husband is coming up so if your suggestion is a prescription, you can use [facility] in [place].   Misty Curtis  Attachments  [id number]  [phone]

## 2021-08-04 NOTE — Telephone Encounter (Signed)
Suggest prednisone 10 mg, # 10 tabs   2 daily x 2 days then one daily

## 2021-08-05 ENCOUNTER — Ambulatory Visit: Payer: BC Managed Care – PPO | Admitting: Physician Assistant

## 2021-08-08 NOTE — Telephone Encounter (Signed)
FYI for CY:    Just an update ~ I have taken the 2 pills per day for the 2 days and now am taking the 1 per day.  Still have a 4 spots that are bothersome but most are much better but will itch when meds are wearing off - about 2 hrs before pill time.  There is one on the inside of my left upper arm that is still really bad. Hoping it gets better in the next day or so.   Thanks for the meds. No problems with meds either. Misty Curtis

## 2021-08-10 ENCOUNTER — Telehealth: Payer: Self-pay | Admitting: *Deleted

## 2021-08-10 NOTE — Telephone Encounter (Signed)
Received fax from pharmacy stating that the pts insurance will not cover the symbicort 160 but they will cover the following:   Symbicort 80 Advair disk Breo Wixela Fluti/salmearer   CY please advise. Thanks

## 2021-08-10 NOTE — Telephone Encounter (Signed)
Please change script to Symbicort 80, # 1, inhale 2 puffs then rinse mouth, twice daily

## 2021-08-11 MED ORDER — BUDESONIDE-FORMOTEROL FUMARATE 80-4.5 MCG/ACT IN AERO: 2.0000 | INHALATION_SPRAY | Freq: Two times a day (BID) | RESPIRATORY_TRACT | 12 refills | Status: DC

## 2021-08-11 NOTE — Telephone Encounter (Signed)
Symbicort 80 prescription sent to Patient pharmacy.  Symbicort 160 removed from Patient med list.  Nothing further at this time.

## 2021-09-01 ENCOUNTER — Other Ambulatory Visit: Payer: Self-pay | Admitting: Internal Medicine

## 2021-09-19 ENCOUNTER — Other Ambulatory Visit: Payer: Self-pay | Admitting: Rheumatology

## 2021-09-19 DIAGNOSIS — M359 Systemic involvement of connective tissue, unspecified: Secondary | ICD-10-CM

## 2021-09-20 MED ORDER — HYDROXYCHLOROQUINE SULFATE 200 MG PO TABS: ORAL_TABLET | ORAL | 0 refills | Status: DC

## 2021-09-20 NOTE — Telephone Encounter (Signed)
Next Visit: 12/08/2021  Last Visit: 07/14/2021  Labs: 07/14/2021 CBC and CMP WNL.  Eye exam: 04/14/2021 WNL    Current Dose per office note 07/14/2021: Plaquenil 200 mg 1 tablet in the morning half tablet in evening  PH:[id number] disease  Last Fill: 04/26/2021  Okay to refill Plaquenil?

## 2021-11-24 NOTE — Progress Notes (Signed)
Office Visit Note  Patient: Misty Curtis             Date of Birth: 08-16-1961           MRN: [id number]             PCP: Philmore Pali, NP Referring: Philmore Pali, NP Visit Date: 12/08/2021 Occupation: @GUAROCC @  Subjective:  Medication management.   History of Present Illness: Misty Curtis is a 60 y.o. female with a history of autoimmune disease and osteoarthritis.  She continues to have sicca symptoms and Raynaud's phenomenon.  She states her symptoms are manageable with over-the-counter products.  She keeps her hands warm and has not noticed any digital ulcers.  Her carpal tunnel syndrome symptoms improved after wearing the carpal tunnel braces.  She had good response to Achilles tendon surgery.  She recently has been experiencing some discomfort in her left first MTP joint.  She denies any redness or swelling in her joint.  Activities of Daily Living:  Patient reports morning stiffness for 30 minutes.   Patient Reports nocturnal pain.  Difficulty dressing/grooming: Denies Difficulty climbing stairs: Reports Difficulty getting out of chair: Denies Difficulty using hands for taps, buttons, cutlery, and/or writing: Reports  Review of Systems  Constitutional:  Positive for fatigue. Negative for night sweats, weight gain and weight loss.  HENT:  Positive for mouth dryness and nose dryness. Negative for mouth sores, trouble swallowing and trouble swallowing.   Eyes:  Positive for dryness. Negative for pain, redness, itching and visual disturbance.  Respiratory:  Negative for cough, shortness of breath and difficulty breathing.   Cardiovascular:  Negative for chest pain, palpitations, hypertension, irregular heartbeat and swelling in legs/feet.  Gastrointestinal:  Negative for blood in stool, constipation and diarrhea.  Endocrine: Negative for increased urination.  Genitourinary:  Negative for difficulty urinating and vaginal dryness.  Musculoskeletal:  Positive for joint pain, joint pain,  myalgias, morning stiffness, muscle tenderness and myalgias. Negative for joint swelling and muscle weakness.  Skin:  Positive for color change. Negative for rash, hair loss, redness, skin tightness, ulcers and sensitivity to sunlight.  Allergic/Immunologic: Positive for susceptible to infections.  Neurological:  Positive for dizziness and weakness. Negative for numbness, headaches, memory loss and night sweats.  Hematological:  Positive for bruising/bleeding tendency. Negative for swollen glands.  Psychiatric/Behavioral:  Negative for depressed mood, confusion and sleep disturbance. The patient is not nervous/anxious.    PMFS History:  Patient Active Problem List   Diagnosis Date Noted   Heel pain, chronic, left 09/30/2020   Achilles tendinitis, left leg 08/19/2020   Heel pain, chronic, right 05/02/2019   Thrush 06/11/2018   Acquired hypothyroidism 12/08/2016   Autoimmune disease ([place]) 12/06/2016   Other fatigue 12/06/2016   Raynaud's disease without gangrene 12/06/2016   High risk medication use 12/06/2016   Allergic asthma, mild intermittent, uncomplicated 20/94/7096   History of hypertension 12/06/2016   Excessive daytime sleepiness 07/26/2016   Snoring 07/26/2016   Laryngitis 11/10/2012   Postnasal drip 05/02/2012   Acute sinusitis, unspecified 10/24/2011   HYPERTENSION 12/14/2009   BRONCHITIS 12/14/2009   LUPUS 12/14/2009   History of melanoma 12/14/2009   Seasonal and perennial allergic rhinitis 12/14/2009    Past Medical History:  Diagnosis Date   Allergic rhinitis    skin test 01-18-10   Bronchitis    Lupus (systemic lupus erythematosus) (HCC)    joints and skin rash. Dr. Estanislado Pandy   Melanoma [facility])    Rhinosinusitis    Skin  cancer     Family History  Problem Relation Age of Onset   Stroke Mother    Allergies Father    Heart disease Father        bacterial endocarditis   Hypothyroidism Sister    Hypothyroidism Sister    Hashimoto's thyroiditis Sister     Cancer Maternal Grandmother    Hepatitis Son    Past Surgical History:  Procedure Laterality Date   ABDOMINAL HYSTERECTOMY     ACHILLES TENDON REPAIR Bilateral    Dr. Durward Fortes    BREAST LUMPECTOMY Left    08/21/2019, 09/20/2019   KNEE ARTHROPLASTY     skin cancer extraction     Social History   Social History Narrative   Not on file   Immunization History  Administered Date(s) Administered   Influenza Split 09/19/2011, 09/12/2012, 09/02/2013   Influenza-Unspecified 10/19/2014   Moderna Sars-Covid-2 Vaccination 02/26/2020, 03/25/2020, 12/09/2020   Pneumococcal Polysaccharide-23 08/19/2010   Pneumococcal-Unspecified 07/01/2020     Objective: Vital Signs: BP (!) 155/78 (BP Location: Left Arm, Patient Position: Sitting, Cuff Size: Normal)   Pulse 79   Ht 5\' 3"  (1.6 m)   Wt 195 lb 12.8 oz (88.8 kg)   BMI 34.68 kg/m    Physical Exam Vitals and nursing note reviewed.  Constitutional:      Appearance: She is well-developed.  HENT:     Head: Normocephalic and atraumatic.  Eyes:     Conjunctiva/sclera: Conjunctivae normal.  Cardiovascular:     Rate and Rhythm: Normal rate and regular rhythm.     Heart sounds: Normal heart sounds.  Pulmonary:     Effort: Pulmonary effort is normal.     Breath sounds: Normal breath sounds.  Abdominal:     General: Bowel sounds are normal.     Palpations: Abdomen is soft.  Musculoskeletal:     Cervical back: Normal range of motion.  Lymphadenopathy:     Cervical: No cervical adenopathy.  Skin:    General: Skin is warm and dry.     Capillary Refill: Capillary refill takes less than 2 seconds.  Neurological:     Mental Status: She is alert and oriented to person, place, and time.  Psychiatric:        Behavior: Behavior normal.     Musculoskeletal Exam: Spine was in good range of motion.  Shoulder joints, elbow joints, wrist joints, MCPs PIPs and DIPs with good range of motion.  She had bilateral DIP thickening with no synovitis.  Hip  joints and knee joints with good range of motion.  She had no redness or tenderness over her MCPs or PIPs.  Dorsal spurring was noted.  CDAI Exam: CDAI Score: -- Patient Global: --; Provider Global: -- Swollen: --; Tender: -- Joint Exam 12/08/2021   No joint exam has been documented for this visit   There is currently no information documented on the homunculus. Go to the Rheumatology activity and complete the homunculus joint exam.  Investigation: No additional findings.  Imaging: No results found.  Recent Labs: Lab Results  Component Value Date   WBC 7.7 12/03/2021   HGB 13.9 12/03/2021   PLT 293 12/03/2021   NA 141 12/03/2021   K 3.9 12/03/2021   CL 104 12/03/2021   CO2 30 12/03/2021   GLUCOSE 122 (H) 12/03/2021   BUN 19 12/03/2021   CREATININE 0.97 12/03/2021   BILITOT 0.3 12/03/2021   ALKPHOS 82 06/02/2017   AST 20 12/03/2021   ALT 9 12/03/2021   PROT  6.2 12/03/2021   ALBUMIN 4.1 06/02/2017   CALCIUM 9.0 12/03/2021   GFRAA 70 04/12/2021    Speciality Comments: PLQ eye exam: 04/14/2021 WNL at Dr Myna Hidalgo. Hulan Saas, OD Follow up in 1 year  Patient having PLQ eye exam 04/15/2021  Procedures:  No procedures performed Allergies: Latex, Cat hair extract, Lactose intolerance (gi), Milk-related compounds, Neomycin-bacitracin zn-polymyx, Onion, Strawberry extract, and Wixela inhub [fluticasone-salmeterol]   Assessment / Plan:     Visit Diagnoses: Autoimmune disease ([place]) - History of positive ANA, DS DNA, Raynauds, fatigue, oral ulcers, nasal ulcers, arthralgias, sicca: She continues to have sicca symptoms and raynaud's phenominon.  She denies any recent problems with oral ulcers or nasal process.  She has been tolerating medications well.  She denies any history of digital ulcers.  Over-the-counter products for sicca symptoms were discussed.  High risk medication use - Plaquenil 200 mg 1 tablet in the morning and 1/2 tablet in the evening, Methotrexate 5 tablets by mouth  once a week, and folic acid 1 mg daily.  Labs obtained on December 03, 2021 were reviewed.  CBC and CMP are within normal limits.  All autoimmune labs were negative.  She was advised to get labs in 3 months and then every 3 months to monitor for drug toxicity.  She is advised to hold methotrexate in case she develops an infection and resume after the infection resolves.  Information on immunization was also placed in the AVS.  Left foot pain-she describes discomfort over left first MTP joint.  There was no tenderness on palpation.  No warmth or swelling was noted.  She has osteoarthritic changes in her feet with bilateral PIP and DIP thickening and dorsal spurring.  Proper fitting shoes with arch support were discussed.  I advised her to contact me in case she develops worsening of symptoms.  Raynaud's disease without gangrene-keeping core temperature warm and warm clothing was discussed.  Right carpal tunnel syndrome-improved after using carpal tunnel brace.  Achilles tendinitis of right lower extremity - Resolved.   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  Her symptoms have improved.  History of melanoma  History of fatigue  History of hypertension-blood pressure was elevated.  She was advised to monitor blood pressure closely and follow-up with the PCP.  History of hypothyroidism  History of asthma  History of sleep apnea  Pitting edema  Osteoporosis screening - DEXA ordered by PCP-normal according to the patient.   Orders: No orders of the defined types were placed in this encounter.  No orders of the defined types were placed in this encounter.    Follow-Up Instructions: Return in about 5 months (around 05/08/2022) for Autoimmune disease.   Bo Merino, MD  Note - This record has been created using Editor, commissioning.   Chart creation errors have been sought, but may not always  have been located. Such creation errors do not reflect on  the standard of medical care.

## 2021-12-02 ENCOUNTER — Other Ambulatory Visit: Payer: Self-pay | Admitting: Physician Assistant

## 2021-12-02 DIAGNOSIS — M359 Systemic involvement of connective tissue, unspecified: Secondary | ICD-10-CM

## 2021-12-03 ENCOUNTER — Other Ambulatory Visit: Payer: Self-pay | Admitting: *Deleted

## 2021-12-03 DIAGNOSIS — I7300 Raynaud's syndrome without gangrene: Secondary | ICD-10-CM

## 2021-12-03 DIAGNOSIS — M359 Systemic involvement of connective tissue, unspecified: Secondary | ICD-10-CM

## 2021-12-03 DIAGNOSIS — Z79899 Other long term (current) drug therapy: Secondary | ICD-10-CM

## 2021-12-03 NOTE — Telephone Encounter (Signed)
Next Visit: 12/08/2021  Last Visit: 07/14/2021  Labs: 07/14/2021 CBC and CMP WNL.   Eye exam: 04/14/2021 WNL   Current Dose per office note 07/14/2021: Plaquenil 200 mg 1 tablet in the morning and 1/2 tablet in the evening  DX: Autoimmune disease   Last Fill: 09/20/2021  Okay to refill Plaquenil?

## 2021-12-06 ENCOUNTER — Telehealth: Payer: Self-pay

## 2021-12-06 LAB — URINALYSIS, ROUTINE W REFLEX MICROSCOPIC
Bilirubin Urine: NEGATIVE
Glucose, UA: NEGATIVE
Hgb urine dipstick: NEGATIVE
Ketones, ur: NEGATIVE
Leukocytes,Ua: NEGATIVE
Nitrite: NEGATIVE
Protein, ur: NEGATIVE
Specific Gravity, Urine: 1.018 (ref 1.001–1.035)
pH: 6.5 (ref 5.0–8.0)

## 2021-12-06 LAB — CBC WITH DIFFERENTIAL/PLATELET
Absolute Monocytes: 585 cells/uL (ref 200–950)
Basophils Absolute: 39 cells/uL (ref 0–200)
Basophils Relative: 0.5 %
Eosinophils Absolute: 177 cells/uL (ref 15–500)
Eosinophils Relative: 2.3 %
HCT: 42.2 % (ref 35.0–45.0)
Hemoglobin: 13.9 g/dL (ref 11.7–15.5)
Lymphs Abs: 2341 cells/uL (ref 850–3900)
MCH: 30 pg (ref 27.0–33.0)
MCHC: 32.9 g/dL (ref 32.0–36.0)
MCV: 90.9 fL (ref 80.0–100.0)
MPV: 9.9 fL (ref 7.5–12.5)
Monocytes Relative: 7.6 %
Neutro Abs: 4558 cells/uL (ref 1500–7800)
Neutrophils Relative %: 59.2 %
Platelets: 293 10*3/uL (ref 140–400)
RBC: 4.64 10*6/uL (ref 3.80–5.10)
RDW: 12.6 % (ref 11.0–15.0)
Total Lymphocyte: 30.4 %
WBC: 7.7 10*3/uL (ref 3.8–10.8)

## 2021-12-06 LAB — COMPLETE METABOLIC PANEL WITH GFR
AG Ratio: 1.6 (calc) (ref 1.0–2.5)
ALT: 9 U/L (ref 6–29)
AST: 20 U/L (ref 10–35)
Albumin: 3.8 g/dL (ref 3.6–5.1)
Alkaline phosphatase (APISO): 83 U/L (ref 37–153)
BUN: 19 mg/dL (ref 7–25)
CO2: 30 mmol/L (ref 20–32)
Calcium: 9 mg/dL (ref 8.6–10.4)
Chloride: 104 mmol/L (ref 98–110)
Creat: 0.97 mg/dL (ref 0.50–1.05)
Globulin: 2.4 g/dL (calc) (ref 1.9–3.7)
Glucose, Bld: 122 mg/dL — ABNORMAL HIGH (ref 65–99)
Potassium: 3.9 mmol/L (ref 3.5–5.3)
Sodium: 141 mmol/L (ref 135–146)
Total Bilirubin: 0.3 mg/dL (ref 0.2–1.2)
Total Protein: 6.2 g/dL (ref 6.1–8.1)
eGFR: 66 mL/min/{1.73_m2} (ref >=60)

## 2021-12-06 LAB — ANTI-DNA ANTIBODY, DOUBLE-STRANDED: ds DNA Ab: 1 IU/mL

## 2021-12-06 LAB — ANA: Anti Nuclear Antibody (ANA): POSITIVE — AB

## 2021-12-06 LAB — C3 AND C4
C3 Complement: 151 mg/dL (ref 83–193)
C4 Complement: 28 mg/dL (ref 15–57)

## 2021-12-06 LAB — SEDIMENTATION RATE: Sed Rate: 9 mm/h (ref 0–30)

## 2021-12-06 LAB — PROTEIN / CREATININE RATIO, URINE
Creatinine, Urine: 109 mg/dL (ref 20–275)
Protein/Creat Ratio: 55 mg/g creat (ref 24–184)
Protein/Creatinine Ratio: 0.055 mg/mg creat (ref 0.024–0.184)
Total Protein, Urine: 6 mg/dL (ref 5–24)

## 2021-12-06 LAB — ANTI-NUCLEAR AB-TITER (ANA TITER): ANA Titer 1: 1:40 {titer} — ABNORMAL HIGH

## 2021-12-06 NOTE — Progress Notes (Signed)
UA is negative, ANA low titer positive, glucose is mildly elevated, probably not a fasting sample.  CBC is normal, complements are normal, sed rate is normal, urine protein creatinine ratio is normal.  The labs do not indicate active autoimmune disease.

## 2021-12-06 NOTE — Telephone Encounter (Signed)
Modafinil 100MG  tablets Approved From: 12/06/2021 - 12/06/2022

## 2021-12-06 NOTE — Telephone Encounter (Signed)
PA for Modafinil 100 MG Tablets started. Key: [id number] - PA Case ID: [id number] - Rx #: [id number]

## 2021-12-08 ENCOUNTER — Ambulatory Visit: Payer: BC Managed Care – PPO | Admitting: Rheumatology

## 2021-12-08 ENCOUNTER — Encounter: Payer: Self-pay | Admitting: Rheumatology

## 2021-12-08 ENCOUNTER — Other Ambulatory Visit: Payer: Self-pay

## 2021-12-08 VITALS — BP 155/78 | HR 79 | Ht 63.0 in | Wt 195.8 lb

## 2021-12-08 DIAGNOSIS — I7300 Raynaud's syndrome without gangrene: Secondary | ICD-10-CM | POA: Diagnosis not present

## 2021-12-08 DIAGNOSIS — Z13820 Encounter for screening for osteoporosis: Secondary | ICD-10-CM

## 2021-12-08 DIAGNOSIS — Z8669 Personal history of other diseases of the nervous system and sense organs: Secondary | ICD-10-CM

## 2021-12-08 DIAGNOSIS — Z85820 Personal history of malignant melanoma of skin: Secondary | ICD-10-CM

## 2021-12-08 DIAGNOSIS — M7661 Achilles tendinitis, right leg: Secondary | ICD-10-CM

## 2021-12-08 DIAGNOSIS — R609 Edema, unspecified: Secondary | ICD-10-CM

## 2021-12-08 DIAGNOSIS — Z8639 Personal history of other endocrine, nutritional and metabolic disease: Secondary | ICD-10-CM

## 2021-12-08 DIAGNOSIS — Z8709 Personal history of other diseases of the respiratory system: Secondary | ICD-10-CM

## 2021-12-08 DIAGNOSIS — Z8679 Personal history of other diseases of the circulatory system: Secondary | ICD-10-CM

## 2021-12-08 DIAGNOSIS — G5601 Carpal tunnel syndrome, right upper limb: Secondary | ICD-10-CM

## 2021-12-08 DIAGNOSIS — M6788 Other specified disorders of synovium and tendon, other site: Secondary | ICD-10-CM

## 2021-12-08 DIAGNOSIS — M79672 Pain in left foot: Secondary | ICD-10-CM | POA: Diagnosis not present

## 2021-12-08 DIAGNOSIS — Z79899 Other long term (current) drug therapy: Secondary | ICD-10-CM | POA: Diagnosis not present

## 2021-12-08 DIAGNOSIS — M359 Systemic involvement of connective tissue, unspecified: Secondary | ICD-10-CM | POA: Diagnosis not present

## 2021-12-08 DIAGNOSIS — Z87898 Personal history of other specified conditions: Secondary | ICD-10-CM

## 2021-12-08 NOTE — Patient Instructions (Signed)
Standing Labs We placed an order today for your standing lab work.   Please have your standing labs drawn in March and every 3 months  If possible, please have your labs drawn 2 weeks prior to your appointment so that the provider can discuss your results at your appointment.  Please note that you may see your imaging and lab results in [place] before we have reviewed them. We may be awaiting multiple results to interpret others before contacting you. Please allow our office up to 72 hours to thoroughly review all of the results before contacting the office for clarification of your results.  We have open lab daily: Monday through Thursday from 1:30-4:30 PM and Friday from 1:30-4:00 PM at the office of Dr. Bo Merino, [facility].   Please be advised, all patients with office appointments requiring lab work will take precedent over walk-in lab work.  If possible, please come for your lab work on Monday and Friday afternoons, as you may experience shorter wait times. The office is located at [address], [place], [place], [place] [address] No appointment is necessary.   Labs are drawn by [facility]. Please bring your co-pay at the time of your lab draw.  You may receive a bill from [facility] for your lab work.  If you wish to have your labs drawn at another location, please call the office 24 hours in advance to send orders.  If you have any questions regarding directions or hours of operation,  please call [phone].   As a reminder, please drink plenty of water prior to coming for your lab work. Thanks!   Vaccines You are taking a medication(s) that can suppress your immune system.  The following immunizations are recommended: Flu annually Covid-19  Td/Tdap (tetanus, diphtheria, pertussis) every 10 years Pneumonia (Prevnar 15 then Pneumovax 23 at least 1 year apart.  Alternatively, can take Prevnar 20 without needing additional dose) Shingrix: 2 doses from 4 weeks  to 6 months apart  Please check with your PCP to make sure you are up to date.   If you have signs or symptoms of an infection or start antibiotics: First, call your PCP for workup of your infection. Hold your medication through the infection, until you complete your antibiotics, and until symptoms resolve if you take the following: Injectable medication (Actemra, Benlysta, Cimzia, Cosentyx, Enbrel, Humira, Kevzara, Orencia, Remicade, Simponi, Stelara, Taltz, Tremfya) Methotrexate Leflunomide (Arava) Mycophenolate (Cellcept) Morrie Sheldon, Olumiant, or Rinvoq

## 2021-12-19 HISTORY — PX: OTHER SURGICAL HISTORY: SHX169

## 2022-01-02 ENCOUNTER — Other Ambulatory Visit: Payer: Self-pay | Admitting: Internal Medicine

## 2022-01-28 ENCOUNTER — Other Ambulatory Visit: Payer: Self-pay | Admitting: Rheumatology

## 2022-01-28 NOTE — Telephone Encounter (Signed)
Next Visit: 05/09/2022  Last Visit: 12/08/2021  Last Fill: 06/14/2021  Dx:  Autoimmune disease   Current Dose per office note on 12/08/2021: not discussed  Okay to refill Zofran?

## 2022-02-11 ENCOUNTER — Other Ambulatory Visit: Payer: Self-pay | Admitting: Neurology

## 2022-02-11 DIAGNOSIS — G4719 Other hypersomnia: Secondary | ICD-10-CM

## 2022-02-14 NOTE — Telephone Encounter (Signed)
Received refill request for modafinil.  Last OV was on 02/25/21.  Next OV is scheduled for 03/22/22 .  Last RX was written on 1/26/2 for 45 tabs.   [place] [facility] Database has been reviewed.

## 2022-02-28 ENCOUNTER — Ambulatory Visit: Payer: BC Managed Care – PPO | Admitting: Neurology

## 2022-03-10 ENCOUNTER — Other Ambulatory Visit: Payer: Self-pay | Admitting: Physician Assistant

## 2022-03-10 DIAGNOSIS — M359 Systemic involvement of connective tissue, unspecified: Secondary | ICD-10-CM

## 2022-03-10 NOTE — Telephone Encounter (Signed)
Next Visit: 05/09/2022 ?  ?Last Visit: 12/08/2021 ? ?Labs: 12/03/2021 glucose is mildly elevated, probably not a fasting sample.  CBC is normal, ? ?PLQ eye exam: 04/14/2021 WNL  ?  ?Last Fill: 12/03/2021 (PLQ), 9/0/3009 (Folic Acid) ? ?Dx:  Autoimmune disease  ?  ?Current Dose per office note on 12/08/2021: Plaquenil 200 mg 1 tablet in the morning and 1/2 tablet in the evening. folic acid 1 mg daily ? ?Okay to refill PLQ and Folic Acid?  ?

## 2022-03-14 ENCOUNTER — Other Ambulatory Visit: Payer: Self-pay | Admitting: Internal Medicine

## 2022-03-16 ENCOUNTER — Other Ambulatory Visit: Payer: Self-pay | Admitting: *Deleted

## 2022-03-16 DIAGNOSIS — Z79899 Other long term (current) drug therapy: Secondary | ICD-10-CM

## 2022-03-17 LAB — COMPLETE METABOLIC PANEL WITH GFR
AG Ratio: 1.8 (calc) (ref 1.0–2.5)
ALT: 14 U/L (ref 6–29)
AST: 26 U/L (ref 10–35)
Albumin: 4.1 g/dL (ref 3.6–5.1)
Alkaline phosphatase (APISO): 87 U/L (ref 37–153)
BUN: 15 mg/dL (ref 7–25)
CO2: 28 mmol/L (ref 20–32)
Calcium: 9.2 mg/dL (ref 8.6–10.4)
Chloride: 104 mmol/L (ref 98–110)
Creat: 0.85 mg/dL (ref 0.50–1.05)
Globulin: 2.3 g/dL (calc) (ref 1.9–3.7)
Glucose, Bld: 127 mg/dL — ABNORMAL HIGH (ref 65–99)
Potassium: 3.8 mmol/L (ref 3.5–5.3)
Sodium: 142 mmol/L (ref 135–146)
Total Bilirubin: 0.4 mg/dL (ref 0.2–1.2)
Total Protein: 6.4 g/dL (ref 6.1–8.1)
eGFR: 77 mL/min/{1.73_m2} (ref >=60)

## 2022-03-17 LAB — CBC WITH DIFFERENTIAL/PLATELET
Absolute Monocytes: 479 cells/uL (ref 200–950)
Basophils Absolute: 38 cells/uL (ref 0–200)
Basophils Relative: 0.4 %
Eosinophils Absolute: 235 cells/uL (ref 15–500)
Eosinophils Relative: 2.5 %
HCT: 42.9 % (ref 35.0–45.0)
Hemoglobin: 14.6 g/dL (ref 11.7–15.5)
Lymphs Abs: 2989 cells/uL (ref 850–3900)
MCH: 30.3 pg (ref 27.0–33.0)
MCHC: 34 g/dL (ref 32.0–36.0)
MCV: 89 fL (ref 80.0–100.0)
MPV: 9.4 fL (ref 7.5–12.5)
Monocytes Relative: 5.1 %
Neutro Abs: 5659 cells/uL (ref 1500–7800)
Neutrophils Relative %: 60.2 %
Platelets: 322 10*3/uL (ref 140–400)
RBC: 4.82 10*6/uL (ref 3.80–5.10)
RDW: 13.3 % (ref 11.0–15.0)
Total Lymphocyte: 31.8 %
WBC: 9.4 10*3/uL (ref 3.8–10.8)

## 2022-03-17 NOTE — Progress Notes (Signed)
CBC and CMP normal.  Glucose is mildly elevated, probably not a fasting sample.

## 2022-03-21 NOTE — Progress Notes (Signed)
? ? ?PATIENT: Misty Curtis ?DOB: August 19, 1961 ? ?REASON FOR VISIT: follow up for hypersomnia  ?HISTORY FROM: patient ?PRIMARY NEUROLOGIST: Dr. Brett Fairy  ? ?HISTORY OF PRESENT ILLNESS: ?Today 03/22/22  ?Misty Curtis here today for follow-up. Taking Provigil 150 mg in AM at 8 AM. Keeps her awake and active, and she still can sleep. Is retired fully now. Substitutes when she wants, keeps her grandchildren. Had lupus flare back in January, still on Methotrexate and Plaquenil. Mother passed away in 08-26-23. She is assisting her elderly uncle in his affairs, goes to [place] once a month. BP up today, took her medication, keeps check on it at home, runs in 120's. ? ?Update 02/19/21 SS: Misty Curtis is a 61 year old female with history of idiopathic hypersomnia.  Remains on Provigil 150 mg daily.  200 mg was too much, 100 mg was not enough.  The 150 mg dosage seems to work well.  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  Had a rough couple months with her lupus.  BP elevated today, checks at home, usually 140/80.  Recently had left Achilles reconstructive surgery.  Here today for evaluation unaccompanied. FSS was 32.  ? ?Update February 26, 2020 SS: Misty Curtis is a 62 year old female with history of idiopathic hypersomnia.  She remains on Provigil 200 mg daily, however she is only taking half tablet.  When she takes the 200 mg, she says she will stay up till midnight.  When taking 100 mg, around 630, she will lay on the couch, fall asleep. She seems to do well during the day, up until 6:30 pm. 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  In the last several months, she has had Achilles tendon reconstruction, and 2 breast lumpectomies.  In the last year, she has gained about 10 pounds.  She is not sure if her increased drowsiness, is related to deconditioning or weight gain?  She would like to try dose modification.  She presents today for  evaluation unaccompanied. ESS was 12. ? ?HISTORY ?02/20/2019 SS: Misty Curtis is a 61 year old female with a history of idiopathic hypersomnia.  She is currently taking Provigil 200 mg daily however she is only taking half tablet.  She reports she is tolerating the medication well.  She will take it around 6:30 AM and she reports benefit until about 9:30 PM whenever she goes to bed.  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  She reports she is able to do her job well without significant fatigue.  She returns today requesting a refill on her Provigil.  She presents today for evaluation unaccompanied.  She denies any new problems or concerns.  She is wearing a boot to her right foot that is healing from a prior injury. ? ?REVIEW OF SYSTEMS: Out of a complete 14 system review of symptoms, the patient complains only of the following symptoms, and all other reviewed systems are negative. ? ?See HPI ? ?ALLERGIES: ?Allergies  ?Allergen Reactions  ? Latex Other (See Comments)  ? Cat Hair Extract   ? Lactose Intolerance (Gi) Diarrhea  ? Milk-Related Compounds Diarrhea  ? Neomycin-Bacitracin Zn-Polymyx   ?  REACTION: rash  ? Onion   ?  Also allergic to peppers and hot peppers and dairy   ? Strawberry Extract   ? Wixela Inhub [Fluticasone-Salmeterol]   ?  Per patient - caused throat to close and  laryngitis  ? ? ?HOME MEDICATIONS: ?Outpatient Medications Prior to Visit  ?Medication Sig Dispense Refill  ? albuterol (PROVENTIL HFA;VENTOLIN HFA) 108 (90 Base) MCG/ACT inhaler Inhale 2 puffs into the lungs every 6 (six) hours as needed for wheezing or shortness of breath. 1 Inhaler 12  ? Ascorbic Acid (VITAMIN C) 500 MG tablet Take 500 mg by mouth daily.    ? B Complex Vitamins (VITAMIN B COMPLEX PO) Take by mouth.    ? BIOTENE DRY MOUTH (BIOTENE) LIQD Place 1 application onto teeth as needed.    ? Cholecalciferol (VITAMIN D) 2000 units CAPS Take by mouth.    ? clotrimazole-betamethasone (LOTRISONE) cream APPLY TOPICALLY TO THE AFFECTED  AREA TWICE DAILY 30 g prn  ? diclofenac sodium (VOLTAREN) 1 % GEL 3 grams to 3 large joints up to 3 times daily 3 Tube 3  ? Digestive Enzymes (DIGESTIVE ENZYME PO) Take by mouth.    ? fluticasone (FLONASE) 50 MCG/ACT nasal spray SHAKE LIQUID AND USE 2 SPRAYS IN EACH NOSTRIL DAILY 16 g 3  ? folic acid (FOLVITE) 1 MG tablet TAKE 1 TABLET(1 MG) BY MOUTH DAILY 90 tablet 3  ? hydroxychloroquine (PLAQUENIL) 200 MG tablet TAKE 1 TABLET BY MOUTH EVERY MORNING, AND 1/2 TABLET EACH EVENING 135 tablet 0  ? lactase (LACTAID) 3000 UNITS tablet Take by mouth as needed.     ? lisinopril (ZESTRIL) 20 MG tablet Take 10 mg by mouth every morning.    ? loratadine (CLARITIN) 10 MG tablet Take 10 mg by mouth daily.    ? methotrexate (RHEUMATREX) 2.5 MG tablet Take 5 tablets by mouth once weekly. Caution:Chemotherapy. Protect from light. 60 tablet 0  ? modafinil (PROVIGIL) 100 MG tablet TAKE 1 AND 1/2 TABLETS(150 MG) BY MOUTH DAILY 45 tablet 2  ? montelukast (SINGULAIR) 10 MG tablet TAKE 1 TABLET BY MOUTH EVERY DAY 30 tablet 6  ? mupirocin ointment (BACTROBAN) 2 % APPLY EXTERNALLY TO THE AFFECTED AREA TWICE DAILY AS DIRECTED 22 g 12  ? NON FORMULARY Doterra Essential Oils:  Eucalyptus, Wintergreen, Lemongrass, Frankincense, [place], On Guard    ? ondansetron (ZOFRAN) 4 MG tablet TAKE 1 TABLET(4 MG) BY MOUTH EVERY 6 HOURS AS NEEDED FOR NAUSEA OR VOMITING 30 tablet 0  ? phenylephrine (SUDAFED PE) 10 MG TABS tablet Take 10 mg by mouth every 4 (four) hours as needed.    ? Specialty Vitamins Products (CENTRUM PERFORMANCE) TABS Take 1 tablet by mouth daily.    ? SYNTHROID 75 MCG tablet Take 75 mcg by mouth every morning.    ? budesonide-formoterol (SYMBICORT) 160-4.5 MCG/ACT inhaler INHALE 2 PUFFS INTO THE LUNGS TWICE DAILY. RINSE MOUTH AFTER USE (Patient not taking: Reported on 12/08/2021) 10.2 g 4  ? budesonide-formoterol (SYMBICORT) 80-4.5 MCG/ACT inhaler Inhale 2 puffs into the lungs in the morning and at bedtime. 1 each 12  ? predniSONE  (DELTASONE) 10 MG tablet Take 2 tabs for 2 days then one daily until finished (Patient not taking: Reported on 12/08/2021) 10 tablet 0  ? ?No facility-administered medications prior to visit.  ? ? ?PAST MEDICAL HISTORY: ?Past Medical History:  ?Diagnosis Date  ? Allergic rhinitis   ? skin test 01-18-10  ? Bronchitis   ? Lupus (systemic lupus erythematosus) ([place])   ? joints and skin rash. Dr. Estanislado Pandy  ? Melanoma ([place])   ? Rhinosinusitis   ? Skin cancer   ? ? ?PAST SURGICAL HISTORY: ?Past Surgical History:  ?Procedure Laterality Date  ? ABDOMINAL HYSTERECTOMY    ?  ACHILLES TENDON REPAIR Bilateral   ? Dr. Durward Fortes   ? BREAST LUMPECTOMY Left   ? 08/21/2019, 09/20/2019  ? KNEE ARTHROPLASTY    ? skin cancer extraction    ? ? ?FAMILY HISTORY: ?Family History  ?Problem Relation Age of Onset  ? Stroke Mother   ? Allergies Father   ? Heart disease Father   ?     bacterial endocarditis  ? Hypothyroidism Sister   ? Hypothyroidism Sister   ? Hashimoto's thyroiditis Sister   ? Cancer Maternal Grandmother   ? Hepatitis Son   ? ? ?SOCIAL HISTORY: ?Social History  ? ?Socioeconomic History  ? Marital status: Married  ?  Spouse name: Not on file  ? Number of children: 3  ? Years of education: Not on file  ? Highest education level: Not on file  ?Occupational History  ? 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 Smoking status: Never  ? Smokeless tobacco: Never  ?Vaping Use  ? Vaping Use: Never used  ?Substance and Sexual Activity  ? Alcohol use: Yes  ?  Comment: 1 MONTHLY  ? Drug use: No  ? Sexual activity: Not on file  ?Other Topics Concern  ? Not on file  ?Social History Narrative  ? Not on file  ? ?Social Determinants of Health  ? ?Financial Resource Strain: Not on file  ?Food Insecurity: Not on file  ?Transportation Needs: Not on file  ?Physical Activity: Not on file  ?Stress: Not on file  ?Social Connections: Not on file  ?Intimate Partner Violence: Not on file  ? ?PHYSICAL EXAM ? ?Vitals:  ? 03/22/22 1033   ?BP: (!) 171/80  ?Pulse: 78  ?Weight: 190 lb (86.2 kg)  ?Height: '5\' 3"'$  (1.6 m)  ? ? ?Body mass index is 33.66 kg/m?. ? ?Generalized: Well developed, in no acute distress ? ?Neurological examination  ?M

## 2022-03-22 ENCOUNTER — Ambulatory Visit: Payer: BC Managed Care – PPO | Admitting: Neurology

## 2022-03-22 ENCOUNTER — Encounter: Payer: Self-pay | Admitting: Neurology

## 2022-03-22 VITALS — BP 171/80 | HR 78 | Ht 63.0 in | Wt 190.0 lb

## 2022-03-22 DIAGNOSIS — G4710 Hypersomnia, unspecified: Secondary | ICD-10-CM

## 2022-03-22 DIAGNOSIS — G4730 Sleep apnea, unspecified: Secondary | ICD-10-CM | POA: Diagnosis not present

## 2022-03-22 NOTE — Patient Instructions (Signed)
Please check your BP at home, today is elevated  ?Continue current medications ?Return back in 1 year  ?

## 2022-04-25 NOTE — Progress Notes (Signed)
Office Visit Note  Patient: Misty Curtis             Date of Birth: [birth year]           MRN: [id number]             PCP: Philmore Pali, NP Referring: Philmore Pali, NP Visit Date: 05/09/2022 Occupation: '@GUAROCC' @  Subjective:  Left great toe pain   History of Present Illness: Misty Curtis is a 61 y.o. female with history of autoimmune disease.  She is taking plaquenil 200 mg 1 tablet in the morning and 1/2 tablet daily and methotrexate 5 tablets by mouth once weekly.  She is tolerating these medications without any side effects and has not missed any doses recently.  She states that in April she had a flare at which time she experienced a Malar rash along with body aches and fatigue.  She attributes this flare to being under increased stress.  She has not had any other rashes recently but continues to have photosensitivity and occasional hair loss.  She has symptoms of Raynaud's in her toes but not in her fingertips.  She has not had any oral or nasal ulcerations but continues to have chronic sicca symptoms.  She states for the past 2 weeks she has been experiencing intermittent sharp pain in her left great toe. She states about 2 weeks ago she hit her toe on a stool and is unsure if it was injured during that time.  Her symptoms have been fleeting and sporadic.  She denies any redness or swelling in the left great toe.  She denies any other increased joint pain or joint swelling recently.     Activities of Daily Living:  Patient reports morning stiffness for 30 minutes.   Patient Reports nocturnal pain.  Difficulty dressing/grooming: Denies Difficulty climbing stairs: Reports Difficulty getting out of chair: Denies Difficulty using hands for taps, buttons, cutlery, and/or writing: Reports  Review of Systems  Constitutional:  Positive for fatigue.  HENT:  Positive for mouth dryness and nose dryness. Negative for mouth sores.   Eyes:  Positive for dryness. Negative for pain and itching.   Respiratory:  Negative for shortness of breath and difficulty breathing.   Cardiovascular:  Negative for chest pain and palpitations.  Gastrointestinal:  Positive for diarrhea. Negative for blood in stool and constipation.  Endocrine: Negative for increased urination.  Genitourinary:  Negative for difficulty urinating.  Musculoskeletal:  Positive for joint pain, joint pain, joint swelling, myalgias, morning stiffness and myalgias. Negative for muscle tenderness.  Skin:  Positive for color change. Negative for rash and redness.  Allergic/Immunologic: Positive for susceptible to infections.  Neurological:  Negative for dizziness, numbness, headaches, memory loss and weakness.  Hematological:  Positive for bruising/bleeding tendency.  Psychiatric/Behavioral:  Negative for confusion.    PMFS History:  Patient Active Problem List   Diagnosis Date Noted   Heel pain, chronic, left 09/30/2020   Achilles tendinitis, left leg 08/19/2020   Heel pain, chronic, right 05/02/2019   Thrush 06/11/2018   Acquired hypothyroidism 12/08/2016   Autoimmune disease ([place]) 12/06/2016   Other fatigue 12/06/2016   Raynaud's disease without gangrene 12/06/2016   High risk medication use 12/06/2016   Allergic asthma, mild intermittent, uncomplicated 50/02/7047   History of hypertension 12/06/2016   Excessive daytime sleepiness 07/26/2016   Snoring 07/26/2016   Laryngitis 11/10/2012   Postnasal drip 05/02/2012   Acute sinusitis, unspecified 10/24/2011   HYPERTENSION 12/14/2009   BRONCHITIS 12/14/2009  LUPUS 12/14/2009   History of melanoma 12/14/2009   Seasonal and perennial allergic rhinitis 12/14/2009    Past Medical History:  Diagnosis Date   Allergic rhinitis    skin test 01-18-10   Bronchitis    Lupus (systemic lupus erythematosus) (HCC)    joints and skin rash. Dr. Estanislado Pandy   Melanoma [facility])    Rhinosinusitis    Skin cancer     Family History  Problem Relation Age of Onset   Stroke Mother     Allergies Father    Heart disease Father        bacterial endocarditis   Hypothyroidism Sister    Hypothyroidism Sister    Hashimoto's thyroiditis Sister    Cancer Maternal Grandmother    Hepatitis Son    Past Surgical History:  Procedure Laterality Date   ABDOMINAL HYSTERECTOMY     ACHILLES TENDON REPAIR Bilateral    Dr. Durward Fortes    BREAST LUMPECTOMY Left    08/21/2019, 09/20/2019   KNEE ARTHROPLASTY     skin cancer extraction     Social History   Social History Narrative   Not on file   Immunization History  Administered Date(s) Administered   Influenza Split 09/19/2011, 09/12/2012, 09/02/2013   Influenza-Unspecified 10/19/2014   Moderna Sars-Covid-2 Vaccination 02/26/2020, 03/25/2020, 12/09/2020   Pneumococcal Polysaccharide-23 08/19/2010   Pneumococcal-Unspecified 07/01/2020     Objective: Vital Signs: BP (!) 146/84 (BP Location: Left Arm, Patient Position: Sitting, Cuff Size: Normal)   Pulse 82   Ht '5\' 3"'  (1.6 m)   Wt 198 lb 3.2 oz (89.9 kg)   BMI 35.11 kg/m    Physical Exam Vitals and nursing note reviewed.  Constitutional:      Appearance: She is well-developed.  HENT:     Head: Normocephalic and atraumatic.  Eyes:     Conjunctiva/sclera: Conjunctivae normal.  Cardiovascular:     Rate and Rhythm: Normal rate and regular rhythm.     Heart sounds: Normal heart sounds.  Pulmonary:     Effort: Pulmonary effort is normal.     Breath sounds: Normal breath sounds.  Abdominal:     General: Bowel sounds are normal.     Palpations: Abdomen is soft.  Musculoskeletal:     Cervical back: Normal range of motion.  Skin:    General: Skin is warm and dry.     Capillary Refill: Capillary refill takes less than 2 seconds.  Neurological:     Mental Status: She is alert and oriented to person, place, and time.  Psychiatric:        Behavior: Behavior normal.     Musculoskeletal Exam: C-spine has good range of motion.  Mild postural thoracic kyphosis noted.  No  midline spinal tenderness or SI joint tenderness at this time.  Shoulder joints, elbow joints, wrist joints, MCPs, PIPs, DIPs have good range of motion with no synovitis.  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  Complete fist formation bilaterally.  Hip joints and knee joints have good range of motion with no discomfort.  No warmth or effusion of knee joints noted.  Ankle joints have good range of motion with no joint tenderness or synovitis.  Tenderness over the left first MTP joint noted.  Dorsal spurring noted bilaterally.  CDAI Exam: CDAI Score: -- Patient Global: --; Provider Global: -- Swollen: --; Tender: -- Joint Exam 05/09/2022   No joint exam has been documented for this visit   There is currently no information documented on the  homunculus. Go to the Rheumatology activity and complete the homunculus joint exam.  Investigation: No additional findings.  Imaging: No results found.  Recent Labs: Lab Results  Component Value Date   WBC 9.4 03/16/2022   HGB 14.6 03/16/2022   PLT 322 03/16/2022   NA 142 03/16/2022   K 3.8 03/16/2022   CL 104 03/16/2022   CO2 28 03/16/2022   GLUCOSE 127 (H) 03/16/2022   BUN 15 03/16/2022   CREATININE 0.85 03/16/2022   BILITOT 0.4 03/16/2022   ALKPHOS 82 06/02/2017   AST 26 03/16/2022   ALT 14 03/16/2022   PROT 6.4 03/16/2022   ALBUMIN 4.1 06/02/2017   CALCIUM 9.2 03/16/2022   GFRAA 70 04/12/2021    Speciality Comments: PLQ eye exam: 04/14/2021 WNL at Dr Myna Hidalgo. Hulan Saas, OD Follow up in 1 year  Patient having PLQ eye exam 04/15/2021  Procedures:  No procedures performed Allergies: Latex, Cat hair extract, Lactose intolerance (gi), Milk-related compounds, Neomycin-bacitracin zn-polymyx, Onion, Strawberry extract, and Wixela inhub [fluticasone-salmeterol]      Assessment / Plan:     Visit Diagnoses: Autoimmune disease ([place]) - History of positive ANA, DS DNA, Raynauds, fatigue, oral ulcers, nasal ulcers,  arthralgias, sicca: Patient remains on Plaquenil 200 mg 1 tablet in the morning and half tablet in the evening along with methotrexate 5 tablets weekly.  In April she had an episode of a malar rash, increased fatigue, and body aches while being under increased stress.  She has not had any recurrence of the symptoms.  On examination today but no Maller rash was noted.  She continues to have ongoing photosensitivity and was advised to wear sunscreen SPF 50 on a daily basis and to avoid direct sun exposure.  She was encouraged to wear a widebrimmed hat and sun protective clothing if she plans on being outdoors.  She continues to experience intermittent symptoms of Raynaud's in her feet but has not had any digital ulcerations or signs of gangrene.  She has not had any oral or nasal ulcerations but has chronic sicca symptoms.  On examination no synovitis was noted. Lab work from 12/03/21 was reviewed today in the office: ANA 1:40NH, dsDNA negative, complements WNL,  ESR WNL, protein creatinine ratio WNL.  The following lab work will be obtained today for further evaluation.  She remain on the current treatment regimen.  She was advised to notify us if she starts to have more recurrent flares. She will follow up in 5 months.  - Plan: CBC with Differential/Platelet, COMPLETE METABOLIC PANEL WITH GFR, Protein / creatinine ratio, urine, Anti-DNA antibody, double-stranded, C3 and C4, Sedimentation rate  High risk medication use - Plaquenil 200 mg 1 tablet in the morning and 1/2 tablet in the evening, Methotrexate 5 tablets by mouth once a week, and folic acid 1 mg daily.  PLQ eye exam: 04/14/2021 WNL at Dr Myna Hidalgo. Hulan Saas, OD Follow up in 1 year. She has her plaquenil eye examination scheduled.  She was given a Plaquenil eye exam form to take with her to her next appointment. CBC and CMP updated on 03/16/22.  CBC and CMP will be drawn today to monitor for drug toxicity.  Her next lab work will be due in August and every 3  months to monitor for drug toxicity.  - Plan: CBC with Differential/Platelet, COMPLETE METABOLIC PANEL WITH GFR She has not had any recent infections.  Discussed the importance of holding methotrexate if she develops signs or symptoms of infection and to resume once the  infection is completely cleared.  Pain in left foot -She presents today with left great toe pain which has been worsening over the past 2 weeks ago after hitting her toe on a stool.  She has not noticed any bruising, redness, or swelling.  Her pain has been sporadic and fleeting.   X-rays of the left great toe were obtained today for further evaluation.  Plan: XR Toe Great Left  Raynaud's disease without gangrene: Affecting her toes: No digital ulcerations or signs of gangrene.   Right carpal tunnel syndrome: She has intermittent paresthesias in the right hand.  She has been painting recently which has exacerbated her symptoms.  She wears a brace as needed.  Other medical conditions are listed as follows:   Achilles tendinitis of right lower extremity - Resolved.   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  Followed by Dr. Durward Fortes.   History of fatigue  History of melanoma  History of hypertension  History of asthma  History of hypothyroidism  History of sleep apnea  Pitting edema  Osteoporosis screening - DEXA ordered by PCP-normal according to the patient.   Orders: Orders Placed This Encounter  Procedures   XR Toe Great Left   CBC with Differential/Platelet   COMPLETE METABOLIC PANEL WITH GFR   Protein / creatinine ratio, urine   Anti-DNA antibody, double-stranded   C3 and C4   Sedimentation rate   No orders of the defined types were placed in this encounter.    Follow-Up Instructions: Return in about 5 months (around 10/09/2022) for Autoimmune  Disease.   Ofilia Neas, PA-C  Note - This record has been created using Dragon software.  Chart creation errors have been sought, but may not always  have been located. Such creation errors do not reflect on  the standard of medical care.

## 2022-05-06 ENCOUNTER — Other Ambulatory Visit: Payer: Self-pay | Admitting: *Deleted

## 2022-05-06 NOTE — Telephone Encounter (Signed)
Next Visit: 05/09/2022  Last Visit: 12/08/2022  Last Fill: 07/14/2021  DX: Autoimmune disease   Current Dose per office note 12/08/2021: Methotrexate 5 tablets by mouth once a week  Labs: 03/16/2022, CBC and CMP normal.  Glucose is mildly elevated, probably not a fasting sample.  Okay to refill MTX?

## 2022-05-08 MED ORDER — METHOTREXATE 2.5 MG PO TABS: ORAL_TABLET | ORAL | 0 refills | Status: DC

## 2022-05-09 ENCOUNTER — Encounter: Payer: Self-pay | Admitting: Physician Assistant

## 2022-05-09 ENCOUNTER — Ambulatory Visit: Payer: BC Managed Care – PPO | Admitting: Physician Assistant

## 2022-05-09 ENCOUNTER — Ambulatory Visit (INDEPENDENT_AMBULATORY_CARE_PROVIDER_SITE_OTHER): Payer: BC Managed Care – PPO

## 2022-05-09 VITALS — BP 146/84 | HR 82 | Ht 63.0 in | Wt 198.2 lb

## 2022-05-09 DIAGNOSIS — Z8639 Personal history of other endocrine, nutritional and metabolic disease: Secondary | ICD-10-CM

## 2022-05-09 DIAGNOSIS — Z79899 Other long term (current) drug therapy: Secondary | ICD-10-CM

## 2022-05-09 DIAGNOSIS — M79672 Pain in left foot: Secondary | ICD-10-CM | POA: Diagnosis not present

## 2022-05-09 DIAGNOSIS — G5601 Carpal tunnel syndrome, right upper limb: Secondary | ICD-10-CM

## 2022-05-09 DIAGNOSIS — Z8709 Personal history of other diseases of the respiratory system: Secondary | ICD-10-CM

## 2022-05-09 DIAGNOSIS — R609 Edema, unspecified: Secondary | ICD-10-CM

## 2022-05-09 DIAGNOSIS — M359 Systemic involvement of connective tissue, unspecified: Secondary | ICD-10-CM | POA: Diagnosis not present

## 2022-05-09 DIAGNOSIS — I7300 Raynaud's syndrome without gangrene: Secondary | ICD-10-CM

## 2022-05-09 DIAGNOSIS — Z8679 Personal history of other diseases of the circulatory system: Secondary | ICD-10-CM

## 2022-05-09 DIAGNOSIS — Z85820 Personal history of malignant melanoma of skin: Secondary | ICD-10-CM

## 2022-05-09 DIAGNOSIS — M6788 Other specified disorders of synovium and tendon, other site: Secondary | ICD-10-CM

## 2022-05-09 DIAGNOSIS — Z87898 Personal history of other specified conditions: Secondary | ICD-10-CM

## 2022-05-09 DIAGNOSIS — M7661 Achilles tendinitis, right leg: Secondary | ICD-10-CM

## 2022-05-09 DIAGNOSIS — Z8669 Personal history of other diseases of the nervous system and sense organs: Secondary | ICD-10-CM

## 2022-05-09 DIAGNOSIS — Z13820 Encounter for screening for osteoporosis: Secondary | ICD-10-CM

## 2022-05-10 LAB — COMPLETE METABOLIC PANEL WITH GFR
AG Ratio: 1.5 (calc) (ref 1.0–2.5)
ALT: 14 U/L (ref 6–29)
AST: 24 U/L (ref 10–35)
Albumin: 4.1 g/dL (ref 3.6–5.1)
Alkaline phosphatase (APISO): 83 U/L (ref 37–153)
BUN: 15 mg/dL (ref 7–25)
CO2: 32 mmol/L (ref 20–32)
Calcium: 9.8 mg/dL (ref 8.6–10.4)
Chloride: 103 mmol/L (ref 98–110)
Creat: 0.93 mg/dL (ref 0.50–1.05)
Globulin: 2.7 g/dL (calc) (ref 1.9–3.7)
Glucose, Bld: 95 mg/dL (ref 65–99)
Potassium: 4.2 mmol/L (ref 3.5–5.3)
Sodium: 142 mmol/L (ref 135–146)
Total Bilirubin: 0.4 mg/dL (ref 0.2–1.2)
Total Protein: 6.8 g/dL (ref 6.1–8.1)
eGFR: 69 mL/min/{1.73_m2} (ref >=60)

## 2022-05-10 LAB — CBC WITH DIFFERENTIAL/PLATELET
Absolute Monocytes: 554 cells/uL (ref 200–950)
Basophils Absolute: 46 cells/uL (ref 0–200)
Basophils Relative: 0.6 %
Eosinophils Absolute: 200 cells/uL (ref 15–500)
Eosinophils Relative: 2.6 %
HCT: 45.3 % — ABNORMAL HIGH (ref 35.0–45.0)
Hemoglobin: 15.2 g/dL (ref 11.7–15.5)
Lymphs Abs: 2687 cells/uL (ref 850–3900)
MCH: 30.6 pg (ref 27.0–33.0)
MCHC: 33.6 g/dL (ref 32.0–36.0)
MCV: 91.3 fL (ref 80.0–100.0)
MPV: 9.6 fL (ref 7.5–12.5)
Monocytes Relative: 7.2 %
Neutro Abs: 4212 cells/uL (ref 1500–7800)
Neutrophils Relative %: 54.7 %
Platelets: 323 10*3/uL (ref 140–400)
RBC: 4.96 10*6/uL (ref 3.80–5.10)
RDW: 13.6 % (ref 11.0–15.0)
Total Lymphocyte: 34.9 %
WBC: 7.7 10*3/uL (ref 3.8–10.8)

## 2022-05-10 LAB — ANTI-DNA ANTIBODY, DOUBLE-STRANDED: ds DNA Ab: 2 IU/mL

## 2022-05-10 LAB — PROTEIN / CREATININE RATIO, URINE
Creatinine, Urine: 63 mg/dL (ref 20–275)
Protein/Creat Ratio: 95 mg/g creat (ref 24–184)
Protein/Creatinine Ratio: 0.095 mg/mg creat (ref 0.024–0.184)
Total Protein, Urine: 6 mg/dL (ref 5–24)

## 2022-05-10 LAB — SEDIMENTATION RATE: Sed Rate: 2 mm/h (ref 0–30)

## 2022-05-10 LAB — C3 AND C4
C3 Complement: 145 mg/dL (ref 83–193)
C4 Complement: 29 mg/dL (ref 15–57)

## 2022-05-10 NOTE — Progress Notes (Signed)
CBC and CMP WNL.  Complements WNL.   Protein creatinine ratio WNL.  ESR WNL.

## 2022-05-10 NOTE — Progress Notes (Signed)
X-rays consistent with osteoarthritic changes.  No acute abnormalities.

## 2022-05-11 NOTE — Progress Notes (Signed)
dsDNA is negative.  Labs are not consistent with a flare.

## 2022-06-08 ENCOUNTER — Other Ambulatory Visit: Payer: Self-pay | Admitting: Physician Assistant

## 2022-06-08 DIAGNOSIS — M359 Systemic involvement of connective tissue, unspecified: Secondary | ICD-10-CM

## 2022-06-08 NOTE — Telephone Encounter (Signed)
Next Visit: 10/13/2022  Last Visit: 05/09/2022  Labs: 05/09/2022, CBC and CMP WNL.  Complements WNL.   Protein creatinine ratio WNL.  ESR WNL.    Eye exam: 05/11/2022   Current Dose per office note 05/09/2022: - Plaquenil 200 mg 1 tablet in the morning and 1/2 tablet in the evening  DX: Autoimmune disease   Last Fill: 03/10/2022  Okay to refill Plaquenil?

## 2022-06-10 ENCOUNTER — Other Ambulatory Visit: Payer: Self-pay | Admitting: *Deleted

## 2022-07-13 ENCOUNTER — Ambulatory Visit: Payer: BC Managed Care – PPO | Admitting: Internal Medicine
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
## 2022-07-14 ENCOUNTER — Encounter: Payer: Self-pay | Admitting: Internal Medicine

## 2022-07-14 ENCOUNTER — Ambulatory Visit: Payer: BC Managed Care – PPO | Admitting: Internal Medicine

## 2022-07-14 VITALS — BP 122/70 | HR 78 | Ht 62.5 in | Wt 199.2 lb

## 2022-07-14 DIAGNOSIS — J3089 Other allergic rhinitis: Secondary | ICD-10-CM | POA: Diagnosis not present

## 2022-07-14 DIAGNOSIS — J4520 Mild intermittent asthma, uncomplicated: Secondary | ICD-10-CM

## 2022-07-14 DIAGNOSIS — J302 Other seasonal allergic rhinitis: Secondary | ICD-10-CM | POA: Diagnosis not present

## 2022-07-14 LAB — CBC WITH DIFFERENTIAL/PLATELET
Basophils Absolute: 0.1 10*3/uL (ref 0.0–0.1)
Basophils Relative: 1 % (ref 0.0–3.0)
Eosinophils Absolute: 0.2 10*3/uL (ref 0.0–0.7)
Eosinophils Relative: 2.9 % (ref 0.0–5.0)
HCT: 42.2 % (ref 36.0–46.0)
Hemoglobin: 14 g/dL (ref 12.0–15.0)
Lymphocytes Relative: 33.1 % (ref 12.0–46.0)
Lymphs Abs: 2.4 10*3/uL (ref 0.7–4.0)
MCHC: 33.3 g/dL (ref 30.0–36.0)
MCV: 91.5 fl (ref 78.0–100.0)
Monocytes Absolute: 0.6 10*3/uL (ref 0.1–1.0)
Monocytes Relative: 8 % (ref 3.0–12.0)
Neutro Abs: 4 10*3/uL (ref 1.4–7.7)
Neutrophils Relative %: 55 % (ref 43.0–77.0)
Platelets: 305 10*3/uL (ref 150.0–400.0)
RBC: 4.61 Mil/uL (ref 3.87–5.11)
RDW: 14.6 % (ref 11.5–15.5)
WBC: 7.3 10*3/uL (ref 4.0–10.5)

## 2022-07-14 MED ORDER — ALBUTEROL SULFATE HFA 108 (90 BASE) MCG/ACT IN AERS: 2.0000 | INHALATION_SPRAY | Freq: Four times a day (QID) | RESPIRATORY_TRACT | 12 refills | Status: AC | PRN

## 2022-07-14 MED ORDER — FLUTICASONE PROPIONATE 50 MCG/ACT NA SUSP
NASAL | 12 refills | Status: DC
Start: 2022-07-14 — End: 2023-08-17

## 2022-07-14 NOTE — Assessment & Plan Note (Signed)
Generally doing well. Periodic exposure to smoke when she goes to [place] needs watching. Plan- refill rescue inhaler. Continue current meds. Check IgE, Eos for potential ise of Biologic Interleukin therapy in future.

## 2022-07-14 NOTE — Assessment & Plan Note (Signed)
Note past hx of reported nasal polyps, not currently significant.

## 2022-07-14 NOTE — Patient Instructions (Addendum)
Albuterol rescue inhaler and flonase refilled  Order- lab- CBC w diff, IgE   dx Allergic Rhinitis, Asthma moderate persistent  Please call if we can help

## 2022-07-15 ENCOUNTER — Other Ambulatory Visit: Payer: Self-pay | Admitting: Neurology

## 2022-07-15 ENCOUNTER — Other Ambulatory Visit: Payer: Self-pay | Admitting: Rheumatology

## 2022-07-15 DIAGNOSIS — G4719 Other hypersomnia: Secondary | ICD-10-CM

## 2022-07-15 LAB — IGE: IgE (Immunoglobulin E), Serum: 18 kU/L (ref <=114)

## 2022-07-15 NOTE — Telephone Encounter (Signed)
Next Visit: 10/13/2022   Last Visit: 05/09/2022   Labs: 05/09/2022, CBC and CMP WNL.  Complements WNL.  Protein creatinine ratio WNL.  ESR WNL.    Current Dose per office note 05/09/2022: Methotrexate 5 tablets by mouth once a week   DX: Autoimmune disease    Last Fill: 05/08/2022  Okay to refill MTX?

## 2022-07-18 ENCOUNTER — Encounter: Payer: Self-pay | Admitting: Neurology

## 2022-07-18 ENCOUNTER — Other Ambulatory Visit: Payer: Self-pay

## 2022-07-18 DIAGNOSIS — G4719 Other hypersomnia: Secondary | ICD-10-CM

## 2022-07-18 MED ORDER — MODAFINIL 100 MG PO TABS: ORAL_TABLET | ORAL | 3 refills | Status: DC

## 2022-07-18 NOTE — Progress Notes (Signed)
Verify Drug Registry For Modafinil 100 Mg Tablet Last Filled: 05/25/2022 Quantity: 45 tablets for 30 days Last appointment: 03/22/2022 Next appointment: 03/23/2023

## 2022-07-18 NOTE — Telephone Encounter (Signed)
Last OV was on 03/22/22.  Next OV is scheduled for 03/23/23.  Last RX was written on 05/25/22 for 45 tabs.   [place] Drug Database has been reviewed.

## 2022-07-19 ENCOUNTER — Ambulatory Visit: Payer: BC Managed Care – PPO | Admitting: Orthopedic Surgery

## 2022-07-19 DIAGNOSIS — M2022 Hallux rigidus, left foot: Secondary | ICD-10-CM

## 2022-07-19 DIAGNOSIS — M6702 Short Achilles tendon (acquired), left ankle: Secondary | ICD-10-CM | POA: Diagnosis not present

## 2022-07-20 ENCOUNTER — Encounter: Payer: Self-pay | Admitting: Orthopedic Surgery

## 2022-07-20 NOTE — Progress Notes (Signed)
Office Visit Note   Patient: Misty Curtis           Date of Birth: [birth year]           MRN: [id number] Visit Date: 07/19/2022              Requested by: Philmore Pali, NP [place],  [place] [id number] PCP: Philmore Pali, NP  Chief Complaint  Patient presents with   Left Foot - Pain      HPI: Patient is a 61 year old woman who is seen for initial evaluation of left forefoot pain.  She complains of pain worse in the great toe and second toe metatarsal head she complains of catching and pain with weightbearing.  Patient is wearing flip-flops.  She states she has a history of rheumatoid arthritis for which she is on methotrexate and Plaquenil.  Assessment & Plan: Visit Diagnoses:  1. Hallux rigidus, left foot   2. Achilles tendon contracture, left     Plan: Recommended a stiff soled shoe and stretching.  Achilles stretching was demonstrated she will do this 5 times a day a minute at a time.  Reevaluate in 4 weeks.  Discussed that if this does not resolve with conservative measures the surgical treatment would be a gastrocnemius recession and great toe MTP fusion.  Follow-Up Instructions: Return in about 4 weeks (around 08/16/2022).   Ortho Exam  Patient is alert, oriented, no adenopathy, well-dressed, normal affect, normal respiratory effort. Examination patient has a good dorsalis pedis pulse.  Her foot is plantigrade she has good ankle and subtalar motion.  With her knee extended she has dorsiflexion of the ankle to neutral with Achilles contracture.  Patient has hallux rigidus of the great toe with 0 degrees of dorsiflexion and 20 degrees of plantarflexion.  Patient has pain to palpation dorsally over the great toe MTP joint.  Review of the radiographs of the right great toe shows joint space narrowing and periarticular osteophytic bone spurs of the left great toe MTP joint with a dorsal osteophytic bone spur as well.  Imaging: No results found. No images are attached  to the encounter.  Labs: Lab Results  Component Value Date   ESRSEDRATE 2 05/09/2022   ESRSEDRATE 9 12/03/2021   ESRSEDRATE 2 07/14/2021     Lab Results  Component Value Date   ALBUMIN 4.1 06/02/2017   ALBUMIN 4.0 03/24/2017   ALBUMIN 4.1 12/08/2016    No results found for: "MG" Lab Results  Component Value Date   VD25OH 44 04/11/2018    No results found for: "PREALBUMIN"    Latest Ref Rng & Units 07/14/2022   11:03 AM 05/09/2022    9:57 AM 03/16/2022    2:35 PM  CBC EXTENDED  WBC 4.0 - 10.5 K/uL 7.3  7.7  9.4   RBC 3.87 - 5.11 Mil/uL 4.61  4.96  4.82   Hemoglobin 12.0 - 15.0 g/dL 14.0  15.2  14.6   HCT 36.0 - 46.0 % 42.2  45.3  42.9   Platelets 150.0 - 400.0 K/uL 305.0  323  322   NEUT# 1.4 - 7.7 K/uL 4.0  4,212  5,659   Lymph# 0.7 - 4.0 K/uL 2.4  2,687  2,989      There is no height or weight on file to calculate BMI.  Orders:  No orders of the defined types were placed in this encounter.  No orders of the defined types were placed in this encounter.  Procedures: No procedures performed  Clinical Data: No additional findings.  ROS:  All other systems negative, except as noted in the HPI. Review of Systems  Objective: Vital Signs: There were no vitals taken for this visit.  Specialty Comments:  No specialty comments available.  PMFS History: Patient Active Problem List   Diagnosis Date Noted   Heel pain, chronic, left 09/30/2020   Achilles tendinitis, left leg 08/19/2020   Heel pain, chronic, right 05/02/2019   Thrush 06/11/2018   Acquired hypothyroidism 12/08/2016   Autoimmune disease ([place]) 12/06/2016   Other fatigue 12/06/2016   Raynaud's disease without gangrene 12/06/2016   High risk medication use 12/06/2016   Allergic asthma, mild intermittent, uncomplicated 63/84/5364   History of hypertension 12/06/2016   Excessive daytime sleepiness 07/26/2016   Snoring 07/26/2016   Laryngitis 11/10/2012   Postnasal drip 05/02/2012   Acute  sinusitis, unspecified 10/24/2011   HYPERTENSION 12/14/2009   BRONCHITIS 12/14/2009   LUPUS 12/14/2009   History of melanoma 12/14/2009   Seasonal and perennial allergic rhinitis 12/14/2009   Past Medical History:  Diagnosis Date   Allergic rhinitis    skin test 01-18-10   Bronchitis    Lupus (systemic lupus erythematosus) (HCC)    joints and skin rash. Dr. Estanislado Pandy   Melanoma [facility])    Rhinosinusitis    Skin cancer     Family History  Problem Relation Age of Onset   Stroke Mother    Allergies Father    Heart disease Father        bacterial endocarditis   Hypothyroidism Sister    Hypothyroidism Sister    Hashimoto's thyroiditis Sister    Cancer Maternal Grandmother    Hepatitis Son     Past Surgical History:  Procedure Laterality Date   ABDOMINAL HYSTERECTOMY     ACHILLES TENDON REPAIR Bilateral    Dr. Durward Fortes    BREAST LUMPECTOMY Left    08/21/2019, 09/20/2019   KNEE ARTHROPLASTY     skin cancer extraction     Social History   Occupational History   Occupation: [employer] ed [employer]  Tobacco Use   Smoking status: Never    Passive exposure: Past   Smokeless tobacco: Never  Vaping Use   Vaping Use: Never used  Substance and Sexual Activity   Alcohol use: Yes    Comment: 1 MONTHLY   Drug use: No   Sexual activity: Not on file

## 2022-08-23 ENCOUNTER — Ambulatory Visit: Payer: BC Managed Care – PPO | Admitting: Orthopedic Surgery

## 2022-08-23 DIAGNOSIS — M2022 Hallux rigidus, left foot: Secondary | ICD-10-CM

## 2022-08-23 DIAGNOSIS — M6702 Short Achilles tendon (acquired), left ankle: Secondary | ICD-10-CM

## 2022-08-25 ENCOUNTER — Other Ambulatory Visit: Payer: Self-pay | Admitting: Physician Assistant

## 2022-08-25 ENCOUNTER — Other Ambulatory Visit: Payer: Self-pay | Admitting: Rheumatology

## 2022-08-25 DIAGNOSIS — M359 Systemic involvement of connective tissue, unspecified: Secondary | ICD-10-CM

## 2022-08-25 NOTE — Telephone Encounter (Signed)
Next Visit: 10/13/2022   Last Visit: 05/09/2022   Labs: 05/09/2022, CBC and CMP WNL.  Complements WNL.   Protein creatinine ratio WNL.  ESR WNL.     Eye exam: 05/11/2022    Current Dose per office note 05/09/2022: - Plaquenil 200 mg 1 tablet in the morning and 1/2 tablet in the evening   DX: Autoimmune disease    Last Fill: 06/08/2022   Okay to refill Plaquenil?

## 2022-08-26 NOTE — Telephone Encounter (Signed)
Next Visit: 10/13/2022   Last Visit: 05/09/2022  DX: Autoimmune disease    Last Fill: 01/28/2022  Okay to refill Zofran?

## 2022-08-30 ENCOUNTER — Encounter: Payer: Self-pay | Admitting: Orthopedic Surgery

## 2022-08-30 NOTE — Progress Notes (Signed)
Office Visit Note   Patient: Misty Curtis           Date of Birth: 1961-02-19           MRN: [id number] Visit Date: 08/23/2022              Requested by: Philmore Pali, NP [place],  [place] [address] PCP: Philmore Pali, NP  Chief Complaint  Patient presents with   Left Foot - Follow-up      HPI: Patient is a 61 year old woman who presents follow-up for hallux rigidus left great toe and Achilles contracture on the left.  Patient has been working on Achilles stretching and stiff soled shoes.  Patient states she is feeling better.  Assessment & Plan: Visit Diagnoses:  1. Hallux rigidus, left foot   2. Achilles tendon contracture, left     Plan: Continue with current treatment.  Discussed that surgical mention would be a gastrocnemius recession and MTP fusion.    Follow-Up Instructions: Return if symptoms worsen or fail to improve.   Ortho Exam  Patient is alert, oriented, no adenopathy, well-dressed, normal affect, normal respiratory effort.  Patient has dorsiflexion of 20 degrees past neutral bilaterally with good improvement of dorsiflexion.  The great toe has no pain with passive range of motion.  She has a good stiff pair of sneakers   Imaging: No results found. No images are attached to the encounter.  Labs: Lab Results  Component Value Date   ESRSEDRATE 2 05/09/2022   ESRSEDRATE 9 12/03/2021   ESRSEDRATE 2 07/14/2021     Lab Results  Component Value Date   ALBUMIN 4.1 06/02/2017   ALBUMIN 4.0 03/24/2017   ALBUMIN 4.1 12/08/2016    No results found for: "MG" Lab Results  Component Value Date   VD25OH 44 04/11/2018    No results found for: "PREALBUMIN"    Latest Ref Rng & Units 07/14/2022   11:03 AM 05/09/2022    9:57 AM 03/16/2022    2:35 PM  CBC EXTENDED  WBC 4.0 - 10.5 K/uL 7.3  7.7  9.4   RBC 3.87 - 5.11 Mil/uL 4.61  4.96  4.82   Hemoglobin 12.0 - 15.0 g/dL 14.0  15.2  14.6   HCT 36.0 - 46.0 % 42.2  45.3  42.9   Platelets 150.0 -  400.0 K/uL 305.0  323  322   NEUT# 1.4 - 7.7 K/uL 4.0  4,212  5,659   Lymph# 0.7 - 4.0 K/uL 2.4  2,687  2,989      There is no height or weight on file to calculate BMI.  Orders:  No orders of the defined types were placed in this encounter.  No orders of the defined types were placed in this encounter.    Procedures: No procedures performed  Clinical Data: No additional findings.  ROS:  All other systems negative, except as noted in the HPI. Review of Systems  Objective: Vital Signs: There were no vitals taken for this visit.  Specialty Comments:  No specialty comments available.  PMFS History: Patient Active Problem List   Diagnosis Date Noted   Heel pain, chronic, left 09/30/2020   Achilles tendinitis, left leg 08/19/2020   Heel pain, chronic, right 05/02/2019   Thrush 06/11/2018   Acquired hypothyroidism 12/08/2016   Autoimmune disease ([place]) 12/06/2016   Other fatigue 12/06/2016   Raynaud's disease without gangrene 12/06/2016   High risk medication use 12/06/2016   Allergic asthma, mild intermittent, uncomplicated  12/06/2016   History of hypertension 12/06/2016   Excessive daytime sleepiness 07/26/2016   Snoring 07/26/2016   Laryngitis 11/10/2012   Postnasal drip 05/02/2012   Acute sinusitis, unspecified 10/24/2011   HYPERTENSION 12/14/2009   BRONCHITIS 12/14/2009   LUPUS 12/14/2009   History of melanoma 12/14/2009   Seasonal and perennial allergic rhinitis 12/14/2009   Past Medical History:  Diagnosis Date   Allergic rhinitis    skin test 01-18-10   Bronchitis    Lupus (systemic lupus erythematosus) (HCC)    joints and skin rash. Dr. Estanislado Pandy   [facility])    Rhinosinusitis    Skin cancer     Family History  Problem Relation Age of Onset   Stroke Mother    Allergies Father    Heart disease Father        bacterial endocarditis   Hypothyroidism Sister    Hypothyroidism Sister    Hashimoto's thyroiditis Sister    Cancer Maternal  Grandmother    Hepatitis Son     Past Surgical History:  Procedure Laterality Date   ABDOMINAL HYSTERECTOMY     ACHILLES TENDON REPAIR Bilateral    Dr. Durward Fortes    BREAST LUMPECTOMY Left    08/21/2019, 09/20/2019   KNEE ARTHROPLASTY     skin cancer extraction     Social History   Occupational History   Occupation: special ed [employer]  Tobacco Use   Smoking status: Never    Passive exposure: Past   Smokeless tobacco: Never  Vaping Use   Vaping Use: Never used  Substance and Sexual Activity   Alcohol use: Yes    Comment: 1 MONTHLY   Drug use: No   Sexual activity: Not on file

## 2022-09-11 ENCOUNTER — Other Ambulatory Visit: Payer: Self-pay | Admitting: Internal Medicine

## 2022-09-29 NOTE — Progress Notes (Signed)
Office Visit Note  Patient: Misty Curtis             Date of Birth: 06-27-1961           MRN: [id number]             PCP: Philmore Pali, NP (Inactive) Referring: Philmore Pali, NP Visit Date: 10/13/2022 Occupation: '@GUAROCC'$ @  Subjective:  Joint pain and stiffness  History of Present Illness: Misty Curtis is a 61 y.o. female with history of autoimmune disease and osteoarthritis.  She was evaluated by Dr. Due to for ongoing discomfort in her left foot.  After evaluation he diagnosed her with hallux rigidus and contracture of the left Achilles tendon.  He recommended hard soled shoes which has been helpful.  She has been also doing exercising and stretching which has helped.  Denies any joint discomfort today.  She continues to have fatigue, dry mouth and dry eyes, infrequent oral ulcers, raynauds phenomenon and photosensitivity.  She denies any history of malar rash, and lymphadenopathy.  Activities of Daily Living:  Patient reports morning stiffness for 30 minutes.   Patient Reports nocturnal pain.  Difficulty dressing/grooming: Denies Difficulty climbing stairs: Reports Difficulty getting out of chair: Denies Difficulty using hands for taps, buttons, cutlery, and/or writing: Reports  Review of Systems  Constitutional:  Positive for fatigue.  HENT:  Positive for mouth dryness. Negative for mouth sores.   Eyes:  Positive for dryness.  Respiratory:  Negative for shortness of breath.   Cardiovascular:  Negative for chest pain and palpitations.  Gastrointestinal:  Positive for diarrhea. Negative for blood in stool and constipation.  Endocrine: Positive for increased urination.  Genitourinary:  Positive for involuntary urination.  Musculoskeletal:  Positive for joint pain, joint pain, joint swelling, myalgias, muscle weakness, morning stiffness, muscle tenderness and myalgias. Negative for gait problem.  Skin:  Positive for color change, hair loss and sensitivity to sunlight. Negative for rash.   Allergic/Immunologic: Positive for susceptible to infections.  Neurological:  Negative for dizziness and headaches.  Hematological:  Negative for swollen glands.  Psychiatric/Behavioral:  Negative for depressed mood and sleep disturbance. The patient is not nervous/anxious.     PMFS History:  Patient Active Problem List   Diagnosis Date Noted   Heel pain, chronic, left 09/30/2020   Achilles tendinitis, left leg 08/19/2020   Heel pain, chronic, right 05/02/2019   Thrush 06/11/2018   Acquired hypothyroidism 12/08/2016   Autoimmune disease ([place]) 12/06/2016   Other fatigue 12/06/2016   Raynaud's disease without gangrene 12/06/2016   High risk medication use 12/06/2016   Allergic asthma, mild intermittent, uncomplicated 56/43/3295   History of hypertension 12/06/2016   Excessive daytime sleepiness 07/26/2016   Snoring 07/26/2016   Laryngitis 11/10/2012   Postnasal drip 05/02/2012   Acute sinusitis, unspecified 10/24/2011   HYPERTENSION 12/14/2009   BRONCHITIS 12/14/2009   LUPUS 12/14/2009   History of melanoma 12/14/2009   Seasonal and perennial allergic rhinitis 12/14/2009    Past Medical History:  Diagnosis Date   Allergic rhinitis    skin test 01-18-10   Bronchitis    Lupus (systemic lupus erythematosus) (HCC)    joints and skin rash. Dr. Estanislado Pandy   Melanoma [facility])    Rhinosinusitis    Skin cancer     Family History  Problem Relation Age of Onset   Stroke Mother    Allergies Father    Heart disease Father        bacterial endocarditis   Hypothyroidism Sister  Hypothyroidism Sister    Hashimoto's thyroiditis Sister    Cancer Maternal Grandmother    Hepatitis Son    Past Surgical History:  Procedure Laterality Date   ABDOMINAL HYSTERECTOMY     ACHILLES TENDON REPAIR Bilateral    Dr. Durward Fortes    BREAST LUMPECTOMY Left    08/21/2019, 09/20/2019   KNEE ARTHROPLASTY     skin cancer extraction     skin cancer extraction  2023   right side of face   Social  History   Social History Narrative   Not on file   Immunization History  Administered Date(s) Administered   Influenza Split 09/19/2011, 09/12/2012, 09/02/2013   Influenza-Unspecified 10/19/2014   Moderna Sars-Covid-2 Vaccination 02/26/2020, 03/25/2020, 12/09/2020   Pneumococcal Polysaccharide-23 08/19/2010   Pneumococcal-Unspecified 07/01/2020     Objective: Vital Signs: BP (!) 142/80 (BP Location: Left Arm, Patient Position: Sitting, Cuff Size: Normal)   Pulse 81   Resp 16   Ht '5\' 2"'$  (1.575 m)   Wt 201 lb 3.2 oz (91.3 kg)   BMI 36.80 kg/m    Physical Exam Vitals and nursing note reviewed.  Constitutional:      Appearance: She is well-developed.  HENT:     Head: Normocephalic and atraumatic.  Eyes:     Conjunctiva/sclera: Conjunctivae normal.  Cardiovascular:     Rate and Rhythm: Normal rate and regular rhythm.     Heart sounds: Normal heart sounds.  Pulmonary:     Effort: Pulmonary effort is normal.     Breath sounds: Normal breath sounds.  Abdominal:     General: Bowel sounds are normal.     Palpations: Abdomen is soft.  Musculoskeletal:     Cervical back: Normal range of motion.  Lymphadenopathy:     Cervical: No cervical adenopathy.  Skin:    General: Skin is warm and dry.     Capillary Refill: Capillary refill takes less than 2 seconds.  Neurological:     Mental Status: She is alert and oriented to person, place, and time.  Psychiatric:        Behavior: Behavior normal.      Musculoskeletal Exam: Cervical spine was in good range of motion.  Postural thoracic kyphosis was noted.  Shoulder joints, elbow joints, wrist joints, MCPs PIPs and DIPs with good range of motion with no synovitis.  PIP and DIP thickening with no synovitis was noted.  Hip joints and knee joints with good range of motion without any warmth swelling or effusion.  There was no tenderness over ankles or MTPs.  CDAI Exam: CDAI Score: -- Patient Global: --; Provider Global: -- Swollen:  --; Tender: -- Joint Exam 10/13/2022   No joint exam has been documented for this visit   There is currently no information documented on the homunculus. Go to the Rheumatology activity and complete the homunculus joint exam.  Investigation: No additional findings.  Imaging: No results found.  Recent Labs: Lab Results  Component Value Date   WBC 7.3 07/14/2022   HGB 14.0 07/14/2022   PLT 305.0 07/14/2022   NA 142 05/09/2022   K 4.2 05/09/2022   CL 103 05/09/2022   CO2 32 05/09/2022   GLUCOSE 95 05/09/2022   BUN 15 05/09/2022   CREATININE 0.93 05/09/2022   BILITOT 0.4 05/09/2022   ALKPHOS 82 06/02/2017   AST 24 05/09/2022   ALT 14 05/09/2022   PROT 6.8 05/09/2022   ALBUMIN 4.1 06/02/2017   CALCIUM 9.8 05/09/2022   GFRAA 70 04/12/2021  Speciality Comments: PLQ eye exam: 05/11/2022 WNL at Dr Myna Hidalgo. Hulan Saas, OD Follow up in 1 year   Procedures:  No procedures performed Allergies: Latex, Capsicum annuum extract & derivative (bell pepper) [capsicum], Cat hair extract, Lactose intolerance (gi), Milk-related compounds, Neomycin-bacitracin zn-polymyx, Onion, Other, Strawberry extract, and Wixela inhub [fluticasone-salmeterol]   Assessment / Plan:     Visit Diagnoses: Autoimmune disease ([place]) - History of positive ANA, DS DNA, Raynauds, fatigue, oral ulcers, nasal ulcers, arthralgias, sicca: -Patient states that she continues to have fatigue, sicca symptoms, infrequent oral ulcers, Raynaud's and joint pain.  She has not noticed any joint swelling.  She is also noticed recent hair loss.  She has been taking Plaquenil and methotrexate on a regular basis.  She believes that methotrexate helped her with her joint swelling and inflammation.  Her autoimmune labs have been negative for a while.  I did detailed discussion with the patient about tapering her medications over time.  She was in agreement.  We will reduce hydroxychloroquine to 1 tablet p.o. daily.  If her labs are still stable  we may decrease it to Plaquenil 1 tablet every other day in the future.  She has been on Plaquenil for many years.  Plan: Protein / creatinine ratio, urine, Anti-DNA antibody, double-stranded, C3 and C4, Sedimentation rate  High risk medication use - Plaquenil 200 mg 1 tablet in the morning and 1/2 tablet in the evening, Methotrexate 5 tablets by mouth once a week, and folic acid 1 mg daily.  Labs obtained on May 620-second 2023 CBC and CMP were normal.  Left findings were reviewed with the patient.  We will check labs today.  She was advised to get labs every 3 months to monitor for drug toxicity.  Information on immunization was placed in the AVS.  She was advised to hold methotrexate if she develops an infection and resume after the infection resolves.  Plan, CBC with Differential/Platelet, COMPLETE METABOLIC PANEL WITH GFR,  Raynaud's disease without gangrene-patient says she still have Raynauds symptoms.  She had good capillary refill.  No nailbed capillary changes or sclerodactyly was noted.  Right carpal tunnel syndrome-she has intermittent symptoms.  Pain in left foot-she is evaluated by Dr. Sharol Given who diagnosed her with left foot hallux rigidus and Achilles tendon shortening.  She states her symptoms improved after doing the stretching exercises and wearing hard soled shoes.  Achilles tendinitis of right lower extremity - Resolved.   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   History of fatigue-she can get history of fatigue.  History of hypertension-blood pressure was elevated today.  Advised her to monitor blood pressure closely and follow-up with the PCP if her blood pressure stays elevated.  History of melanoma  History of asthma  History of hypothyroidism  History of sleep apnea  Pitting edema  Osteoporosis screening - DEXA  ordered by PCP-normal according to the patient.   Orders: Orders Placed This Encounter  Procedures   Protein / creatinine ratio, urine   CBC with Differential/Platelet   COMPLETE METABOLIC PANEL WITH GFR   Anti-DNA antibody, double-stranded   C3 and C4   Sedimentation rate   Meds ordered this encounter  Medications   methotrexate (RHEUMATREX) 2.5 MG tablet    Sig: Take 5 tablets (12.5 mg total) by mouth once a week. Caution:Chemotherapy. Protect from light.    Dispense:  60 tablet  Refill:  0   diclofenac Sodium (VOLTAREN) 1 % GEL    Sig: Apply 2-4 grams to affected joint 4 times daily as needed.    Dispense:  400 g    Refill:  2   hydroxychloroquine (PLAQUENIL) 200 MG tablet    Sig: Take 1 tablet (200 mg total) by mouth daily.    Dispense:  90 tablet    Refill:  0   .  Follow-Up Instructions: Return in about 5 months (around 03/14/2023) for Autoimmune disease.   Bo Merino, MD  Note - This record has been created using Editor, commissioning.  Chart creation errors have been sought, but may not always  have been located. Such creation errors do not reflect on  the standard of medical care.

## 2022-10-12 ENCOUNTER — Other Ambulatory Visit: Payer: Self-pay | Admitting: Rheumatology

## 2022-10-13 ENCOUNTER — Ambulatory Visit: Payer: BC Managed Care – PPO | Attending: Rheumatology | Admitting: Rheumatology

## 2022-10-13 ENCOUNTER — Encounter: Payer: Self-pay | Admitting: Rheumatology

## 2022-10-13 VITALS — BP 142/80 | HR 81 | Resp 16 | Ht 62.0 in | Wt 201.2 lb

## 2022-10-13 DIAGNOSIS — Z8709 Personal history of other diseases of the respiratory system: Secondary | ICD-10-CM

## 2022-10-13 DIAGNOSIS — M7661 Achilles tendinitis, right leg: Secondary | ICD-10-CM

## 2022-10-13 DIAGNOSIS — Z87898 Personal history of other specified conditions: Secondary | ICD-10-CM

## 2022-10-13 DIAGNOSIS — Z8679 Personal history of other diseases of the circulatory system: Secondary | ICD-10-CM

## 2022-10-13 DIAGNOSIS — Z79899 Other long term (current) drug therapy: Secondary | ICD-10-CM | POA: Diagnosis not present

## 2022-10-13 DIAGNOSIS — M79672 Pain in left foot: Secondary | ICD-10-CM

## 2022-10-13 DIAGNOSIS — I7300 Raynaud's syndrome without gangrene: Secondary | ICD-10-CM

## 2022-10-13 DIAGNOSIS — Z85820 Personal history of malignant melanoma of skin: Secondary | ICD-10-CM

## 2022-10-13 DIAGNOSIS — M359 Systemic involvement of connective tissue, unspecified: Secondary | ICD-10-CM | POA: Diagnosis not present

## 2022-10-13 DIAGNOSIS — M6788 Other specified disorders of synovium and tendon, other site: Secondary | ICD-10-CM

## 2022-10-13 DIAGNOSIS — R609 Edema, unspecified: Secondary | ICD-10-CM

## 2022-10-13 DIAGNOSIS — G5601 Carpal tunnel syndrome, right upper limb: Secondary | ICD-10-CM | POA: Diagnosis not present

## 2022-10-13 DIAGNOSIS — Z13820 Encounter for screening for osteoporosis: Secondary | ICD-10-CM

## 2022-10-13 DIAGNOSIS — Z8669 Personal history of other diseases of the nervous system and sense organs: Secondary | ICD-10-CM

## 2022-10-13 DIAGNOSIS — Z8639 Personal history of other endocrine, nutritional and metabolic disease: Secondary | ICD-10-CM

## 2022-10-13 MED ORDER — METHOTREXATE SODIUM 2.5 MG PO TABS: 12.5000 mg | ORAL_TABLET | ORAL | 0 refills | Status: DC

## 2022-10-13 MED ORDER — HYDROXYCHLOROQUINE SULFATE 200 MG PO TABS: 200.0000 mg | ORAL_TABLET | Freq: Every day | ORAL | 0 refills | Status: DC

## 2022-10-13 MED ORDER — DICLOFENAC SODIUM 1 % EX GEL: CUTANEOUS | 2 refills | Status: DC

## 2022-10-13 NOTE — Patient Instructions (Signed)
Standing Labs We placed an order today for your standing lab work.   Please have your standing labs drawn in  January and every 3 months  Please have your labs drawn 2 weeks prior to your appointment so that the provider can discuss your lab results at your appointment.  Please note that you may see your imaging and lab results in MyChart before we have reviewed them. We will contact you once all results are reviewed. Please allow our office up to 72 hours to thoroughly review all of the results before contacting the office for clarification of your results.  Lab hours are:   Monday through Thursday from 8:00 am -12:30 pm and 1:00 pm-5:00 pm and Friday from 8:00 am-12:00 pm.  Please be advised, all patients with office appointments requiring lab work will take precedent over walk-in lab work.   Labs are drawn by [facility]. Please bring your co-pay at the time of your lab draw.  You may receive a bill from [facility] for your lab work.  Please note if you are on Hydroxychloroquine and and an order has been placed for a Hydroxychloroquine level, you will need to have it drawn 4 hours or more after your last dose.  If you wish to have your labs drawn at another location, please call the office 24 hours in advance so we can fax the orders.  The office is located at [address], [address], [place], [place] [address] No appointment is necessary.    If you have any questions regarding directions or hours of operation,  please call [phone].   As a reminder, please drink plenty of water prior to coming for your lab work. Thanks!   Vaccines You are taking a medication(s) that can suppress your immune system.  The following immunizations are recommended: Flu annually Covid-19  Td/Tdap (tetanus, diphtheria, pertussis) every 10 years Pneumonia (Prevnar 15 then Pneumovax 23 at least 1 year apart.  Alternatively, can take Prevnar 20 without needing additional dose) Shingrix: 2 doses from 4 weeks  to 6 months apart  Please check with your PCP to make sure you are up to date.   If you have signs or symptoms of an infection or start antibiotics: First, call your PCP for workup of your infection. Hold your medication through the infection, until you complete your antibiotics, and until symptoms resolve if you take the following: Injectable medication (Actemra, Benlysta, Cimzia, Cosentyx, Enbrel, Humira, Kevzara, Orencia, Remicade, Simponi, Stelara, Taltz, Tremfya) Methotrexate Leflunomide (Arava) Mycophenolate (Cellcept) Xeljanz, Olumiant, or Rinvoq  

## 2022-10-14 ENCOUNTER — Telehealth: Payer: Self-pay | Admitting: *Deleted

## 2022-10-14 NOTE — Telephone Encounter (Signed)
Submitted a Prior Authorization request to [facility] for  Voltaren Gel  via CoverMyMeds. Will update once we receive a response.

## 2022-10-15 LAB — COMPLETE METABOLIC PANEL WITH GFR
AG Ratio: 1.7 (calc) (ref 1.0–2.5)
ALT: 18 U/L (ref 6–29)
AST: 30 U/L (ref 10–35)
Albumin: 4.4 g/dL (ref 3.6–5.1)
Alkaline phosphatase (APISO): 89 U/L (ref 37–153)
BUN: 16 mg/dL (ref 7–25)
CO2: 31 mmol/L (ref 20–32)
Calcium: 9.9 mg/dL (ref 8.6–10.4)
Chloride: 102 mmol/L (ref 98–110)
Creat: 0.98 mg/dL (ref 0.50–1.05)
Globulin: 2.6 g/dL (calc) (ref 1.9–3.7)
Glucose, Bld: 96 mg/dL (ref 65–99)
Potassium: 3.8 mmol/L (ref 3.5–5.3)
Sodium: 141 mmol/L (ref 135–146)
Total Bilirubin: 0.4 mg/dL (ref 0.2–1.2)
Total Protein: 7 g/dL (ref 6.1–8.1)
eGFR: 65 mL/min/{1.73_m2} (ref >=60)

## 2022-10-15 LAB — CBC WITH DIFFERENTIAL/PLATELET
Absolute Monocytes: 689 cells/uL (ref 200–950)
Basophils Absolute: 42 cells/uL (ref 0–200)
Basophils Relative: 0.5 %
Eosinophils Absolute: 191 cells/uL (ref 15–500)
Eosinophils Relative: 2.3 %
HCT: 45 % (ref 35.0–45.0)
Hemoglobin: 15.4 g/dL (ref 11.7–15.5)
Lymphs Abs: 2482 cells/uL (ref 850–3900)
MCH: 31.1 pg (ref 27.0–33.0)
MCHC: 34.2 g/dL (ref 32.0–36.0)
MCV: 90.9 fL (ref 80.0–100.0)
MPV: 9.6 fL (ref 7.5–12.5)
Monocytes Relative: 8.3 %
Neutro Abs: 4897 cells/uL (ref 1500–7800)
Neutrophils Relative %: 59 %
Platelets: 313 10*3/uL (ref 140–400)
RBC: 4.95 10*6/uL (ref 3.80–5.10)
RDW: 13.2 % (ref 11.0–15.0)
Total Lymphocyte: 29.9 %
WBC: 8.3 10*3/uL (ref 3.8–10.8)

## 2022-10-15 LAB — PROTEIN / CREATININE RATIO, URINE
Creatinine, Urine: 34 mg/dL (ref 20–275)
Total Protein, Urine: <4 mg/dL — ABNORMAL LOW (ref 5–24)

## 2022-10-15 LAB — SEDIMENTATION RATE: Sed Rate: 2 mm/h (ref 0–30)

## 2022-10-15 LAB — C3 AND C4
C3 Complement: 155 mg/dL (ref 83–193)
C4 Complement: 28 mg/dL (ref 15–57)

## 2022-10-15 LAB — ANTI-DNA ANTIBODY, DOUBLE-STRANDED: ds DNA Ab: 1 IU/mL

## 2022-10-15 NOTE — Progress Notes (Signed)
CBC, CMP, ESR, and urine protein are negative. ds DNA,complements are normal.She should stay on Plaquenil 200 mg, 1 tablet daily as discussed during the office visit.

## 2022-10-20 NOTE — Telephone Encounter (Signed)
Received notification from [facility] regarding a prior authorization for diclofenac sodium topical gel 1%. Authorization has been APPROVED from 10/14/2022 to 10/14/2025.   Authorization # [id number]  Knox Saliva, PharmD, MPH, BCPS, CPP Clinical Pharmacist (Rheumatology and Pulmonology)

## 2022-10-21 ENCOUNTER — Encounter: Payer: Self-pay | Admitting: Neurology

## 2022-11-12 ENCOUNTER — Other Ambulatory Visit: Payer: Self-pay | Admitting: Internal Medicine

## 2022-11-29 ENCOUNTER — Encounter: Payer: Self-pay | Admitting: Neurology

## 2022-12-06 ENCOUNTER — Encounter: Payer: Self-pay | Admitting: Rheumatology

## 2022-12-06 ENCOUNTER — Encounter: Payer: Self-pay | Admitting: Internal Medicine

## 2022-12-06 ENCOUNTER — Encounter: Payer: Self-pay | Admitting: Neurology

## 2022-12-13 ENCOUNTER — Telehealth: Payer: Self-pay

## 2022-12-13 NOTE — Telephone Encounter (Signed)
(  Key: [facility])  form thumbnail Your information has been submitted to [employer]. To check for an updated outcome later, reopen this PA request from your dashboard.  If [employer] has not responded to your request within 24 hours, contact [facility] at [phone]. If you think there may be a problem with your PA request, use our live chat feature at the bottom right.

## 2022-12-15 NOTE — Telephone Encounter (Signed)
[facility] approved medication 12/13/22-12/14/2023.

## 2022-12-28 ENCOUNTER — Other Ambulatory Visit: Payer: Self-pay | Admitting: Rheumatology

## 2022-12-28 DIAGNOSIS — M359 Systemic involvement of connective tissue, unspecified: Secondary | ICD-10-CM

## 2022-12-28 NOTE — Telephone Encounter (Signed)
Next Visit: 03/14/2023  Last Visit: 10/13/2022  Labs: 10/13/2022 CBC, CMP, ESR, and urine protein are negative. ds DNA,complements are normal   Eye exam: 05/11/2022 WNL    Current Dose per office note 10/13/2022: We will reduce hydroxychloroquine to 1 tablet p.o. daily.   DX: Autoimmune disease    Okay to refill Plaquenil?

## 2023-01-26 ENCOUNTER — Telehealth: Payer: Self-pay | Admitting: *Deleted

## 2023-01-26 NOTE — Telephone Encounter (Signed)
Labs received from:[facility]   Drawn on:01/24/2023  Reviewed by: Hazel Sams, PA-C  Labs drawn:CBC, CMP, Vitamin D  Results:Sodium 145 all other results WNL.  Patient is on MTX 5 tabs po weekly, PLQ 200 mg po daily and Folic Acid 1 mg po daily.

## 2023-02-06 ENCOUNTER — Other Ambulatory Visit: Payer: Self-pay | Admitting: Neurology

## 2023-02-06 DIAGNOSIS — G4719 Other hypersomnia: Secondary | ICD-10-CM

## 2023-02-07 ENCOUNTER — Telehealth: Payer: Self-pay | Admitting: Neurology

## 2023-02-07 NOTE — Telephone Encounter (Signed)
LVM and sent mychart msg informing pt of need to reschedule 03/23/23 appointment - NP out

## 2023-02-18 ENCOUNTER — Other Ambulatory Visit: Payer: Self-pay | Admitting: Rheumatology

## 2023-02-20 NOTE — Telephone Encounter (Signed)
Next Visit: 03/14/2023  Last Visit: 10/13/2022  Last Fill: 10/13/2022  DX: Autoimmune disease   Current Dose per office note 10/13/2022: Methotrexate 5 tablets by mouth once a week   Labs: 01/24/2023 Sodium 145 all other results WNL   Okay to refill MTX?

## 2023-02-28 NOTE — Progress Notes (Unsigned)
Office Visit Note  Patient: Misty Curtis             Date of Birth: 1961-11-18           MRN: [id number]             PCP: Philmore Pali, NP (Inactive) Referring: No ref. provider found Visit Date: 03/14/2023 Occupation: @GUAROCC @  Subjective:  Medication monitoring   History of Present Illness: Misty Curtis is a 62 y.o. female with history of autoimmune disease.  Patient is currently taking Plaquenil 200 mg 1 tablet by mouth daily, Methotrexate 5 tablets by mouth once a week, and folic acid 1 mg daily.  She is tolerating combination therapy without any side effects.  She denies missing any doses recently.  She continues to experience intermittent bouts of fatigue.  She has interrupted sleep at night at times due to trochanter bursitis of the left hip.  She is a side sleeper which exacerbates her symptoms.  She uses Voltaren gel topically as needed for pain relief.  She continues to have intermittent pain and stiffness in both hands.  She has ongoing sicca symptoms and uses over-the-counter products for symptomatic relief.  She continues to see the dentist every 6 months.  She has occasional sores in her mouth but has not used any Magic mouthwash recently.  She has photosensitivity and tries to avoid direct sun exposure and wear sunscreen as she plans on being outdoors.  She denies any recent malar rash.  Overall her symptoms have been stable on the current treatment regimen.    Activities of Daily Living:  Patient reports morning stiffness for 30 minutes.   Patient Reports nocturnal pain.  Difficulty dressing/grooming: Denies Difficulty climbing stairs: Denies Difficulty getting out of chair: Denies Difficulty using hands for taps, buttons, cutlery, and/or writing: Reports  Review of Systems  Constitutional:  Negative for fatigue.  HENT:  Positive for mouth dryness. Negative for mouth sores.   Eyes:  Positive for dryness.  Respiratory:  Negative for shortness of breath.   Cardiovascular:   Negative for chest pain and palpitations.  Gastrointestinal:  Positive for diarrhea. Negative for blood in stool and constipation.  Endocrine: Negative for increased urination.  Genitourinary:  Positive for involuntary urination.  Musculoskeletal:  Positive for joint pain, gait problem, joint pain, joint swelling, myalgias, morning stiffness and myalgias. Negative for muscle weakness and muscle tenderness.  Skin:  Positive for hair loss and sensitivity to sunlight. Negative for color change and rash.  Allergic/Immunologic: Positive for susceptible to infections.  Neurological:  Negative for dizziness and headaches.  Hematological:  Negative for swollen glands.  Psychiatric/Behavioral:  Positive for sleep disturbance. Negative for depressed mood. The patient is not nervous/anxious.     PMFS History:  Patient Active Problem List   Diagnosis Date Noted   Heel pain, chronic, left 09/30/2020   Achilles tendinitis, left leg 08/19/2020   Heel pain, chronic, right 05/02/2019   Thrush 06/11/2018   Acquired hypothyroidism 12/08/2016   Autoimmune disease ([place]) 12/06/2016   Other fatigue 12/06/2016   Raynaud's disease without gangrene 12/06/2016   High risk medication use 12/06/2016   Allergic asthma, mild intermittent, uncomplicated [id number]   History of hypertension 12/06/2016   Excessive daytime sleepiness 07/26/2016   Snoring 07/26/2016   Laryngitis 11/10/2012   Postnasal drip 05/02/2012   Acute sinusitis, unspecified 10/24/2011   HYPERTENSION 12/14/2009   BRONCHITIS 12/14/2009   LUPUS 12/14/2009   History of melanoma 12/14/2009   Seasonal and perennial allergic  rhinitis 12/14/2009    Past Medical History:  Diagnosis Date   Allergic rhinitis    skin test 01-18-10   Bronchitis    Lupus (systemic lupus erythematosus) (HCC)    joints and skin rash. Dr. Estanislado Pandy   Melanoma [facility])    Rhinosinusitis    Skin cancer     Family History  Problem Relation Age of Onset   Stroke Mother     Allergies Father    Heart disease Father        bacterial endocarditis   Hypothyroidism Sister    Hypothyroidism Sister    Hashimoto's thyroiditis Sister    Cancer Maternal Grandmother    Hepatitis Son    Past Surgical History:  Procedure Laterality Date   ABDOMINAL HYSTERECTOMY     ACHILLES TENDON REPAIR Bilateral    Dr. Durward Fortes    BREAST LUMPECTOMY Left    08/21/2019, 09/20/2019   KNEE ARTHROPLASTY     skin cancer extraction     skin cancer extraction  2023   right side of face   Social History   Social History Narrative   Not on file   Immunization History  Administered Date(s) Administered   Covid-19, Mrna,Vaccine(Spikevax)97yrs and older 12/01/2022   Influenza Split 09/19/2011, 09/12/2012, 09/02/2013   Influenza-Unspecified 09/19/2011, 09/12/2012, 09/02/2013, 10/19/2014   Moderna Sars-Covid-2 Vaccination 02/26/2020, 03/25/2020, 12/09/2020   Pneumococcal Polysaccharide-23 08/19/2010   Pneumococcal-Unspecified 07/01/2020     Objective: Vital Signs: BP (!) 149/79 (BP Location: Left Arm, Patient Position: Sitting, Cuff Size: Normal)   Pulse 66   Resp 16   Ht 5\' 3"  (1.6 m)   Wt 197 lb (89.4 kg)   BMI 34.90 kg/m    Physical Exam Vitals and nursing note reviewed.  Constitutional:      Appearance: She is well-developed.  HENT:     Head: Normocephalic and atraumatic.  Eyes:     Conjunctiva/sclera: Conjunctivae normal.  Cardiovascular:     Rate and Rhythm: Normal rate and regular rhythm.     Heart sounds: Normal heart sounds.  Pulmonary:     Effort: Pulmonary effort is normal.     Breath sounds: Normal breath sounds.  Abdominal:     General: Bowel sounds are normal.     Palpations: Abdomen is soft.  Musculoskeletal:     Cervical back: Normal range of motion.  Lymphadenopathy:     Cervical: No cervical adenopathy.  Skin:    General: Skin is warm and dry.     Capillary Refill: Capillary refill takes less than 2 seconds.  Neurological:     Mental  Status: She is alert and oriented to person, place, and time.  Psychiatric:        Behavior: Behavior normal.      Musculoskeletal Exam: C-spine has good range of motion.  Postural thoracic kyphosis.  Shoulder joints, elbow joints, wrist joints, MCPs, PIPs, DIPs have good range of motion with no synovitis.  PIP and DIP thickening.  Complete fist formation bilaterally.  Hip joints have good range of motion with no groin pain.  Tenderness over the left trochanteric bursa.  Knee joints have good range of motion with no warmth or effusion.  Ankle joints have good range of motion with no joint tenderness.  Some pedal edema noted bilaterally.  No tenderness or synovitis over MTP joints.  CDAI Exam: CDAI Score: -- Patient Global: --; Provider Global: -- Swollen: --; Tender: -- Joint Exam 03/14/2023   No joint exam has been documented for this visit  There is currently no information documented on the homunculus. Go to the Rheumatology activity and complete the homunculus joint exam.  Investigation: No additional findings.  Imaging: No results found.  Recent Labs: Lab Results  Component Value Date   WBC 8.3 10/13/2022   HGB 15.4 10/13/2022   PLT 313 10/13/2022   NA 141 10/13/2022   K 3.8 10/13/2022   CL 102 10/13/2022   CO2 31 10/13/2022   GLUCOSE 96 10/13/2022   BUN 16 10/13/2022   CREATININE 0.98 10/13/2022   BILITOT 0.4 10/13/2022   ALKPHOS 82 06/02/2017   AST 30 10/13/2022   ALT 18 10/13/2022   PROT 7.0 10/13/2022   ALBUMIN 4.1 06/02/2017   CALCIUM 9.9 10/13/2022   GFRAA 70 04/12/2021    Speciality Comments: PLQ eye exam: 05/11/2022 WNL at Dr Myna Hidalgo. Hulan Saas, OD Follow up in 1 year   Procedures:  No procedures performed Allergies: Latex, Capsicum annuum extract & derivative (bell pepper) [capsicum], Cat hair extract, Lactose intolerance (gi), Milk-related compounds, Neomycin-bacitracin zn-polymyx, Onion, Other, Strawberry extract, and Wixela inhub  [fluticasone-salmeterol]       Assessment / Plan:     Visit Diagnoses: Autoimmune disease ([place]) - History of positive ANA, DS DNA, Raynauds, fatigue, oral ulcers, nasal ulcers, arthralgias, sicca:  She has not exhibiting any signs or symptoms of an autoimmune disease flare at this time.  She has clinically been doing well taking Plaquenil 200 mg 1 tablet by mouth daily and methotrexate 5 tablets by mouth once weekly.  Overall her symptoms have been stable on the current treatment regimen.  No malar rash noted.  She has occasional oral ulcers but no nasal ulcers.  No parotid swelling or tenderness.  She has ongoing sicca symptoms and uses OTC products for symptomatic relief.  She continues to see the dentist every 6 months and the ophthalmology yearly. No cervical lymphadenopathy noted.  Discussed the importance of avoid direct sun exposure.   She has no synovitis on examination today.  Energy level has been stable.  Lab work from 10/13/22 was reviewed today in the office: dsDNA negative, complements WNL, ESR WNL, no proteinuria.  The following lab work will be updated today.   She will remain on combination therapy as prescribed.  She was advised to notify us if she develops any signs or symptoms of a flare.  She will follow-up in the office in 5 months or sooner if needed.  - Plan: Protein / creatinine ratio, urine, Anti-DNA antibody, double-stranded, C3 and C4, Sedimentation rate, ANA, CBC with Differential/Platelet, COMPLETE METABOLIC PANEL WITH GFR  High risk medication use - Plaquenil 200 mg 1 tablet by mouth daily, Methotrexate 5 tablets by mouth once a week, and folic acid 1 mg daily.  CBC and CMP updated on 01/23/23.  Orders for CBC and CMP were released today. PLQ eye exam: 05/11/2022 WNL at Dr Myna Hidalgo. Hulan Saas, OD Follow up in 1 year.  Patient was given a Plaquenil eye examination form to take with her to her upcoming appointment. - Plan: CBC with Differential/Platelet, COMPLETE METABOLIC PANEL  WITH GFR  Raynaud's disease without gangrene: Not currently active.   Right carpal tunnel syndrome: Asymptomatic currently.    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  Other medical conditions are listed as follows:   History of fatigue  History of hypertension: BP was elevated today in the office.  Blood  pressure was rechecked prior to the patient leaving.  Advised patient to monitor BP closely.   History of melanoma  History of asthma  History of hypothyroidism  History of sleep apnea  Orders: Orders Placed This Encounter  Procedures   Protein / creatinine ratio, urine   Anti-DNA antibody, double-stranded   C3 and C4   Sedimentation rate   ANA   CBC with Differential/Platelet   COMPLETE METABOLIC PANEL WITH GFR   No orders of the defined types were placed in this encounter.   Follow-Up Instructions: Return in about 5 months (around 08/14/2023) for Autoimmune Disease.   Ofilia Neas, PA-C  Note - This record has been created using Dragon software.  Chart creation errors have been sought, but may not always  have been located. Such creation errors do not reflect on  the standard of medical care.

## 2023-03-14 ENCOUNTER — Encounter: Payer: Self-pay | Admitting: Physician Assistant

## 2023-03-14 ENCOUNTER — Ambulatory Visit: Payer: BC Managed Care – PPO | Attending: Physician Assistant | Admitting: Physician Assistant

## 2023-03-14 VITALS — BP 149/79 | HR 66 | Resp 16 | Ht 63.0 in | Wt 197.0 lb

## 2023-03-14 DIAGNOSIS — Z87898 Personal history of other specified conditions: Secondary | ICD-10-CM

## 2023-03-14 DIAGNOSIS — I7300 Raynaud's syndrome without gangrene: Secondary | ICD-10-CM | POA: Diagnosis not present

## 2023-03-14 DIAGNOSIS — Z79899 Other long term (current) drug therapy: Secondary | ICD-10-CM

## 2023-03-14 DIAGNOSIS — G5601 Carpal tunnel syndrome, right upper limb: Secondary | ICD-10-CM | POA: Diagnosis not present

## 2023-03-14 DIAGNOSIS — M6788 Other specified disorders of synovium and tendon, other site: Secondary | ICD-10-CM

## 2023-03-14 DIAGNOSIS — Z8669 Personal history of other diseases of the nervous system and sense organs: Secondary | ICD-10-CM

## 2023-03-14 DIAGNOSIS — Z8679 Personal history of other diseases of the circulatory system: Secondary | ICD-10-CM

## 2023-03-14 DIAGNOSIS — Z8639 Personal history of other endocrine, nutritional and metabolic disease: Secondary | ICD-10-CM

## 2023-03-14 DIAGNOSIS — Z8709 Personal history of other diseases of the respiratory system: Secondary | ICD-10-CM

## 2023-03-14 DIAGNOSIS — M359 Systemic involvement of connective tissue, unspecified: Secondary | ICD-10-CM | POA: Diagnosis not present

## 2023-03-14 DIAGNOSIS — Z85820 Personal history of malignant melanoma of skin: Secondary | ICD-10-CM

## 2023-03-14 NOTE — Patient Instructions (Signed)
Standing Labs We placed an order today for your standing lab work.   Please have your standing labs drawn in June and every 3 months   Please have your labs drawn 2 weeks prior to your appointment so that the provider can discuss your lab results at your appointment, if possible.  Please note that you may see your imaging and lab results in MyChart before we have reviewed them. We will contact you once all results are reviewed. Please allow our office up to 72 hours to thoroughly review all of the results before contacting the office for clarification of your results.  WALK-IN LAB HOURS  Monday through Thursday from 8:00 am -12:30 pm and 1:00 pm-5:00 pm and Friday from 8:00 am-12:00 pm.  Patients with office visits requiring labs will be seen before walk-in labs.  You may encounter longer than normal wait times. Please allow additional time. Wait times may be shorter on  Monday and Thursday afternoons.  We do not book appointments for walk-in labs. We appreciate your patience and understanding with our staff.   Labs are drawn by [facility]. Please bring your co-pay at the time of your lab draw.  You may receive a bill from [facility] for your lab work.  Please note if you are on Hydroxychloroquine and and an order has been placed for a Hydroxychloroquine level,  you will need to have it drawn 4 hours or more after your last dose.  If you wish to have your labs drawn at another location, please call the office 24 hours in advance so we can fax the orders.  The office is located at [address], [address], [place], [place] [address]   If you have any questions regarding directions or hours of operation,  please call [phone].   As a reminder, please drink plenty of water prior to coming for your lab work. Thanks!  

## 2023-03-14 NOTE — Progress Notes (Signed)
CBC WNL

## 2023-03-15 NOTE — Progress Notes (Signed)
AST is borderline elevated. Rest of CMP WNL.  No proteinuria.  ESR and complements WNL

## 2023-03-16 LAB — COMPLETE METABOLIC PANEL WITH GFR
AG Ratio: 1.6 (calc) (ref 1.0–2.5)
ALT: 26 U/L (ref 6–29)
AST: 37 U/L — ABNORMAL HIGH (ref 10–35)
Albumin: 4.2 g/dL (ref 3.6–5.1)
Alkaline phosphatase (APISO): 93 U/L (ref 37–153)
BUN: 16 mg/dL (ref 7–25)
CO2: 31 mmol/L (ref 20–32)
Calcium: 9.9 mg/dL (ref 8.6–10.4)
Chloride: 103 mmol/L (ref 98–110)
Creat: 0.91 mg/dL (ref 0.50–1.05)
Globulin: 2.7 g/dL (calc) (ref 1.9–3.7)
Glucose, Bld: 86 mg/dL (ref 65–99)
Potassium: 4.2 mmol/L (ref 3.5–5.3)
Sodium: 140 mmol/L (ref 135–146)
Total Bilirubin: 0.4 mg/dL (ref 0.2–1.2)
Total Protein: 6.9 g/dL (ref 6.1–8.1)
eGFR: 71 mL/min/{1.73_m2} (ref >=60)

## 2023-03-16 LAB — CBC WITH DIFFERENTIAL/PLATELET
Absolute Monocytes: 604 cells/uL (ref 200–950)
Basophils Absolute: 51 cells/uL (ref 0–200)
Basophils Relative: 0.6 %
Eosinophils Absolute: 221 cells/uL (ref 15–500)
Eosinophils Relative: 2.6 %
HCT: 44.5 % (ref 35.0–45.0)
Hemoglobin: 15.1 g/dL (ref 11.7–15.5)
Lymphs Abs: 2542 cells/uL (ref 850–3900)
MCH: 30.6 pg (ref 27.0–33.0)
MCHC: 33.9 g/dL (ref 32.0–36.0)
MCV: 90.3 fL (ref 80.0–100.0)
MPV: 9.6 fL (ref 7.5–12.5)
Monocytes Relative: 7.1 %
Neutro Abs: 5083 cells/uL (ref 1500–7800)
Neutrophils Relative %: 59.8 %
Platelets: 312 10*3/uL (ref 140–400)
RBC: 4.93 10*6/uL (ref 3.80–5.10)
RDW: 13.9 % (ref 11.0–15.0)
Total Lymphocyte: 29.9 %
WBC: 8.5 10*3/uL (ref 3.8–10.8)

## 2023-03-16 LAB — C3 AND C4
C3 Complement: 148 mg/dL (ref 83–193)
C4 Complement: 31 mg/dL (ref 15–57)

## 2023-03-16 LAB — ANA: Anti Nuclear Antibody (ANA): POSITIVE — AB

## 2023-03-16 LAB — PROTEIN / CREATININE RATIO, URINE
Creatinine, Urine: 47 mg/dL (ref 20–275)
Protein/Creat Ratio: 64 mg/g creat (ref 24–184)
Protein/Creatinine Ratio: 0.064 mg/mg creat (ref 0.024–0.184)
Total Protein, Urine: 3 mg/dL — ABNORMAL LOW (ref 5–24)

## 2023-03-16 LAB — ANTI-DNA ANTIBODY, DOUBLE-STRANDED: ds DNA Ab: 1 IU/mL

## 2023-03-16 LAB — SEDIMENTATION RATE: Sed Rate: 6 mm/h (ref 0–30)

## 2023-03-16 LAB — ANTI-NUCLEAR AB-TITER (ANA TITER): ANA Titer 1: 1:40 {titer} — ABNORMAL HIGH

## 2023-03-16 NOTE — Progress Notes (Signed)
dsDNA is negative.  Labs are not consistent with a flare.

## 2023-03-16 NOTE — Progress Notes (Signed)
ANA remains positive-low titer.  No changes recommended at this time.

## 2023-03-23 ENCOUNTER — Ambulatory Visit: Payer: BC Managed Care – PPO | Admitting: Neurology

## 2023-03-26 ENCOUNTER — Other Ambulatory Visit: Payer: Self-pay | Admitting: Rheumatology

## 2023-03-26 ENCOUNTER — Other Ambulatory Visit: Payer: Self-pay | Admitting: Physician Assistant

## 2023-03-26 DIAGNOSIS — M359 Systemic involvement of connective tissue, unspecified: Secondary | ICD-10-CM

## 2023-03-27 NOTE — Telephone Encounter (Signed)
Last Fill: 03/10/2022  Next Visit: 08/17/2023  Last Visit: 03/14/2023  Dx: Autoimmune disease   Current Dose per office note on 03/14/2023: folic acid 1 mg daily   Okay to refill Folic Acid?

## 2023-03-27 NOTE — Telephone Encounter (Signed)
Last Fill: 12/28/2022   Eye exam: 05/11/2022 WNL    Labs: 03/14/2023 CBC WNL AST is borderline elevated. Rest of CMP WNL.   Next Visit: 08/17/2023  Last Visit: 03/14/2023  DX: Autoimmune disease   Current Dose per office note 03/14/2023:  Plaquenil 200 mg 1 tablet by mouth daily   Okay to refill Plaquenil?

## 2023-04-04 ENCOUNTER — Other Ambulatory Visit: Payer: Self-pay | Admitting: *Deleted

## 2023-04-04 MED ORDER — ONDANSETRON HCL 4 MG PO TABS: ORAL_TABLET | ORAL | 0 refills | Status: DC

## 2023-04-04 NOTE — Telephone Encounter (Signed)
Refill request received via fax from [facility] for Zofran.  Last Fill: 08/26/2022  Next Visit: 08/17/2023  Last Visit: 03/14/2023  Dx: Autoimmune disease   Current Dose per office note on 03/14/2023: Autoimmune disease   Okay to refill Zofran?

## 2023-04-18 ENCOUNTER — Ambulatory Visit: Payer: BC Managed Care – PPO | Admitting: Neurology

## 2023-04-18 ENCOUNTER — Encounter: Payer: Self-pay | Admitting: Neurology

## 2023-04-18 VITALS — BP 159/81 | HR 69 | Ht 63.0 in | Wt 197.0 lb

## 2023-04-18 DIAGNOSIS — G4710 Hypersomnia, unspecified: Secondary | ICD-10-CM

## 2023-04-18 DIAGNOSIS — G4719 Other hypersomnia: Secondary | ICD-10-CM

## 2023-04-18 NOTE — Progress Notes (Signed)
PATIENT: Misty Curtis DOB: 27-Aug-1961  REASON FOR VISIT: follow up for hypersomnia  HISTORY FROM: patient PRIMARY NEUROLOGIST: Dr. Vickey Huger   HISTORY OF PRESENT ILLNESS: Today 04/18/23  Is now retired. Goes once a month to [place] to care for her uncle. On Provigil 150 mg in AM. Has noticed that when she takes it, her BP is higher. She really only takes 100 mg when she takes it. Still think she needs it, does substitute occasionally. Takes around 7 AM, usually keeps her awake. The Provigil keeps the long dozing spells away. If she is focused, she will not fall asleep. ESS 12. She drinks 3 glasses of sweet tea.  Update 03/22/22 SS: Misty Curtis here today for follow-up. Taking Provigil 150 mg in AM at 8 AM. Keeps her awake and active, and she still can sleep. Is retired fully now. Substitutes when she wants, keeps her grandchildren. Had lupus flare back in January, still on Methotrexate and Plaquenil. Mother passed away in 20-Aug-2023. She is assisting her elderly uncle in his affairs, goes to [place] once a month. BP up today, took her medication, keeps check on it at home, runs in 120's.  Update 02/19/21 SS: Misty Curtis is a 62 year old female with history of idiopathic hypersomnia.  Remains on Provigil 150 mg daily.  200 mg was too much, 100 mg was not enough.  The 150 mg dosage seems to work well.  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  Had a rough couple months with her lupus.  BP elevated today, checks at home, usually 140/80.  Recently had left Achilles reconstructive surgery.  Here today for evaluation unaccompanied. FSS was 32.   Update February 26, 2020 SS: Misty Curtis is a 62 year old female with history of idiopathic hypersomnia.  She remains on Provigil 200 mg daily, however she is only taking half tablet.  When she takes the 200 mg, she says she will stay up till midnight.  When taking 100 mg, around 630, she will lay on the  couch, fall asleep. She seems to do well during the day, up until 6:30 pm. 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  In the last several months, she has had Achilles tendon reconstruction, and 2 breast lumpectomies.  In the last year, she has gained about 10 pounds.  She is not sure if her increased drowsiness, is related to deconditioning or weight gain?  She would like to try dose modification.  She presents today for evaluation unaccompanied. ESS was 12.  HISTORY 02/20/2019 SS: Misty Curtis is a 62 year old female with a history of idiopathic hypersomnia.  She is currently taking Provigil 200 mg daily however she is only taking half tablet.  She reports she is tolerating the medication well.  She will take it around 6:30 AM and she reports benefit until about 9:30 PM whenever she goes to bed.  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  She reports she is able to do her job well without significant fatigue.  She returns today requesting a refill on her Provigil.  She presents today for evaluation unaccompanied.  She denies any new problems or concerns.  She is wearing a boot to her right foot that is healing from a prior injury.  REVIEW OF SYSTEMS: Out of a complete 14 system review of symptoms, the patient complains only of the following symptoms, and  all other reviewed systems are negative.  See HPI  ALLERGIES: Allergies  Allergen Reactions   Latex Other (See Comments) and Rash   Molds & Smuts Rash and Shortness Of Breath    Other Reaction(s): Bronchospasm, Cough, Respiratory Distress   Capsicum Annuum Extract & Derivative (Bell Pepper) [Capsicum] Swelling    Green peppers   Cat Hair Extract     Other Reaction(s): Other (See Comments)   Lactose Intolerance (Gi) Diarrhea   Milk (Cow) Hives and Itching    Other Reaction(s): Diarrhea, GI Intolerance   Milk-Related Compounds Diarrhea   Modafinil Hypertension   Neomycin-Bacitracin Zn-Polymyx     REACTION: rash   Other      Pollen and mold.    Strawberry Extract    Sulfa Antibiotics     Other Reaction(s): GI Intolerance   Wixela Inhub [Fluticasone-Salmeterol]     Per patient - caused throat to close and laryngitis   Bacitracin-Polymyxin B Rash   Onion Hives, Itching, Rash and Swelling    Also allergic to peppers and hot peppers and dairy  Other Reaction(s): Diarrhea, GI Intolerance  Also allergic to peppers and hot peppers, , Also allergic to peppers and hot peppers and dairy   Pollen Extract Swelling and Rash    Other Reaction(s): Bronchospasm, Respiratory Distress    HOME MEDICATIONS: Outpatient Medications Prior to Visit  Medication Sig Dispense Refill   albuterol (VENTOLIN HFA) 108 (90 Base) MCG/ACT inhaler Inhale 2 puffs into the lungs every 6 (six) hours as needed for wheezing or shortness of breath. 1 each 12   Ascorbic Acid (VITAMIN C) 500 MG tablet Take 500 mg by mouth daily.     B Complex Vitamins (VITAMIN B COMPLEX PO) Take by mouth.     BIOTENE DRY MOUTH (BIOTENE) LIQD Place 1 application onto teeth as needed.     budesonide-formoterol (SYMBICORT) 80-4.5 MCG/ACT inhaler INHALE 2 PUFFS INTO THE LUNGS IN THE MORNING AND AT BEDTIME 10.2 g 5   Cholecalciferol (VITAMIN D) 2000 units CAPS Take by mouth.     clotrimazole-betamethasone (LOTRISONE) cream APPLY TOPICALLY TO THE AFFECTED AREA TWICE DAILY 30 g prn   diclofenac Sodium (VOLTAREN) 1 % GEL Apply 2-4 grams to affected joint 4 times daily as needed. 400 g 2   Digestive Enzymes (DIGESTIVE ENZYME PO) Take by mouth.     fluticasone (FLONASE) 50 MCG/ACT nasal spray SHAKE LIQUID AND USE 2 SPRAYS IN EACH NOSTRIL DAILY 16 g 12   folic acid (FOLVITE) 1 MG tablet TAKE 1 TABLET(1 MG) BY MOUTH DAILY 90 tablet 3   hydroxychloroquine (PLAQUENIL) 200 MG tablet TAKE 1 TABLET(200 MG) BY MOUTH DAILY 90 tablet 0   lactase (LACTAID) 3000 UNITS tablet Take by mouth as needed.      lisinopril (ZESTRIL) 20 MG tablet Take 10 mg by mouth every morning.      loratadine (CLARITIN) 10 MG tablet Take 10 mg by mouth daily.     methotrexate (RHEUMATREX) 2.5 MG tablet TAKE 5 TABLETS BY MOUTH ONCE WEEKLY 60 tablet 0   MISC NATURAL PRODUCTS PO Take by mouth. Flex Guard     modafinil (PROVIGIL) 100 MG tablet TAKE 1 AND 1/2 TABLETS(150 MG) BY MOUTH DAILY 45 tablet 5   montelukast (SINGULAIR) 10 MG tablet TAKE 1 TABLET BY MOUTH EVERY DAY 30 tablet 6   mupirocin ointment (BACTROBAN) 2 % APPLY EXTERNALLY TO THE AFFECTED AREA TWICE DAILY AS DIRECTED 22 g 12   NON FORMULARY Doterra Essential Oils:  Eucalyptus, [place],  Lemongrass, Frankincense, [place], On Guard     ondansetron (ZOFRAN) 4 MG tablet TAKE 1 TABLET(4 MG) BY MOUTH EVERY 8 HOURS AS NEEDED FOR NAUSEA OR VOMITING 30 tablet 0   phenylephrine (SUDAFED PE) 10 MG TABS tablet Take 10 mg by mouth every 4 (four) hours as needed.     Specialty Vitamins Products (CENTRUM PERFORMANCE) TABS Take 1 tablet by mouth daily.     SYNTHROID 75 MCG tablet Take 75 mcg by mouth every morning.     B Complex-Folic Acid (B COMPLEX VITAMINS, W/ FA,) CAPS Take 1 tablet by mouth daily. (Patient not taking: Reported on 03/14/2023)     methotrexate (RHEUMATREX) 2.5 MG tablet TAKE 5 TABLETS BY MOUTH EVERY WEEK 60 tablet 0   modafinil (PROVIGIL) 100 MG tablet TAKE 1 AND 1/2 TABLETS(150 MG) BY MOUTH DAILY 45 tablet 3   No facility-administered medications prior to visit.    PAST MEDICAL HISTORY: Past Medical History:  Diagnosis Date   Allergic rhinitis    skin test 01-18-10   Bronchitis    Lupus (systemic lupus erythematosus) (HCC)    joints and skin rash. Dr. Corliss Skains   Melanoma [facility])    Rhinosinusitis    Skin cancer     PAST SURGICAL HISTORY: Past Surgical History:  Procedure Laterality Date   ABDOMINAL HYSTERECTOMY     ACHILLES TENDON REPAIR Bilateral    Dr. Cleophas Dunker    BREAST LUMPECTOMY Left    08/21/2019, 09/20/2019   KNEE ARTHROPLASTY     skin cancer extraction     skin cancer extraction  2023   right side  of face    FAMILY HISTORY: Family History  Problem Relation Age of Onset   Stroke Mother    Allergies Father    Heart disease Father        bacterial endocarditis   Hypothyroidism Sister    Hypothyroidism Sister    Hashimoto's thyroiditis Sister    Cancer Maternal Grandmother    Hepatitis Son     SOCIAL HISTORY: Social History   Socioeconomic History   Marital status: Married    Spouse name: Not on file   Number of children: 3   Years of education: Not on file   Highest education level: Not on file  Occupational History   Occupation: special ed Runner, broadcasting/film/video [place]  Tobacco Use   Smoking status: Never    Passive exposure: Past   Smokeless tobacco: Never  Vaping Use   Vaping Use: Never used  Substance and Sexual Activity   Alcohol use: Yes    Alcohol/week: 1.0 standard drink of alcohol    Types: 1 Standard drinks or equivalent per week    Comment: 1 MONTHLY   Drug use: No   Sexual activity: Yes    Birth control/protection: None  Other Topics Concern   Not on file  Social History Narrative   Not on file   Social Determinants of Health   Financial Resource Strain: Not on file  Food Insecurity: Not on file  Transportation Needs: Not on file  Physical Activity: Not on file  Stress: Not on file  Social Connections: Not on file  Intimate Partner Violence: Not on file   PHYSICAL EXAM  Vitals:   04/18/23 1032  BP: (!) 159/81  Pulse: 69  Weight: 197 lb (89.4 kg)  Height: 5\' 3"  (1.6 m)   Body mass index is 34.9 kg/m.  Generalized: Well developed, in no acute distress  Neurological examination  Mentation: Alert oriented  to time, place, history taking. Follows all commands speech and language fluent Cranial nerve II-XII: Pupils were equal round reactive to light. Extraocular movements were full, visual field were full on confrontational test. Facial sensation and strength were normal. Head turning and shoulder shrug were normal and symmetric. Motor: The  motor testing reveals 5 over 5 strength of all 4 extremities. Good symmetric motor tone is noted throughout.  Sensory: Sensory testing is intact to soft touch on all 4 extremities. No evidence of extinction is noted.  Coordination: Cerebellar testing reveals good finger-nose-finger and heel-to-shin bilaterally.  Gait and station: Gait is normal.  Reflexes: Deep tendon reflexes are symmetric and normal bilaterally.   DIAGNOSTIC DATA (LABS, IMAGING, TESTING) - I reviewed patient records, labs, notes, testing and imaging myself where available.  Lab Results  Component Value Date   WBC 8.5 03/14/2023   HGB 15.1 03/14/2023   HCT 44.5 03/14/2023   MCV 90.3 03/14/2023   PLT 312 03/14/2023      Component Value Date/Time   NA 140 03/14/2023 1014   K 4.2 03/14/2023 1014   CL 103 03/14/2023 1014   CO2 31 03/14/2023 1014   GLUCOSE 86 03/14/2023 1014   BUN 16 03/14/2023 1014   CREATININE 0.91 03/14/2023 1014   CALCIUM 9.9 03/14/2023 1014   PROT 6.9 03/14/2023 1014   ALBUMIN 4.1 06/02/2017 0945   AST 37 (H) 03/14/2023 1014   ALT 26 03/14/2023 1014   ALKPHOS 82 06/02/2017 0945   BILITOT 0.4 03/14/2023 1014   GFRNONAA 60 04/12/2021 1618   GFRAA 70 04/12/2021 1618   No results found for: "CHOL", "HDL", "LDLCALC", "LDLDIRECT", "TRIG", "CHOLHDL" No results found for: "HGBA1C" No results found for: "VITAMINB12" No results found for: "TSH"  ASSESSMENT AND PLAN 62 y.o. year old female  has a past medical history of Allergic rhinitis, Bronchitis, Lupus (systemic lupus erythematosus) (HCC), Melanoma (HCC), Rhinosinusitis, and Skin cancer. here with:  1.  Idiopathic hypersomnia   -Has noted an increase in her blood pressure over the last several months with Provigil, even at lower dose of 100 mg daily.  If she does not take the Provigil she experiences symptomatic hypersomnia. Today BP 159/81. -She seeks a replacement for Provigil in the setting of continued symptoms along with elevated blood  pressure.  May consider Sunosi 75 mg daily.  Another option may be to go back to armodafinil (her insurance stopped covering around 2017) -Will consult with Dr. Vickey Huger to ger her opinion about next steps, arrange for 6 month follow-up with Dr. Vickey Huger -Historically, she tried CPAP but offered no clear benefit to her daytime sleepiness -HLA narcolepsy testing was negative  Addendum 04/20/23 SS: I talked with Dr. Vickey Huger, will try Sunosi if we can get coverage. Felt other factors more likely to be affecting BP than strictly Provigil alone. Stop Provigil, switch to Sunosi 75 mg daily AM. I called the patient. Watch caffeine intake with BP.  Meds ordered this encounter  Medications   Solriamfetol HCl (SUNOSI) 75 MG TABS    Sig: Take 1 tablet (75 mg total) by mouth in the morning.    Dispense:  30 tablet    Refill:  5    Stop Provigil   Margie Ege, Isabela, DNP 04/18/2023, 10:49 AM [facility] [address] [place], [place] [address] [phone]

## 2023-04-20 MED ORDER — SUNOSI 75 MG PO TABS: 75.0000 mg | ORAL_TABLET | Freq: Every morning | ORAL | 5 refills | Status: DC

## 2023-04-20 NOTE — Addendum Note (Signed)
Addended by: Glean Salvo on: 04/20/2023 08:36 AM   Modules accepted: Orders

## 2023-05-04 ENCOUNTER — Telehealth: Payer: Self-pay

## 2023-05-04 ENCOUNTER — Other Ambulatory Visit (HOSPITAL_COMMUNITY): Payer: Self-pay

## 2023-05-04 NOTE — Telephone Encounter (Signed)
Pharmacy Patient Advocate Encounter   Received notification from [facility] that prior authorization for Sunosi 75MG  tablets  is required/requested.   PA submitted on 05/04/2023 to (ins) [employer] via [employer] or [facility]) confirmation # [id number] Status is pending

## 2023-05-05 ENCOUNTER — Other Ambulatory Visit (HOSPITAL_COMMUNITY): Payer: Self-pay

## 2023-05-05 NOTE — Telephone Encounter (Signed)
Pharmacy Patient Advocate Encounter  Received notification from [facility] [employer] that the request for prior authorization for Sunosi has been denied due to See below.      Please be advised we currently do not have a Pharmacist to review denials, therefore you will need to process appeals accordingly as needed. Thanks for your support at this time.   You may call [phone] or fax [phone], to appeal.  The denial letter has been scanned into the chart under the media tab.

## 2023-05-08 NOTE — Telephone Encounter (Signed)
Please advise insurance that [facility] also is a sleep clinic.

## 2023-05-09 ENCOUNTER — Other Ambulatory Visit: Payer: Self-pay | Admitting: Internal Medicine

## 2023-05-09 ENCOUNTER — Other Ambulatory Visit (HOSPITAL_COMMUNITY): Payer: Self-pay

## 2023-05-09 NOTE — Telephone Encounter (Signed)
Pharmacy Patient Advocate Encounter   Received notification from [facility] that prior authorization for Sunosi 75MG  tablets is required/requested.   PA submitted on 05/09/2023 to (ins) [employer] via [employer] or [facility]) confirmation #  [id number] Status is pending

## 2023-05-09 NOTE — Telephone Encounter (Signed)
PA denied, denial letter has been attached in patients documents.

## 2023-05-10 NOTE — Telephone Encounter (Signed)
We can appeal, but last office note does not mention sleep apnea, I see it in previous visit diagnoses but no where else

## 2023-05-10 NOTE — Telephone Encounter (Signed)
Insurance faxed over appeal paperwork-it has been placed into the chart.

## 2023-05-11 NOTE — Telephone Encounter (Signed)
Spoke with Baird Lyons, who recommended that Sunosi will not get covered unless patient has dx of narcolepsy or sleep apnea. She states next step is typically a stimulant, I did not have chart with me to see if she had tried and failed previously.

## 2023-05-18 ENCOUNTER — Other Ambulatory Visit: Payer: Self-pay | Admitting: Physician Assistant

## 2023-05-18 NOTE — Telephone Encounter (Signed)
Last Fill: 02/20/2023  Labs: 03/14/2023 AST is borderline elevated. Rest of CMP WNL. No proteinuria. ESR and complements WNL. dsDNA is negative.  Labs are not consistent with a flare. ANA remains positive-low titer.  No changes recommended at this time.   Next Visit: 08/17/2023  Last Visit: 03/14/2023  DX: Autoimmune disease   Current Dose per office note on 03/14/2023: Methotrexate 5 tablets by mouth once a week   Okay to refill Methotrexate?

## 2023-06-12 MED ORDER — ARMODAFINIL 150 MG PO TABS: 150.0000 mg | ORAL_TABLET | Freq: Every morning | ORAL | 5 refills | Status: DC

## 2023-06-12 NOTE — Telephone Encounter (Signed)
Meds ordered this encounter  Medications   Armodafinil 150 MG tablet    Sig: Take 1 tablet (150 mg total) by mouth in the morning.    Dispense:  30 tablet    Refill:  5    Cancel Provigil

## 2023-06-12 NOTE — Addendum Note (Signed)
Addended by: Glean Salvo on: 06/12/2023 09:35 AM   Modules accepted: Orders

## 2023-06-16 ENCOUNTER — Telehealth: Payer: Self-pay

## 2023-06-16 ENCOUNTER — Other Ambulatory Visit: Payer: Self-pay | Admitting: Physician Assistant

## 2023-06-16 ENCOUNTER — Other Ambulatory Visit (HOSPITAL_COMMUNITY): Payer: Self-pay

## 2023-06-16 DIAGNOSIS — M359 Systemic involvement of connective tissue, unspecified: Secondary | ICD-10-CM

## 2023-06-16 NOTE — Telephone Encounter (Signed)
Last Fill: 03/27/2023  Eye exam: 05/11/2022 WNL   Labs: 03/14/2023 CBC WNL  AST is borderline elevated. Rest of CMP WNL.   Next Visit: 08/17/2023  Last Visit: 03/14/2023  DX:Autoimmune disease   Current Dose per office note 03/14/2023: Plaquenil 200 mg 1 tablet by mouth daily   Patient is aware she is due to update her PLQ eye exam. Patient has an appointment scheduled for 06/26/2023  Okay to refill Plaquenil?

## 2023-06-16 NOTE — Telephone Encounter (Signed)
Pharmacy Patient Advocate Encounter   Received notification from [employer] that prior authorization for Armodafinil 150MG  tablets is required/requested.   PA submitted to [facility] via [employer]eds [employer] or (Medicaid) confirmation # [id number]  Status is pending

## 2023-06-17 ENCOUNTER — Other Ambulatory Visit: Payer: Self-pay | Admitting: Internal Medicine

## 2023-06-19 NOTE — Telephone Encounter (Signed)
RESUBMITTED TO PLAN USING KEY: Misty Curtis (Key: [id number])

## 2023-06-19 NOTE — Telephone Encounter (Signed)
Pharmacy Patient Advocate Encounter  Received notification from [employer] that the request for prior authorization for Armodafinil 150MG  tablets has been denied due to see below.      Please be advised we currently do not have a Pharmacist to review denials, therefore you will need to process appeals accordingly as needed. Thanks for your support at this time.  The Denial letter has been placed in the chart under the media tab.

## 2023-06-19 NOTE — Telephone Encounter (Signed)
ROUTING TO [facility] SLACK SO SHE KNOWS PA GOT APPROVED  Pa approved: Misty Curtis (KeySherilyn Dacosta) - [id number] Armodafinil 150MG  tablets Status: PA Response - ApprovedCreated: July 1st, 2024Sent: July 1st, 2024

## 2023-06-21 ENCOUNTER — Telehealth: Payer: Self-pay | Admitting: Internal Medicine

## 2023-06-21 NOTE — Telephone Encounter (Signed)
Patient states needs refill for Montelukast. Pharmacy is [facility] [address] [place]. Patient phone number is [phone].

## 2023-06-23 MED ORDER — MONTELUKAST SODIUM 10 MG PO TABS: 10.0000 mg | ORAL_TABLET | Freq: Every day | ORAL | 6 refills | Status: DC

## 2023-06-23 NOTE — Telephone Encounter (Signed)
Called and spoke with patient. Advised patient that her refill has been sent to her pharmacy. She verbalized understanding.   Nothing further needed.

## 2023-07-13 ENCOUNTER — Encounter: Payer: Self-pay | Admitting: Physician Assistant

## 2023-08-05 LAB — GENERAL HEALTH PANEL: EGFR: 73

## 2023-08-07 NOTE — Progress Notes (Addendum)
Office Visit Note  Patient: Misty Curtis             Date of Birth: 12-24-1960           MRN: [id number]             PCP: Ronal Fear, NP (Inactive) Referring: No ref. provider found Visit Date: 08/17/2023 Occupation: @GUAROCC @  Subjective:  Oral ulcers  History of Present Illness: Misty Curtis is a 62 y.o. female with autoimmune disease.  Patient states that recently she has been experiencing increased fatigue and oral ulcers.  She states that gradually oral ulcers are resolving.  She states oral ulcers improved after using a mouthwash.  She had been traveling recently.  She noticed a rash underneath her bra strap.  She states it has been itchy.  Raynaud's phenomenon is not active currently.  She continues to have some stiffness and discomfort in her hands.  She states she has been using sunscreen.    Activities of Daily Living:  Patient reports morning stiffness for 30 minutes.   Patient Reports nocturnal pain.  Difficulty dressing/grooming: Reports Difficulty climbing stairs: Reports Difficulty getting out of chair: Denies Difficulty using hands for taps, buttons, cutlery, and/or writing: Denies  Review of Systems  Constitutional:  Positive for fatigue.  HENT:  Positive for mouth sores and mouth dryness.   Eyes:  Positive for dryness.  Respiratory:  Negative for shortness of breath.   Cardiovascular:  Negative for chest pain and palpitations.  Gastrointestinal:  Negative for blood in stool, constipation and diarrhea.  Endocrine: Positive for increased urination.  Genitourinary:  Positive for involuntary urination.  Musculoskeletal:  Positive for joint pain, joint pain, joint swelling, muscle weakness and morning stiffness. Negative for gait problem, myalgias, muscle tenderness and myalgias.  Skin:  Positive for color change, rash, hair loss and sensitivity to sunlight.  Allergic/Immunologic: Negative for susceptible to infections.  Neurological:  Negative for dizziness and  headaches.  Hematological:  Negative for swollen glands.  Psychiatric/Behavioral:  Positive for sleep disturbance. Negative for depressed mood. The patient is not nervous/anxious.     PMFS History:  Patient Active Problem List   Diagnosis Date Noted   Hypersomnia 04/18/2023   Heel pain, chronic, left 09/30/2020   Achilles tendinitis, left leg 08/19/2020   Heel pain, chronic, right 05/02/2019   Thrush 06/11/2018   Acquired hypothyroidism 12/08/2016   Autoimmune disease (HCC) 12/06/2016   Other fatigue 12/06/2016   Raynaud's disease without gangrene 12/06/2016   High risk medication use 12/06/2016   Allergic asthma, mild intermittent, uncomplicated 12/06/2016   History of hypertension 12/06/2016   Excessive daytime sleepiness 07/26/2016   Snoring 07/26/2016   Laryngitis 11/10/2012   Postnasal drip 05/02/2012   Acute sinusitis, unspecified 10/24/2011   HYPERTENSION 12/14/2009   BRONCHITIS 12/14/2009   LUPUS 12/14/2009   History of melanoma 12/14/2009   Seasonal and perennial allergic rhinitis 12/14/2009    Past Medical History:  Diagnosis Date   Allergic rhinitis    skin test 01-18-10   Bronchitis    Cataract    left eye, per patient   Lupus (systemic lupus erythematosus) (HCC)    joints and skin rash. Dr. Corliss Skains   Melanoma [facility])    Rhinosinusitis    Skin cancer     Family History  Problem Relation Age of Onset   Stroke Mother    Allergies Father    Heart disease Father        bacterial endocarditis   Hypothyroidism Sister  Hypothyroidism Sister    Hashimoto's thyroiditis Sister    Cancer Maternal Grandmother    Hepatitis Son    Past Surgical History:  Procedure Laterality Date   ABDOMINAL HYSTERECTOMY     ACHILLES TENDON REPAIR Bilateral    Dr. Cleophas Dunker    BREAST LUMPECTOMY Left    08/21/2019, 09/20/2019   KNEE ARTHROPLASTY     skin cancer extraction     skin cancer extraction  2023   right side of face   Social History   Social History Narrative    Not on file   Immunization History  Administered Date(s) Administered   Covid-19, Mrna,Vaccine(Spikevax)59yrs and older 12/01/2022   Influenza Split 09/19/2011, 09/12/2012, 09/02/2013   Influenza-Unspecified 09/19/2011, 09/12/2012, 09/02/2013, 10/19/2014   Moderna Sars-Covid-2 Vaccination 02/26/2020, 03/25/2020, 12/09/2020   Pneumococcal Polysaccharide-23 08/19/2010   Pneumococcal-Unspecified 07/01/2020     Objective: Vital Signs: BP (!) 155/88 (BP Location: Left Arm, Patient Position: Sitting, Cuff Size: Normal)   Pulse 80   Resp 16   Ht 5\' 3"  (1.6 m)   Wt 198 lb (89.8 kg)   BMI 35.07 kg/m    Physical Exam Vitals and nursing note reviewed.  Constitutional:      Appearance: She is well-developed.  HENT:     Head: Normocephalic and atraumatic.  Eyes:     Conjunctiva/sclera: Conjunctivae normal.  Cardiovascular:     Rate and Rhythm: Normal rate and regular rhythm.     Heart sounds: Normal heart sounds.  Pulmonary:     Effort: Pulmonary effort is normal.     Breath sounds: Normal breath sounds.  Abdominal:     General: Bowel sounds are normal.     Palpations: Abdomen is soft.  Musculoskeletal:     Cervical back: Normal range of motion.  Lymphadenopathy:     Cervical: No cervical adenopathy.  Skin:    General: Skin is warm and dry.     Capillary Refill: Capillary refill takes less than 2 seconds.  Neurological:     Mental Status: She is alert and oriented to person, place, and time.  Psychiatric:        Behavior: Behavior normal.      Musculoskeletal Exam: Cervical spine was in good range of motion.  She had thoracic kyphosis.  She had no tenderness over thoracic or lumbar spine.  Shoulders, elbows, wrist joints, MCPs PIPs and DIPs with good range of motion.  She had bilateral PIP and DIP thickening with no synovitis.  Hip joints and knee joints with good range of motion.  No warmth swelling or effusion was noted.  There was no tenderness over ankles or  MTPs.  CDAI Exam: CDAI Score: -- Patient Global: --; Provider Global: -- Swollen: --; Tender: -- Joint Exam 08/17/2023   No joint exam has been documented for this visit   There is currently no information documented on the homunculus. Go to the Rheumatology activity and complete the homunculus joint exam.  Investigation: No additional findings.  Imaging: No results found.  Recent Labs: Lab Results  Component Value Date   WBC 8.5 03/14/2023   HGB 15.1 03/14/2023   PLT 312 03/14/2023   NA 140 03/14/2023   K 4.2 03/14/2023   CL 103 03/14/2023   CO2 31 03/14/2023   GLUCOSE 86 03/14/2023   BUN 16 03/14/2023   CREATININE 0.91 03/14/2023   BILITOT 0.4 03/14/2023   ALKPHOS 82 06/02/2017   AST 37 (H) 03/14/2023   ALT 26 03/14/2023   PROT 6.9 03/14/2023  ALBUMIN 4.1 06/02/2017   CALCIUM 9.9 03/14/2023   GFRAA 70 04/12/2021     Speciality Comments: PLQ eye exam: 06/26/2023 WNL at Dr Venita Lick. Lawernce Ion, OD Follow up in 1 year  Procedures:  No procedures performed Allergies: Latex, Molds & smuts, Capsicum annuum extract & derivative (bell pepper) [capsicum], Cat hair extract, Lactose intolerance (gi), Milk (cow), Milk-related compounds, Modafinil, Neomycin-bacitracin zn-polymyx, Other, Strawberry extract, Sulfa antibiotics, Wixela inhub [fluticasone-salmeterol], Bacitracin-polymyxin b, Onion, and Pollen extract   Assessment / Plan:     Visit Diagnoses: Autoimmune disease (HCC) - History of positive ANA, DS DNA, Raynauds, fatigue, oral ulcers, nasal ulcers, arthralgias, sicca: -Patient states she has been under stress.  She intermittently developed oral ulcers.  The ulcers resolved after using a mouthwash.  She continues to have pain and stiffness in her hands.  She has not had any recent problems with Raynaud's phenomenon. She complains of dry mouth, dry eyes, and photosensitivity.  Plan: Protein / creatinine ratio, urine, Anti-DNA antibody, double-stranded, C3 and C4, Sedimentation  rate  High risk medication use - Plaquenil 200 mg 1 tablet by mouth daily, Methotrexate 5 tablets by mouth once a week, and folic acid 1 mg daily. PLQ eye exam: Patient brought labs done at her PCPs office from 06/26/2023.August 05, 2023 CMP creatinine 0.89, AST 37, ALT 20, TSH normal, WBC 7.5, hemoglobin 14.6, platelets 310 differential normal, LDL 89, vitamin D 45.5.  Patient was advised to get labs every 3 months to monitor for drug toxicity.  Information on immunization was placed in the AVS.  She was advised to hold methotrexate if she develops an infection resume after the infection resolves.  Raynaud's disease without gangrene-currently not very active.  Right carpal tunnel syndrome-she cannot use to use carpal tunnel brace at nighttime.  She has mild paresthesias.  Achilles tendinosis of left lower extremity -not symptomatic.  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  Rash-patient has erythematous rash under her breast.  Most likely due to yeast infection.  Over-the-counter products were discussed.  I advised her to follow-up with her PCP if her symptoms do not improve.  History of fatigue-she continues to have some fatigue.  History of hypertension-blood pressure was elevated at 155/88.  Repeat blood pressure was 138/80.  She was advised to monitor blood pressure closely and follow-up with her PCP.  Other medical problems are listed as follows:  History of melanoma  History of hypothyroidism  History of asthma  History of sleep apnea  Orders: Orders Placed This Encounter  Procedures   Protein / creatinine ratio, urine   Anti-DNA antibody, double-stranded   C3 and C4   Sedimentation rate   No orders of the defined types were placed in this encounter.    Follow-Up Instructions: Return in about 5 months (around 01/17/2024) for Autoimmune disease.   Pollyann Savoy,  MD  Note - This record has been created using Animal nutritionist.  Chart creation errors have been sought, but may not always  have been located. Such creation errors do not reflect on  the standard of medical care.

## 2023-08-12 ENCOUNTER — Other Ambulatory Visit: Payer: Self-pay | Admitting: Internal Medicine

## 2023-08-15 ENCOUNTER — Ambulatory Visit: Payer: BC Managed Care – PPO | Admitting: Internal Medicine

## 2023-08-17 ENCOUNTER — Encounter: Payer: Self-pay | Admitting: Rheumatology

## 2023-08-17 ENCOUNTER — Ambulatory Visit: Payer: BC Managed Care – PPO | Attending: Rheumatology | Admitting: Rheumatology

## 2023-08-17 VITALS — BP 138/80 | HR 80 | Resp 16 | Ht 63.0 in | Wt 198.0 lb

## 2023-08-17 DIAGNOSIS — Z8709 Personal history of other diseases of the respiratory system: Secondary | ICD-10-CM

## 2023-08-17 DIAGNOSIS — Z8679 Personal history of other diseases of the circulatory system: Secondary | ICD-10-CM

## 2023-08-17 DIAGNOSIS — M359 Systemic involvement of connective tissue, unspecified: Secondary | ICD-10-CM | POA: Diagnosis not present

## 2023-08-17 DIAGNOSIS — Z85820 Personal history of malignant melanoma of skin: Secondary | ICD-10-CM

## 2023-08-17 DIAGNOSIS — G5601 Carpal tunnel syndrome, right upper limb: Secondary | ICD-10-CM

## 2023-08-17 DIAGNOSIS — Z8639 Personal history of other endocrine, nutritional and metabolic disease: Secondary | ICD-10-CM

## 2023-08-17 DIAGNOSIS — Z8669 Personal history of other diseases of the nervous system and sense organs: Secondary | ICD-10-CM

## 2023-08-17 DIAGNOSIS — Z79899 Other long term (current) drug therapy: Secondary | ICD-10-CM

## 2023-08-17 DIAGNOSIS — I7300 Raynaud's syndrome without gangrene: Secondary | ICD-10-CM

## 2023-08-17 DIAGNOSIS — R21 Rash and other nonspecific skin eruption: Secondary | ICD-10-CM

## 2023-08-17 DIAGNOSIS — Z87898 Personal history of other specified conditions: Secondary | ICD-10-CM

## 2023-08-17 DIAGNOSIS — M6788 Other specified disorders of synovium and tendon, other site: Secondary | ICD-10-CM

## 2023-08-17 NOTE — Patient Instructions (Addendum)
Vaccines You are taking a medication(s) that can suppress your immune system.  The following immunizations are recommended: Flu annually Covid-19  RSV Td/Tdap (tetanus, diphtheria, pertussis) every 10 years Pneumonia (Prevnar 15 then Pneumovax 23 at least 1 year apart.  Alternatively, can take Prevnar 20 without needing additional dose) Shingrix: 2 doses from 4 weeks to 6 months apart  Please check with your PCP to make sure you are up to date.  Standing Labs We placed an order today for your standing lab work.   Please have your standing labs drawn in November and every 3 months  Please have your labs drawn 2 weeks prior to your appointment so that the provider can discuss your lab results at your appointment, if possible.  Please note that you may see your imaging and lab results in MyChart before we have reviewed them. We will contact you once all results are reviewed. Please allow our office up to 72 hours to thoroughly review all of the results before contacting the office for clarification of your results.  WALK-IN LAB HOURS  Monday through Thursday from 8:00 am -12:30 pm and 1:00 pm-5:00 pm and Friday from 8:00 am-12:00 pm.  Patients with office visits requiring labs will be seen before walk-in labs.  You may encounter longer than normal wait times. Please allow additional time. Wait times may be shorter on  Monday and Thursday afternoons.  We do not book appointments for walk-in labs. We appreciate your patience and understanding with our staff.   Labs are drawn by [facility]. Please bring your co-pay at the time of your lab draw.  You may receive a bill from [facility] for your lab work.  Please note if you are on Hydroxychloroquine and and an order has been placed for a Hydroxychloroquine level,  you will need to have it drawn 4 hours or more after your last dose.  If you wish to have your labs drawn at another location, please call the office 24 hours in advance so we can fax the  orders.  The office is located at [address], [place], [place] [address]   If you have any questions regarding directions or hours of operation,  please call [phone].   As a reminder, please drink plenty of water prior to coming for your lab work. Thanks!   If you have signs or symptoms of an infection or start antibiotics: First, call your PCP for workup of your infection. Hold your medication through the infection, until you complete your antibiotics, and until symptoms resolve if you take the following: Injectable medication (Actemra, Benlysta, Cimzia, Cosentyx, Enbrel, Humira, Kevzara, Orencia, Remicade, Simponi, Stelara, Taltz, Tremfya) Methotrexate Leflunomide (Arava) Mycophenolate (Cellcept) Harriette Ohara, Olumiant, or Rinvoq

## 2023-08-18 LAB — SEDIMENTATION RATE: Sed Rate: 2 mm/h (ref 0–30)

## 2023-08-18 LAB — PROTEIN / CREATININE RATIO, URINE
Creatinine, Urine: 71 mg/dL (ref 20–275)
Total Protein, Urine: <4 mg/dL — ABNORMAL LOW (ref 5–24)

## 2023-08-18 LAB — C3 AND C4
C3 Complement: 144 mg/dL (ref 83–193)
C4 Complement: 22 mg/dL (ref 15–57)

## 2023-08-18 LAB — ANTI-DNA ANTIBODY, DOUBLE-STRANDED: ds DNA Ab: 1 [IU]/mL

## 2023-08-19 NOTE — Progress Notes (Signed)
Sed rate normal, complements normal, double-stranded DNA negative, urine protein creatinine ratio normal.  Labs do not indicate an active autoimmune disease.

## 2023-08-21 ENCOUNTER — Other Ambulatory Visit: Payer: Self-pay | Admitting: Physician Assistant

## 2023-08-22 NOTE — Telephone Encounter (Signed)
Last Fill: 05/18/2023  Labs: 08/04/2023 CMP creatinine 0.89, AST 37, ALT 20, TSH normal, WBC 7.5, hemoglobin 14.6, platelets 310 differential normal   Next Visit: 01/18/2024  Last Visit: 08/17/2023  DX: Autoimmune disease   Current Dose per office note 08/17/2023: Methotrexate 5 tablets by mouth once a week   Okay to refill Methotrexate?
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
## 2023-08-31 ENCOUNTER — Ambulatory Visit: Payer: BC Managed Care – PPO | Admitting: Internal Medicine

## 2023-08-31 ENCOUNTER — Encounter: Payer: Self-pay | Admitting: Internal Medicine

## 2023-08-31 VITALS — BP 128/74 | HR 80 | Ht 63.0 in | Wt 198.0 lb

## 2023-08-31 DIAGNOSIS — J4520 Mild intermittent asthma, uncomplicated: Secondary | ICD-10-CM

## 2023-08-31 DIAGNOSIS — G4710 Hypersomnia, unspecified: Secondary | ICD-10-CM | POA: Diagnosis not present

## 2023-08-31 MED ORDER — SPACER/AERO-HOLDING CHAMBERS DEVI: 0 refills | Status: AC

## 2023-08-31 NOTE — Patient Instructions (Signed)
We can continue current meds  Glad you are doing well. Please call if we can help

## 2023-09-03 ENCOUNTER — Encounter: Payer: Self-pay | Admitting: Internal Medicine

## 2023-09-03 NOTE — Assessment & Plan Note (Addendum)
Recent mild exacerbation responded to her meds Plan- no changes needed. Asks replacement Aerochaber

## 2023-09-03 NOTE — Assessment & Plan Note (Signed)
Managed by Neurology

## 2023-09-09 ENCOUNTER — Other Ambulatory Visit: Payer: Self-pay | Admitting: Physician Assistant

## 2023-09-09 DIAGNOSIS — M359 Systemic involvement of connective tissue, unspecified: Secondary | ICD-10-CM

## 2023-09-11 NOTE — Telephone Encounter (Signed)
Last Fill: 06/16/2023  Eye exam: 06/26/2023 WNL   Labs: August 05, 2023 CMP creatinine 0.89, AST 37, ALT 20, TSH normal, WBC 7.5, hemoglobin 14.6, platelets 310 differential normal, LDL 89, vitamin D 45.5.   Next Visit: 01/18/2024  Last Visit: 08/17/2023  DX:Autoimmune disease   Current Dose per office note 08/17/2023: Plaquenil 200 mg 1 tablet by mouth daily   Okay to refill Plaquenil?

## 2023-10-08 ENCOUNTER — Other Ambulatory Visit: Payer: Self-pay | Admitting: Physician Assistant

## 2023-10-09 NOTE — Telephone Encounter (Signed)
Last Fill: 04/04/2023  Next Visit: 01/18/2024  Last Visit: 08/17/2023  Dx: Autoimmune disease   Current Dose per office note on 08/17/2023: not discussed+++  Okay to refill Zofran?

## 2023-11-06 ENCOUNTER — Other Ambulatory Visit: Payer: Self-pay | Admitting: Physician Assistant

## 2023-11-13 NOTE — Progress Notes (Unsigned)
PATIENT: Misty Curtis DOB: 1961-12-11  REASON FOR VISIT: follow up for hypersomnia  HISTORY FROM: patient PRIMARY NEUROLOGIST: Dr. Vickey Huger   ASSESSMENT AND PLAN 62 y.o. year old female  has a past medical history of Allergic rhinitis, Bronchitis, Cataract, Lupus (systemic lupus erythematosus) (HCC), Melanoma (HCC), Rhinosinusitis, and Skin cancer. here with:  1.  Idiopathic hypersomnia   -Having very good benefit with armodafinil, will continue 150 mg daily in the morning.  Should have 1 refill left -Previously tried and failed: Sunosi was denied, Provigil increased BP, previously tried CPAP but offer no clear benefit to her daytime sleepiness, HLA narcolepsy testing was negative -Follow-up in 1 year or sooner if needed  HISTORY OF PRESENT ILLNESS: Today 11/14/23  Sunosi was denied. We switched to armodafinil in June 2024. 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 Takes armodafinil at 6 AM, experiences good energy and no hypersomnia till about 9 PM.  She sleeps great at night.  She gets about 1 to 2 hours of extended benefit with armodafinil versus Provigil.  She is currently pleased.  Blood pressure was elevated today, but has been running in the 120s over 70s.  Denies any side effects of armodafinil.  04/18/23 SS: Is now retired. Goes once a month to [place] to care for her uncle. On Provigil 150 mg in AM. Has noticed that when she takes it, her BP is higher. She really only takes 100 mg when she takes it. Still think she needs it, does substitute occasionally. Takes around 7 AM, usually keeps her awake. The Provigil keeps the long dozing spells away. If she is focused, she will not fall asleep. ESS 12. She drinks 3 glasses of sweet tea.  Update 03/22/22 SS: Misty Curtis here today for follow-up. Taking Provigil 150 mg in AM at 8 AM. Keeps her awake and active, and she still can sleep. Is retired fully now. Substitutes when she wants, keeps her grandchildren. Had lupus flare back in January, still on  Methotrexate and Plaquenil. Mother passed away in 2023-07-28. She is assisting her elderly uncle in his affairs, goes to [place] once a month. BP up today, took her medication, keeps check on it at home, runs in 120's.  Update 02/19/21 SS: Misty Curtis is a 62 year old female with history of idiopathic hypersomnia.  Remains on Provigil 150 mg daily.  200 mg was too much, 100 mg was not enough.  The 150 mg dosage seems to work well.  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  Had a rough couple months with her lupus.  BP elevated today, checks at home, usually 140/80.  Recently had left Achilles reconstructive surgery.  Here today for evaluation unaccompanied. FSS was 32.   Update February 26, 2020 SS: Misty Curtis is a 62 year old female with history of idiopathic hypersomnia.  She remains on Provigil 200 mg daily, however she is only taking half tablet.  When she takes the 200 mg, she says she will stay up till midnight.  When taking 100 mg, around 630, she will lay on the couch, fall asleep. She seems to do well during the day, up until 6:30 pm. 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  In the last several months, she has had Achilles tendon reconstruction, and 2 breast lumpectomies.  In the last year, she has gained about 10 pounds.  She is not sure  if her increased drowsiness, is related to deconditioning or weight gain?  She would like to try dose modification.  She presents today for evaluation unaccompanied. ESS was 12.  HISTORY 02/20/2019 SS: Misty Curtis is a 61 year old female with a history of idiopathic hypersomnia.  She is currently taking Provigil 200 mg daily however she is only taking half tablet.  She reports she is tolerating the medication well.  She will take it around 6:30 AM and she reports benefit until about 9:30 PM whenever she goes to bed.  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  She reports she  is able to do her job well without significant fatigue.  She returns today requesting a refill on her Provigil.  She presents today for evaluation unaccompanied.  She denies any new problems or concerns.  She is wearing a boot to her right foot that is healing from a prior injury.  REVIEW OF SYSTEMS: Out of a complete 14 system review of symptoms, the patient complains only of the following symptoms, and all other reviewed systems are negative.  See HPI  ALLERGIES: Allergies  Allergen Reactions   Latex Other (See Comments) and Rash   Molds & Smuts Rash and Shortness Of Breath    Other Reaction(s): Bronchospasm, Cough, Respiratory Distress   Capsicum Annuum Extract & Derivative (Bell Pepper) [Capsicum] Swelling    Green peppers   Cat Hair Extract     Other Reaction(s): Other (See Comments)   Lactose Intolerance (Gi) Diarrhea   Milk (Cow) Hives and Itching    Other Reaction(s): Diarrhea, GI Intolerance   Milk-Related Compounds Diarrhea   Modafinil Hypertension   Neomycin-Bacitracin Zn-Polymyx     REACTION: rash   Other     Pollen and mold.    Strawberry Extract    Sulfa Antibiotics     Other Reaction(s): GI Intolerance   Wixela Inhub [Fluticasone-Salmeterol]     Per patient - caused throat to close and laryngitis   Bacitracin-Polymyxin B Rash   Onion Hives, Itching, Rash and Swelling    Also allergic to peppers and hot peppers and dairy  Other Reaction(s): Diarrhea, GI Intolerance  Also allergic to peppers and hot peppers, , Also allergic to peppers and hot peppers and dairy   Pollen Extract Swelling and Rash    Other Reaction(s): Bronchospasm, Respiratory Distress    HOME MEDICATIONS: Outpatient Medications Prior to Visit  Medication Sig Dispense Refill   albuterol (VENTOLIN HFA) 108 (90 Base) MCG/ACT inhaler Inhale 2 puffs into the lungs every 6 (six) hours as needed for wheezing or shortness of breath. 1 each 12   Armodafinil 150 MG tablet Take 1 tablet (150 mg total) by  mouth in the morning. 30 tablet 5   Ascorbic Acid (VITAMIN C) 500 MG tablet Take 500 mg by mouth daily.     B Complex Vitamins (VITAMIN B COMPLEX PO) Take by mouth.     BIOTENE DRY MOUTH (BIOTENE) LIQD Place 1 application onto teeth as needed.     budesonide-formoterol (SYMBICORT) 80-4.5 MCG/ACT inhaler INHALE 2 PUFFS INTO THE LUNGS IN THE MORNING AND AT BEDTIME 10.2 g 5   Cholecalciferol (VITAMIN D) 2000 units CAPS Take by mouth.     clotrimazole-betamethasone (LOTRISONE) cream APPLY TOPICALLY TO THE AFFECTED AREA TWICE DAILY 30 g prn   diclofenac Sodium (VOLTAREN) 1 % GEL Apply 2-4 grams to affected joint 4 times daily as needed. 400 g 2   Digestive Enzymes (DIGESTIVE ENZYME PO) Take by mouth.  fluticasone (FLONASE) 50 MCG/ACT nasal spray SHAKE LIQUID AND USE 2 SPRAYS IN EACH NOSTRIL DAILY 16 g 12   folic acid (FOLVITE) 1 MG tablet TAKE 1 TABLET(1 MG) BY MOUTH DAILY 90 tablet 3   hydroxychloroquine (PLAQUENIL) 200 MG tablet TAKE 1 TABLET(200 MG) BY MOUTH DAILY 90 tablet 0   lactase (LACTAID) 3000 UNITS tablet Take by mouth as needed.      lisinopril (ZESTRIL) 20 MG tablet Take 10 mg by mouth every morning.     loratadine (CLARITIN) 10 MG tablet Take 10 mg by mouth daily.     methotrexate (RHEUMATREX) 2.5 MG tablet TAKE 5 TABLETS BY MOUTH 1 TIME WEEKLY 60 tablet 0   MISC NATURAL PRODUCTS PO Take by mouth. Flex Guard     montelukast (SINGULAIR) 10 MG tablet Take 1 tablet (10 mg total) by mouth daily. 30 tablet 6   mupirocin ointment (BACTROBAN) 2 % APPLY EXTERNALLY TO THE AFFECTED AREA TWICE DAILY AS DIRECTED 22 g 12   NON FORMULARY Doterra Essential Oils:  Eucalyptus, [place], Lemongrass, Frankincense, [place], On Guard     ondansetron (ZOFRAN) 4 MG tablet TAKE 1 TABLET(4 MG) BY MOUTH EVERY 8 HOURS AS NEEDED FOR NAUSEA OR VOMITING 30 tablet 0   Spacer/Aero-Holding Chambers DEVI USE AS DIRECTED 1 each 0   Specialty Vitamins Products (CENTRUM PERFORMANCE) TABS Take 1 tablet by mouth  daily.     SYNTHROID 75 MCG tablet Take 75 mcg by mouth every morning.     phenylephrine (SUDAFED PE) 10 MG TABS tablet Take 10 mg by mouth every 4 (four) hours as needed.     Solriamfetol HCl (SUNOSI) 75 MG TABS Take 1 tablet (75 mg total) by mouth in the morning. 30 tablet 5   No facility-administered medications prior to visit.    PAST MEDICAL HISTORY: Past Medical History:  Diagnosis Date   Allergic rhinitis    skin test 01-18-10   Bronchitis    Cataract    left eye, per patient   Lupus (systemic lupus erythematosus) (HCC)    joints and skin rash. Dr. Corliss Skains   Melanoma [facility])    Rhinosinusitis    Skin cancer     PAST SURGICAL HISTORY: Past Surgical History:  Procedure Laterality Date   ABDOMINAL HYSTERECTOMY     ACHILLES TENDON REPAIR Bilateral    Dr. Cleophas Dunker    BREAST LUMPECTOMY Left    08/21/2019, 09/20/2019   KNEE ARTHROPLASTY     skin cancer extraction     skin cancer extraction  2023   right side of face    FAMILY HISTORY: Family History  Problem Relation Age of Onset   Stroke Mother    Allergies Father    Heart disease Father        bacterial endocarditis   Hypothyroidism Sister    Hypothyroidism Sister    Hashimoto's thyroiditis Sister    Cancer Maternal Grandmother    Hepatitis Son     SOCIAL HISTORY: Social History   Socioeconomic History   Marital status: Married    Spouse name: Not on file   Number of children: 3   Years of education: Not on file   Highest education level: Not on file  Occupational History   Occupation: special ed Production designer, [employer] [place]  Tobacco Use   Smoking status: Never    Passive exposure: Past   Smokeless tobacco: Never  Vaping Use   Vaping status: Never Used  Substance and Sexual Activity   Alcohol use: Yes  Alcohol/week: 1.0 standard drink of alcohol    Types: 1 Standard drinks or equivalent per week    Comment: 1 MONTHLY   Drug use: No   Sexual activity: Yes    Birth control/protection: None  Other  Topics Concern   Not on file  Social History Narrative   Not on file   Social Determinants of Health   Financial Resource Strain: Not on file  Food Insecurity: Not on file  Transportation Needs: Not on file  Physical Activity: Not on file  Stress: Not on file  Social Connections: Not on file  Intimate Partner Violence: Not on file   PHYSICAL EXAM  Vitals:   11/14/23 1042  BP: (!) 167/90  Pulse: 79  Resp: 16  Weight: 199 lb 8 oz (90.5 kg)  Height: 5\' 3"  (1.6 m)    Body mass index is 35.34 kg/m.  Generalized: Well developed, in no acute distress  Neurological examination  Mentation: Alert oriented to time, place, history taking. Follows all commands speech and language fluent Cranial nerve II-XII: Pupils were equal round reactive to light. Extraocular movements were full, visual field were full on confrontational test. Facial sensation and strength were normal. Head turning and shoulder shrug were normal and symmetric. Motor: The motor testing reveals 5 over 5 strength of all 4 extremities. Good symmetric motor tone is noted throughout.  Sensory: Sensory testing is intact to soft touch on all 4 extremities. No evidence of extinction is noted.  Coordination: Cerebellar testing reveals good finger-nose-finger and heel-to-shin bilaterally.  Gait and station: Gait is normal.  Reflexes: Deep tendon reflexes are symmetric and normal bilaterally.   DIAGNOSTIC DATA (LABS, IMAGING, TESTING) - I reviewed patient records, labs, notes, testing and imaging myself where available.  Lab Results  Component Value Date   WBC 8.5 03/14/2023   HGB 15.1 03/14/2023   HCT 44.5 03/14/2023   MCV 90.3 03/14/2023   PLT 312 03/14/2023      Component Value Date/Time   NA 140 03/14/2023 1014   K 4.2 03/14/2023 1014   CL 103 03/14/2023 1014   CO2 31 03/14/2023 1014   GLUCOSE 86 03/14/2023 1014   BUN 16 03/14/2023 1014   CREATININE 0.91 03/14/2023 1014   CALCIUM 9.9 03/14/2023 1014   PROT  6.9 03/14/2023 1014   ALBUMIN 4.1 06/02/2017 0945   AST 37 (H) 03/14/2023 1014   ALT 26 03/14/2023 1014   ALKPHOS 82 06/02/2017 0945   BILITOT 0.4 03/14/2023 1014   GFRNONAA 60 04/12/2021 1618   GFRAA 70 04/12/2021 1618   No results found for: "CHOL", "HDL", "LDLCALC", "LDLDIRECT", "TRIG", "CHOLHDL" No results found for: "HGBA1C" No results found for: "VITAMINB12" No results found for: "TSH"   Margie Ege, AGNP-C, DNP 11/14/2023, 10:45 AM [facility] [address], [address] [place], [place] [address] [phone]

## 2023-11-14 ENCOUNTER — Ambulatory Visit: Payer: BC Managed Care – PPO | Admitting: Neurology

## 2023-11-14 VITALS — BP 167/90 | HR 79 | Resp 16 | Ht 63.0 in | Wt 199.5 lb

## 2023-11-14 DIAGNOSIS — G4710 Hypersomnia, unspecified: Secondary | ICD-10-CM | POA: Diagnosis not present

## 2023-11-14 NOTE — Patient Instructions (Signed)
We will continue armodafinil 150 mg daily.  You should have 1 refill left.  Continue to monitor blood pressure.  Follow-up in 1 year.  Thanks

## 2023-11-22 ENCOUNTER — Other Ambulatory Visit: Payer: Self-pay | Admitting: *Deleted

## 2023-11-22 DIAGNOSIS — Z79899 Other long term (current) drug therapy: Secondary | ICD-10-CM

## 2023-11-22 MED ORDER — METHOTREXATE SODIUM 2.5 MG PO TABS: 12.5000 mg | ORAL_TABLET | ORAL | 0 refills | Status: DC

## 2023-11-22 NOTE — Telephone Encounter (Signed)
Last Fill: 08/22/2023  Labs: 08/04/2023 CMP creatinine 0.89, AST 37, ALT 20, TSH normal, WBC 7.5, hemoglobin 14.6, platelets 310 differential normal   Next Visit: 01/18/2024  Last Visit: 08/17/2023  DX: Autoimmune disease   Current Dose per office note 08/17/2023: Methotrexate 5 tablets by mouth once a week   Patient aware she is due to update labs and plans to update them this week.   Okay to refill Methotrexate?

## 2023-11-23 ENCOUNTER — Other Ambulatory Visit: Payer: Self-pay | Admitting: *Deleted

## 2023-11-23 DIAGNOSIS — Z79899 Other long term (current) drug therapy: Secondary | ICD-10-CM

## 2023-11-24 LAB — COMPLETE METABOLIC PANEL WITH GFR
AG Ratio: 1.7 (calc) (ref 1.0–2.5)
ALT: 14 U/L (ref 6–29)
AST: 26 U/L (ref 10–35)
Albumin: 4 g/dL (ref 3.6–5.1)
Alkaline phosphatase (APISO): 86 U/L (ref 37–153)
BUN: 14 mg/dL (ref 7–25)
CO2: 30 mmol/L (ref 20–32)
Calcium: 9.4 mg/dL (ref 8.6–10.4)
Chloride: 105 mmol/L (ref 98–110)
Creat: 0.81 mg/dL (ref 0.50–1.05)
Globulin: 2.3 g/dL (ref 1.9–3.7)
Glucose, Bld: 101 mg/dL — ABNORMAL HIGH (ref 65–99)
Potassium: 4 mmol/L (ref 3.5–5.3)
Sodium: 143 mmol/L (ref 135–146)
Total Bilirubin: 0.4 mg/dL (ref 0.2–1.2)
Total Protein: 6.3 g/dL (ref 6.1–8.1)
eGFR: 81 mL/min/{1.73_m2} (ref >=60)

## 2023-11-24 LAB — CBC WITH DIFFERENTIAL/PLATELET
Absolute Lymphocytes: 2738 {cells}/uL (ref 850–3900)
Absolute Monocytes: 555 {cells}/uL (ref 200–950)
Basophils Absolute: 60 {cells}/uL (ref 0–200)
Basophils Relative: 0.8 %
Eosinophils Absolute: 225 {cells}/uL (ref 15–500)
Eosinophils Relative: 3 %
HCT: 44.4 % (ref 35.0–45.0)
Hemoglobin: 15 g/dL (ref 11.7–15.5)
MCH: 30.7 pg (ref 27.0–33.0)
MCHC: 33.8 g/dL (ref 32.0–36.0)
MCV: 91 fL (ref 80.0–100.0)
MPV: 9.4 fL (ref 7.5–12.5)
Monocytes Relative: 7.4 %
Neutro Abs: 3923 {cells}/uL (ref 1500–7800)
Neutrophils Relative %: 52.3 %
Platelets: 318 10*3/uL (ref 140–400)
RBC: 4.88 10*6/uL (ref 3.80–5.10)
RDW: 13.2 % (ref 11.0–15.0)
Total Lymphocyte: 36.5 %
WBC: 7.5 10*3/uL (ref 3.8–10.8)

## 2023-11-24 NOTE — Progress Notes (Signed)
CBC and CMP are normal.

## 2023-12-11 ENCOUNTER — Other Ambulatory Visit: Payer: Self-pay | Admitting: Rheumatology

## 2023-12-11 DIAGNOSIS — M359 Systemic involvement of connective tissue, unspecified: Secondary | ICD-10-CM

## 2023-12-15 ENCOUNTER — Other Ambulatory Visit: Payer: Self-pay | Admitting: Rheumatology

## 2023-12-15 NOTE — Telephone Encounter (Signed)
Last Fill: 11/22/2023 (30 day supply)  Labs: 11/23/2023 CBC and CMP are normal.   Next Visit: 01/18/2024  Last Visit: 08/17/2023  DX: Autoimmune disease   Current Dose per office note 08/17/2023: Methotrexate 5 tablets by mouth once a week   Okay to refill Methotrexate?

## 2023-12-25 ENCOUNTER — Encounter: Payer: Self-pay | Admitting: Internal Medicine

## 2023-12-29 MED ORDER — MONTELUKAST SODIUM 10 MG PO TABS: 10.0000 mg | ORAL_TABLET | Freq: Every day | ORAL | 6 refills | Status: DC

## 2024-01-01 ENCOUNTER — Other Ambulatory Visit: Payer: Self-pay

## 2024-01-01 MED ORDER — ARMODAFINIL 150 MG PO TABS: 150.0000 mg | ORAL_TABLET | Freq: Every morning | ORAL | 5 refills | Status: DC

## 2024-01-01 NOTE — Telephone Encounter (Signed)
 Requested Prescriptions   Pending Prescriptions Disp Refills   Armodafinil  150 MG tablet 30 tablet 5    Sig: Take 1 tablet (150 mg total) by mouth in the morning.   Last seen 11/14/23, next appt 11/26/24 Dispenses    Dispensed Days Supply Quantity Provider Pharmacy  ARMODAFINIL  150MG  TABLETS 11/07/2023 30 30 each Gayland Lauraine PARAS, NP [facility] #...  ARMODAFINIL  150MG  TABLETS 10/08/2023 30 30 each Gayland Lauraine PARAS, NP [facility] #...  ARMODAFINIL  150MG  TABLETS 08/30/2023 30 30 each Gayland Lauraine PARAS, NP [facility]ORE #...  ARMODAFINIL  150MG  TABLETS 06/28/2023 30 30 each Gayland Lauraine PARAS, NP [facility] #.[id number]

## 2024-01-04 NOTE — Progress Notes (Unsigned)
Office Visit Note  Patient: Misty Curtis             Date of Birth: 08-05-1961           MRN: [id number]             PCP: Ronal Fear, NP (Inactive) Referring: No ref. provider found Visit Date: 01/18/2024 Occupation: @GUAROCC @  Subjective:  Right wrist pain  History of Present Illness: Misty Curtis is a 63 y.o. female with history of autoimmune disease.  Patient remains on Plaquenil 200 mg 1 tablet by mouth daily, Methotrexate 5 tablets by mouth once a week, and folic acid 1 mg daily.  She is tolerating combination therapy without any side effects and has not had any recent gaps in therapy.  She denies any signs or symptoms of an autoimmune disease flare recently.  She has intermittent symptoms of Raynaud's phenomenon but denies any signs of sclerodactyly.  Patient continues to have chronic sicca symptoms.  She has been using humidifier in her bedroom as well as nasal spray and Visine for dry eyes.  She continues to see her ophthalmologist on a regular basis follows dentist every 6 months.  She has upcoming appointment with her dentist in March 2025.  Patient denies any inflammatory arthritis flares recently.  Patient states that on Sunday she was during a large crockpot full of food for her family and felt a pop in her right wrist.  Patient states that she was having significant pain in her right wrist especially with activity and range of motion.  The pain has been radiating to her right fourth and fifth digits.  She has been using Voltaren gel topically for pain relief and has been using a compression glove at night and a brace throughout the day.  Patient continues to have some swelling over the ulnar aspect of her right wrist.  She denies any bruising.  She has not yet followed up with a hand specialist.     Activities of Daily Living:  Patient reports morning stiffness for 30 minutes.   Patient Reports nocturnal pain.  Difficulty dressing/grooming: Denies Difficulty climbing stairs:  Denies Difficulty getting out of chair: Denies Difficulty using hands for taps, buttons, cutlery, and/or writing: Reports  Review of Systems  Constitutional:  Positive for fatigue.  HENT:  Positive for mouth dryness and nose dryness. Negative for mouth sores.   Eyes:  Positive for dryness. Negative for pain.  Respiratory:  Negative for shortness of breath and difficulty breathing.   Cardiovascular:  Negative for chest pain and palpitations.  Gastrointestinal:  Negative for blood in stool, constipation and diarrhea.  Endocrine: Positive for increased urination.  Genitourinary:  Positive for involuntary urination.  Musculoskeletal:  Positive for joint pain, joint pain, myalgias, morning stiffness, muscle tenderness and myalgias. Negative for gait problem, joint swelling and muscle weakness.  Skin:  Positive for color change and hair loss. Negative for rash and sensitivity to sunlight.  Allergic/Immunologic: Negative for susceptible to infections.  Neurological:  Negative for dizziness and headaches.  Hematological:  Negative for swollen glands.  Psychiatric/Behavioral:  Negative for depressed mood and sleep disturbance. The patient is not nervous/anxious.     PMFS History:  Patient Active Problem List   Diagnosis Date Noted   Hypersomnia 04/18/2023   Heel pain, chronic, left 09/30/2020   Achilles tendinitis, left leg 08/19/2020   Heel pain, chronic, right 05/02/2019   Thrush 06/11/2018   Acquired hypothyroidism 12/08/2016   Autoimmune disease (HCC) 12/06/2016  Other fatigue 12/06/2016   Raynaud's disease without gangrene 12/06/2016   High risk medication use 12/06/2016   Allergic asthma, mild intermittent, uncomplicated 12/06/2016   History of hypertension 12/06/2016   Excessive daytime sleepiness 07/26/2016   Snoring 07/26/2016   Laryngitis 11/10/2012   Postnasal drip 05/02/2012   Acute sinusitis, unspecified 10/24/2011   HYPERTENSION 12/14/2009   BRONCHITIS 12/14/2009    LUPUS 12/14/2009   History of melanoma 12/14/2009   Seasonal and perennial allergic rhinitis 12/14/2009    Past Medical History:  Diagnosis Date   Allergic rhinitis    skin test 01-18-10   Bronchitis    Cataract    left eye, per patient   Lupus (systemic lupus erythematosus) (HCC)    joints and skin rash. Dr. Corliss Skains   Melanoma [facility])    Rhinosinusitis    Skin cancer     Family History  Problem Relation Age of Onset   Stroke Mother    Allergies Father    Heart disease Father        bacterial endocarditis   Hypothyroidism Sister    Hypothyroidism Sister    Hashimoto's thyroiditis Sister    Cancer Maternal Grandmother    Hepatitis Son    Past Surgical History:  Procedure Laterality Date   ABDOMINAL HYSTERECTOMY     ACHILLES TENDON REPAIR Bilateral    Dr. Cleophas Dunker    BREAST LUMPECTOMY Left    08/21/2019, 09/20/2019   KNEE ARTHROPLASTY     skin cancer extraction     skin cancer extraction  2023   right side of face   Social History   Social History Narrative   Not on file   Immunization History  Administered Date(s) Administered   Influenza Split 09/19/2011, 09/12/2012, 09/02/2013   Influenza-Unspecified 09/19/2011, 09/12/2012, 09/02/2013, 10/19/2014   Moderna Covid-19 Fall Seasonal Vaccine 17yrs & older 12/01/2022   Moderna Sars-Covid-2 Vaccination 02/26/2020, 03/25/2020, 12/09/2020   Pneumococcal Polysaccharide-23 08/19/2010   Pneumococcal-Unspecified 07/01/2020     Objective: Vital Signs: BP (!) 159/84 (BP Location: Left Arm, Patient Position: Sitting, Cuff Size: Normal)   Pulse 68   Resp 15   Ht 5\' 3"  (1.6 m)   Wt 200 lb 6.4 oz (90.9 kg)   BMI 35.50 kg/m    Physical Exam Vitals and nursing note reviewed.  Constitutional:      Appearance: She is well-developed.  HENT:     Head: Normocephalic and atraumatic.  Eyes:     Conjunctiva/sclera: Conjunctivae normal.  Cardiovascular:     Rate and Rhythm: Normal rate and regular rhythm.     Heart sounds:  Normal heart sounds.  Pulmonary:     Effort: Pulmonary effort is normal.     Breath sounds: Normal breath sounds.  Abdominal:     General: Bowel sounds are normal.     Palpations: Abdomen is soft.  Musculoskeletal:     Cervical back: Normal range of motion.  Lymphadenopathy:     Cervical: No cervical adenopathy.  Skin:    General: Skin is warm and dry.     Capillary Refill: Capillary refill takes less than 2 seconds.  Neurological:     Mental Status: She is alert and oriented to person, place, and time.  Psychiatric:        Behavior: Behavior normal.      Musculoskeletal Exam: C-spine has good range of motion.  Thoracic kyphosis noted.  Shoulder joints and elbow joints have good range of motion.  Some tenderness and mild inflammation on the ulnar  aspect of her right wrist.  No erythema or ecchymosis noted.  She has discomfort and slightly limited extension of the right wrist.  Left wrist has full range of motion.  No tenderness or synovitis over MCP joints.  PIP and DIP thickening noted.  Hip joints have good range of motion with no groin pain.  Knee joints have good range of motion no warmth or effusion.  Ankle joints have good range of motion with no tenderness or joint swelling.  No tenderness or synovitis over MTP joints.  CDAI Exam: CDAI Score: -- Patient Global: --; Provider Global: -- Swollen: --; Tender: -- Joint Exam 01/18/2024   No joint exam has been documented for this visit   There is currently no information documented on the homunculus. Go to the Rheumatology activity and complete the homunculus joint exam.  Investigation: No additional findings.  Imaging: No results found.  Recent Labs: Lab Results  Component Value Date   WBC 7.5 11/23/2023   HGB 15.0 11/23/2023   PLT 318 11/23/2023   NA 143 11/23/2023   K 4.0 11/23/2023   CL 105 11/23/2023   CO2 30 11/23/2023   GLUCOSE 101 (H) 11/23/2023   BUN 14 11/23/2023   CREATININE 0.81 11/23/2023   BILITOT  0.4 11/23/2023   ALKPHOS 82 06/02/2017   AST 26 11/23/2023   ALT 14 11/23/2023   PROT 6.3 11/23/2023   ALBUMIN 4.1 06/02/2017   CALCIUM 9.4 11/23/2023   GFRAA 70 04/12/2021    Speciality Comments: PLQ eye exam: 06/26/2023 WNL at Dr Venita Lick. Lawernce Ion, OD Follow up in 1 year  Procedures:  No procedures performed Allergies: Latex, Molds & smuts, Capsicum annuum extract & derivative (bell pepper) [capsicum], Cat dander, Lactose intolerance (gi), Milk (cow), Milk-related compounds, Modafinil, Neomycin-bacitracin zn-polymyx, Other, Strawberry extract, Sulfa antibiotics, Wixela inhub [fluticasone-salmeterol], Bacitracin-polymyxin b, Onion, and Pollen extract    Assessment / Plan:     Visit Diagnoses: Autoimmune disease (HCC) - History of positive ANA, DS DNA, Raynauds, fatigue, oral ulcers, nasal ulcers, arthralgias, sicca: She has not had any signs or symptoms of a flare recently.  Patient continues to experience chronic sicca symptoms as well as intermittent symptoms of Raynaud's phenomenon.  She has been using nasal spray as well as humidifier to alleviate dryness.  She continues to see the dentist every 6 months and has been following up closely with ophthalmology.  No oral or nasal ulcerations currently.  No synovitis was noted on examination today.  Overall she has clinically been doing well taking Plaquenil 200 mg 1 tablet by mouth daily methotrexate 5 tablets by mouth once weekly.  She continues to tolerate combination therapy without any side effects and has not had any recent gaps in therapy. Lab work from 08/17/2023 was reviewed today in the office: No proteinuria, sed rate within normal limits, complements within normal limits, and double-stranded DNA negative.  The following lab work will be obtained with her next lab draw due in March 2025--Future orders placed today. She will remain on combination therapy without any side effects. She was advised to notify us if she develops any signs or  symptoms of a flare.  She will follow up in 5 months or sooner if needed.  - Plan: Protein / creatinine ratio, urine, CBC with Differential/Platelet, COMPLETE METABOLIC PANEL WITH GFR, ANA, C3 and C4, Anti-DNA antibody, double-stranded, Sedimentation rate, hydroxychloroquine (PLAQUENIL) 200 MG tablet  High risk medication use - Plaquenil 200 mg 1 tablet by mouth daily, Methotrexate 5 tablets by  mouth once a week, and folic acid 1 mg daily.  CBC and CMP were drawn on 11/23/2023. Discussed the importance of holding methotrexate if she develops signs or symptoms of an infection and to resume once the infection is completely cleared. PLQ eye exam: 06/26/2023 WNL at Dr Venita Lick. Lawernce Ion, OD Follow up in 1 year  - Plan: CBC with Differential/Platelet, COMPLETE METABOLIC PANEL WITH GFR  Raynaud's disease without gangrene: No digital ulcerations.  No signs of sclerodactyly.   Pain in right wrist: Patient was during a large crockpot full of food on Sunday and felt a pop on the ulnar aspect of her right wrist.  She has been experiencing discomfort and limitation with extension of the right wrist.  No obvious evidence of extensor tendon rupture noted on exam.  No ecchymosis or erythema noted.  She has tenderness and mild inflammation over the ulnar aspect of her right wrist.  She has been using Voltaren gel, wearing a compression glove at night, and using a brace during the day for support.  Her symptoms have gradually started to improve.  Offered to obtain an x-ray today.  Discussed that she may benefit from an evaluation by her hand specialist but she would like to hold off at this time.  Discussed that if her symptoms persist or worsen I would recommend scheduling an appointment at the hand center for further evaluation.  She voiced understanding.  Right carpal tunnel syndrome: S/p surgical release in the past.    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   History of fatigue: Chronic, stable.  Other medical conditions are listed as follows:  History of hypertension: Blood pressure was 159/84 today in the office.  Patient was advised to monitor blood pressure closely and to reach out to PCP if it remains elevated.  History of melanoma  History of hypothyroidism  History of asthma  History of sleep apnea  Orders: Orders Placed This Encounter  Procedures   Protein / creatinine ratio, urine   CBC with Differential/Platelet   COMPLETE METABOLIC PANEL WITH GFR   ANA   C3 and C4   Anti-DNA antibody, double-stranded   Sedimentation rate   Meds ordered this encounter  Medications   hydroxychloroquine (PLAQUENIL) 200 MG tablet    Sig: Take 1 tablet (200 mg total) by mouth daily.    Dispense:  90 tablet    Refill:  0     Follow-Up Instructions: Return in 5 months (on 06/17/2024) for Autoimmune Disease.   Gearldine Bienenstock, PA-C  Note - This record has been created using Dragon software.  Chart creation errors have been sought, but may not always  have been located. Such creation errors do not reflect on  the standard of medical care.

## 2024-01-18 ENCOUNTER — Ambulatory Visit: Payer: 59 | Attending: Physician Assistant | Admitting: Physician Assistant

## 2024-01-18 ENCOUNTER — Encounter: Payer: Self-pay | Admitting: Physician Assistant

## 2024-01-18 VITALS — BP 159/84 | HR 68 | Resp 15 | Ht 63.0 in | Wt 200.4 lb

## 2024-01-18 DIAGNOSIS — Z8679 Personal history of other diseases of the circulatory system: Secondary | ICD-10-CM

## 2024-01-18 DIAGNOSIS — I7300 Raynaud's syndrome without gangrene: Secondary | ICD-10-CM

## 2024-01-18 DIAGNOSIS — Z87898 Personal history of other specified conditions: Secondary | ICD-10-CM

## 2024-01-18 DIAGNOSIS — Z79899 Other long term (current) drug therapy: Secondary | ICD-10-CM

## 2024-01-18 DIAGNOSIS — G5601 Carpal tunnel syndrome, right upper limb: Secondary | ICD-10-CM

## 2024-01-18 DIAGNOSIS — Z8709 Personal history of other diseases of the respiratory system: Secondary | ICD-10-CM

## 2024-01-18 DIAGNOSIS — M359 Systemic involvement of connective tissue, unspecified: Secondary | ICD-10-CM

## 2024-01-18 DIAGNOSIS — Z8669 Personal history of other diseases of the nervous system and sense organs: Secondary | ICD-10-CM

## 2024-01-18 DIAGNOSIS — M25531 Pain in right wrist: Secondary | ICD-10-CM

## 2024-01-18 DIAGNOSIS — Z8639 Personal history of other endocrine, nutritional and metabolic disease: Secondary | ICD-10-CM

## 2024-01-18 DIAGNOSIS — M6788 Other specified disorders of synovium and tendon, other site: Secondary | ICD-10-CM

## 2024-01-18 DIAGNOSIS — Z85820 Personal history of malignant melanoma of skin: Secondary | ICD-10-CM

## 2024-01-18 MED ORDER — HYDROXYCHLOROQUINE SULFATE 200 MG PO TABS: 200.0000 mg | ORAL_TABLET | Freq: Every day | ORAL | 0 refills | Status: DC

## 2024-01-18 NOTE — Patient Instructions (Signed)
Standing Labs We placed an order today for your standing lab work.   Please have your standing labs drawn in March and every 3 months   Please have your labs drawn 2 weeks prior to your appointment so that the provider can discuss your lab results at your appointment, if possible.  Please note that you may see your imaging and lab results in MyChart before we have reviewed them. We will contact you once all results are reviewed. Please allow our office up to 72 hours to thoroughly review all of the results before contacting the office for clarification of your results.  WALK-IN LAB HOURS  Monday through Thursday from 8:00 am -12:30 pm and 1:00 pm-5:00 pm and Friday from 8:00 am-12:00 pm.  Patients with office visits requiring labs will be seen before walk-in labs.  You may encounter longer than normal wait times. Please allow additional time. Wait times may be shorter on  Monday and Thursday afternoons.  We do not book appointments for walk-in labs. We appreciate your patience and understanding with our staff.   Labs are drawn by [facility]. Please bring your co-pay at the time of your lab draw.  You may receive a bill from [facility] for your lab work.  Please note if you are on Hydroxychloroquine and and an order has been placed for a Hydroxychloroquine level,  you will need to have it drawn 4 hours or more after your last dose.  If you wish to have your labs drawn at another location, please call the office 24 hours in advance so we can fax the orders.  The office is located at [address], [address], [place], [place] [address]   If you have any questions regarding directions or hours of operation,  please call [phone].   As a reminder, please drink plenty of water prior to coming for your lab work. Thanks!

## 2024-02-23 ENCOUNTER — Other Ambulatory Visit: Payer: Self-pay | Admitting: *Deleted

## 2024-02-23 DIAGNOSIS — Z79899 Other long term (current) drug therapy: Secondary | ICD-10-CM

## 2024-02-23 DIAGNOSIS — M359 Systemic involvement of connective tissue, unspecified: Secondary | ICD-10-CM

## 2024-02-25 LAB — SEDIMENTATION RATE: Sed Rate: 6 mm/h (ref 0–30)

## 2024-02-25 LAB — ANA: Anti Nuclear Antibody (ANA): POSITIVE — AB

## 2024-02-25 LAB — PROTEIN / CREATININE RATIO, URINE
Creatinine, Urine: 89 mg/dL (ref 20–275)
Protein/Creat Ratio: 56 mg/g{creat} (ref 24–184)
Protein/Creatinine Ratio: 0.056 mg/mg{creat} (ref 0.024–0.184)
Total Protein, Urine: 5 mg/dL (ref 5–24)

## 2024-02-25 LAB — CBC WITH DIFFERENTIAL/PLATELET
Absolute Lymphocytes: 3123 {cells}/uL (ref 850–3900)
Absolute Monocytes: 592 {cells}/uL (ref 200–950)
Basophils Absolute: 61 {cells}/uL (ref 0–200)
Basophils Relative: 0.7 %
Eosinophils Absolute: 218 {cells}/uL (ref 15–500)
Eosinophils Relative: 2.5 %
HCT: 42.7 % (ref 35.0–45.0)
Hemoglobin: 14.3 g/dL (ref 11.7–15.5)
MCH: 30.6 pg (ref 27.0–33.0)
MCHC: 33.5 g/dL (ref 32.0–36.0)
MCV: 91.2 fL (ref 80.0–100.0)
MPV: 9.4 fL (ref 7.5–12.5)
Monocytes Relative: 6.8 %
Neutro Abs: 4707 {cells}/uL (ref 1500–7800)
Neutrophils Relative %: 54.1 %
Platelets: 339 10*3/uL (ref 140–400)
RBC: 4.68 10*6/uL (ref 3.80–5.10)
RDW: 13.4 % (ref 11.0–15.0)
Total Lymphocyte: 35.9 %
WBC: 8.7 10*3/uL (ref 3.8–10.8)

## 2024-02-25 LAB — COMPLETE METABOLIC PANEL WITH GFR
AG Ratio: 1.6 (calc) (ref 1.0–2.5)
ALT: 15 U/L (ref 6–29)
AST: 25 U/L (ref 10–35)
Albumin: 3.9 g/dL (ref 3.6–5.1)
Alkaline phosphatase (APISO): 89 U/L (ref 37–153)
BUN: 16 mg/dL (ref 7–25)
CO2: 30 mmol/L (ref 20–32)
Calcium: 9.2 mg/dL (ref 8.6–10.4)
Chloride: 106 mmol/L (ref 98–110)
Creat: 0.85 mg/dL (ref 0.50–1.05)
Globulin: 2.4 g/dL (ref 1.9–3.7)
Glucose, Bld: 93 mg/dL (ref 65–99)
Potassium: 4.1 mmol/L (ref 3.5–5.3)
Sodium: 144 mmol/L (ref 135–146)
Total Bilirubin: 0.3 mg/dL (ref 0.2–1.2)
Total Protein: 6.3 g/dL (ref 6.1–8.1)
eGFR: 76 mL/min/{1.73_m2} (ref >=60)

## 2024-02-25 LAB — ANTI-NUCLEAR AB-TITER (ANA TITER): ANA Titer 1: 1:40 {titer} — ABNORMAL HIGH

## 2024-02-25 LAB — C3 AND C4
C3 Complement: 148 mg/dL (ref 83–193)
C4 Complement: 22 mg/dL (ref 15–57)

## 2024-02-25 LAB — ANTI-DNA ANTIBODY, DOUBLE-STRANDED: ds DNA Ab: 1 [IU]/mL

## 2024-02-26 NOTE — Progress Notes (Signed)
 ANA remains positive  CBC and CMP WNL  ESR WNL dsDNA negative  Complements WNL Urine protein creatinine ratio WNL  Labs are not consistent with a flare.

## 2024-03-05 ENCOUNTER — Other Ambulatory Visit: Payer: Self-pay | Admitting: Physician Assistant

## 2024-03-05 NOTE — Telephone Encounter (Signed)
 Last Fill: 10/09/2023 Zofran, 12/15/2023 MTX  Labs: 02/23/2024 ANA remains positive CBC and CMP WNL ESR WNL dsDNA negative Complements WNL Urine protein creatinine ratio WNL Labs are not consistent with a flare.  Next Visit: 06/17/2024  Last Visit: 01/18/2024  DX: Autoimmune disease   Current Dose per office note 01/18/2024: Methotrexate 5 tablets by mouth once a week and Zofran not mentioned  Okay to refill Methotrexate and Zofran?

## 2024-03-06 ENCOUNTER — Other Ambulatory Visit: Payer: Self-pay | Admitting: Physician Assistant

## 2024-03-06 NOTE — Telephone Encounter (Signed)
 Last Fill: 03/27/2023  Next Visit: 06/17/2024  Last Visit: 01/18/2024  Dx: Autoimmune disease   Current Dose per office note on 01/18/2024: folic acid 1 mg daily   Okay to refill Folic Acid?

## 2024-03-12 ENCOUNTER — Other Ambulatory Visit: Payer: Self-pay | Admitting: Internal Medicine

## 2024-03-18 ENCOUNTER — Encounter: Payer: Self-pay | Admitting: Rheumatology

## 2024-03-18 ENCOUNTER — Encounter: Payer: Self-pay | Admitting: Internal Medicine

## 2024-03-19 ENCOUNTER — Encounter: Payer: Self-pay | Admitting: *Deleted

## 2024-03-19 MED ORDER — CLOTRIMAZOLE-BETAMETHASONE 1-0.05 % EX CREA: TOPICAL_CREAM | Freq: Two times a day (BID) | CUTANEOUS | 99 refills | Status: AC

## 2024-03-19 NOTE — Telephone Encounter (Signed)
Lotrisone refill sent to pharmacy

## 2024-03-23 ENCOUNTER — Encounter: Payer: Self-pay | Admitting: Internal Medicine

## 2024-03-23 ENCOUNTER — Telehealth: Admitting: Family Medicine

## 2024-03-23 DIAGNOSIS — J40 Bronchitis, not specified as acute or chronic: Secondary | ICD-10-CM | POA: Diagnosis not present

## 2024-03-23 MED ORDER — BENZONATATE 100 MG PO CAPS: 100.0000 mg | ORAL_CAPSULE | Freq: Three times a day (TID) | ORAL | 0 refills | Status: AC | PRN

## 2024-03-23 MED ORDER — DOXYCYCLINE HYCLATE 100 MG PO TABS: 100.0000 mg | ORAL_TABLET | Freq: Two times a day (BID) | ORAL | 0 refills | Status: AC

## 2024-03-23 MED ORDER — PREDNISONE 10 MG (21) PO TBPK: ORAL_TABLET | ORAL | 0 refills | Status: DC

## 2024-03-23 NOTE — Progress Notes (Signed)
 Virtual Visit Consent   Misty Curtis, you are scheduled for a virtual visit with a [facility] provider today. Just as with appointments in the office, your consent must be obtained to participate. Your consent will be active for this visit and any virtual visit you may have with one of our providers in the next 365 days. If you have a MyChart account, a copy of this consent can be sent to you electronically.  As this is a virtual visit, video technology does not allow for your provider to perform a traditional examination. This may limit your provider's ability to fully assess your condition. If your provider identifies any concerns that need to be evaluated in person or the need to arrange testing (such as labs, EKG, etc.), we will make arrangements to do so. Although advances in technology are sophisticated, we cannot ensure that it will always work on either your end or our end. If the connection with a video visit is poor, the visit may have to be switched to a telephone visit. With either a video or telephone visit, we are not always able to ensure that we have a secure connection.  By engaging in this virtual visit, you consent to the provision of healthcare and authorize for your insurance to be billed (if applicable) for the services provided during this visit. Depending on your insurance coverage, you may receive a charge related to this service.  I need to obtain your verbal consent now. Are you willing to proceed with your visit today? [name] has provided verbal consent on 03/23/2024 for a virtual visit (video or telephone). [name], [place]  Date: 03/23/2024 11:30 AM   Virtual Visit via Video Note   IReed Pandy, connected with  Ellee Wawrzyniak  ([id number], 10-09-1961) on 03/23/24 at 11:15 AM EDT by a video-enabled telemedicine application and verified that I am speaking with the correct person using two identifiers.  Location: Patient: Virtual Visit Location Patient: Home Provider:  Virtual Visit Location Provider: Home Office   I discussed the limitations of evaluation and management by telemedicine and the availability of in person appointments. The patient expressed understanding and agreed to proceed.    History of Present Illness: Misty Curtis is a 63 y.o. who identifies as a female who was assigned female at birth, and is being seen today for c/o having a sinus infection that went bad.  Pt states now it its in her chest and has fever of 101.7.  Pt states coughing up nasty stuff that is milky white or yellow.  Pt states symptoms on going since Thursday.  Pt states taking Robitussin, sudafed-D and that has not worked. Pt states she has chills, N/V, headache and post nasal drip that causes the vomiting. Pt states she is able to take prednisone and has no allergies to medication.   HPI: HPI  Problems:  Patient Active Problem List   Diagnosis Date Noted   Hypersomnia 04/18/2023   Heel pain, chronic, left 09/30/2020   Achilles tendinitis, left leg 08/19/2020   Heel pain, chronic, right 05/02/2019   Thrush 06/11/2018   Acquired hypothyroidism 12/08/2016   Autoimmune disease (HCC) 12/06/2016   Other fatigue 12/06/2016   Raynaud's disease without gangrene 12/06/2016   High risk medication use 12/06/2016   Allergic asthma, mild intermittent, uncomplicated 12/06/2016   History of hypertension 12/06/2016   Excessive daytime sleepiness 07/26/2016   Snoring 07/26/2016   Laryngitis 11/10/2012   Postnasal drip 05/02/2012   Acute sinusitis, unspecified 10/24/2011  HYPERTENSION 12/14/2009   BRONCHITIS 12/14/2009   LUPUS 12/14/2009   History of melanoma 12/14/2009   Seasonal and perennial allergic rhinitis 12/14/2009    Allergies:  Allergies  Allergen Reactions   Latex Other (See Comments) and Rash   Molds & Smuts Rash and Shortness Of Breath    Other Reaction(s): Bronchospasm, Cough, Respiratory Distress   Capsicum Annuum Extract & Derivative (Bell Pepper) [Capsicum]  Swelling    Green peppers   Cat Dander     Other Reaction(s): Other (See Comments)   Lactose Intolerance (Gi) Diarrhea   Milk (Cow) Hives and Itching    Other Reaction(s): Diarrhea, GI Intolerance   Milk-Related Compounds Diarrhea   Modafinil Hypertension   Neomycin-Bacitracin Zn-Polymyx     REACTION: rash   Other     Pollen and mold.    Pneumococcal Vaccine Swelling and Other (See Comments)    Redness and warmth to arm   Strawberry Extract    Sulfa Antibiotics     Other Reaction(s): GI Intolerance   Wixela Inhub [Fluticasone-Salmeterol]     Per patient - caused throat to close and laryngitis   Bacitracin-Polymyxin B Rash   Onion Hives, Itching, Rash and Swelling    Also allergic to peppers and hot peppers and dairy  Other Reaction(s): Diarrhea, GI Intolerance  Also allergic to peppers and hot peppers, , Also allergic to peppers and hot peppers and dairy   Pollen Extract Swelling and Rash    Other Reaction(s): Bronchospasm, Respiratory Distress   Medications:  Current Outpatient Medications:    benzonatate (TESSALON) 100 MG capsule, Take 1 capsule (100 mg total) by mouth 3 (three) times daily as needed for up to 7 days., Disp: 21 capsule, Rfl: 0   doxycycline (VIBRA-TABS) 100 MG tablet, Take 1 tablet (100 mg total) by mouth 2 (two) times daily for 7 days., Disp: 14 tablet, Rfl: 0   predniSONE (STERAPRED UNI-PAK 21 TAB) 10 MG (21) TBPK tablet, Take following package directions., Disp: 21 tablet, Rfl: 0   albuterol (VENTOLIN HFA) 108 (90 Base) MCG/ACT inhaler, Inhale 2 puffs into the lungs every 6 (six) hours as needed for wheezing or shortness of breath., Disp: 1 each, Rfl: 12   Armodafinil 150 MG tablet, Take 1 tablet (150 mg total) by mouth in the morning., Disp: 30 tablet, Rfl: 5   Ascorbic Acid (VITAMIN C) 500 MG tablet, Take 500 mg by mouth daily., Disp: , Rfl:    B Complex Vitamins (VITAMIN B COMPLEX PO), Take by mouth., Disp: , Rfl:    BIOTENE DRY MOUTH (BIOTENE) LIQD,  Place 1 application onto teeth as needed., Disp: , Rfl:    budesonide-formoterol (SYMBICORT) 80-4.5 MCG/ACT inhaler, INHALE 2 PUFFS INTO THE LUNGS IN THE MORNING AND AT BEDTIME, Disp: 10.2 g, Rfl: 9   Cholecalciferol (VITAMIN D) 2000 units CAPS, Take by mouth., Disp: , Rfl:    clotrimazole-betamethasone (LOTRISONE) cream, Apply topically 2 (two) times daily., Disp: 30 g, Rfl: prn   diclofenac Sodium (VOLTAREN) 1 % GEL, Apply 2-4 grams to affected joint 4 times daily as needed., Disp: 400 g, Rfl: 2   Digestive Enzymes (DIGESTIVE ENZYME PO), Take by mouth., Disp: , Rfl:    fluticasone (FLONASE) 50 MCG/ACT nasal spray, SHAKE LIQUID AND USE 2 SPRAYS IN EACH NOSTRIL DAILY, Disp: 16 g, Rfl: 12   folic acid (FOLVITE) 1 MG tablet, TAKE 1 TABLET(1 MG) BY MOUTH DAILY, Disp: 90 tablet, Rfl: 3   hydroxychloroquine (PLAQUENIL) 200 MG tablet, Take 1 tablet (200  mg total) by mouth daily., Disp: 90 tablet, Rfl: 0   lactase (LACTAID) 3000 UNITS tablet, Take by mouth as needed. , Disp: , Rfl:    lisinopril (ZESTRIL) 20 MG tablet, Take 10 mg by mouth every morning., Disp: , Rfl:    loratadine (CLARITIN) 10 MG tablet, Take 10 mg by mouth daily., Disp: , Rfl:    methotrexate (RHEUMATREX) 2.5 MG tablet, TAKE 5 TABLETS BY MOUTH 1 TIME WEEKLY, Disp: 60 tablet, Rfl: 0   MISC NATURAL PRODUCTS PO, Take by mouth. Flex Guard (Patient not taking: Reported on 01/18/2024), Disp: , Rfl:    montelukast (SINGULAIR) 10 MG tablet, Take 1 tablet (10 mg total) by mouth daily., Disp: 30 tablet, Rfl: 6   mupirocin ointment (BACTROBAN) 2 %, APPLY EXTERNALLY TO THE AFFECTED AREA TWICE DAILY AS DIRECTED, Disp: 22 g, Rfl: 12   NON FORMULARY, Doterra Essential Oils:  Eucalyptus, Wintergreen, Lemongrass, Frankincense, [place], On [employer], Disp: , Rfl:    ondansetron (ZOFRAN) 4 MG tablet, TAKE 1 TABLET(4 MG) BY MOUTH EVERY 8 HOURS AS NEEDED FOR NAUSEA OR VOMITING, Disp: 30 tablet, Rfl: 0   Spacer/Aero-Holding Chambers DEVI, USE AS DIRECTED, Disp:  1 each, Rfl: 0   Specialty Vitamins Products (CENTRUM PERFORMANCE) TABS, Take 1 tablet by mouth daily., Disp: , Rfl:    SYNTHROID 75 MCG tablet, Take 75 mcg by mouth every morning., Disp: , Rfl:   Observations/Objective: Patient is well-developed, well-nourished in no acute distress.  Resting comfortably at home.  Head is normocephalic, atraumatic.  No labored breathing.  Speech is clear and coherent with logical content.  Patient is alert and oriented at baseline.    Assessment and Plan: 1. BRONCHITIS (Primary) - doxycycline (VIBRA-TABS) 100 MG tablet; Take 1 tablet (100 mg total) by mouth 2 (two) times daily for 7 days.  Dispense: 14 tablet; Refill: 0 - predniSONE (STERAPRED UNI-PAK 21 TAB) 10 MG (21) TBPK tablet; Take following package directions.  Dispense: 21 tablet; Refill: 0 - benzonatate (TESSALON) 100 MG capsule; Take 1 capsule (100 mg total) by mouth 3 (three) times daily as needed for up to 7 days.  Dispense: 21 capsule; Refill: 0  -Will treat for bronchitis -Advised Pt to follow up with in person urgent care or PCP if worsening symptoms.   Follow Up Instructions: I discussed the assessment and treatment plan with the patient. The patient was provided an opportunity to ask questions and all were answered. The patient agreed with the plan and demonstrated an understanding of the instructions.  A copy of instructions were sent to the patient via MyChart unless otherwise noted below.    The patient was advised to call back or seek an in-person evaluation if the symptoms worsen or if the condition fails to improve as anticipated.    [name], PA-C

## 2024-03-23 NOTE — Patient Instructions (Signed)
 Misty Curtis, thank you for joining Misty Pandy, PA-C for today's virtual visit.  While this provider is not your primary care provider (PCP), if your PCP is located in our provider database this encounter information will be shared with them immediately following your visit.   A [facility] MyChart account gives you access to today's visit and all your visits, tests, and labs performed at [facility] " click here if you don't have a [name] MyChart account or go to [email or web address]  Consent: (Patient) Misty Curtis provided verbal consent for this virtual visit at the beginning of the encounter.  Current Medications:  Current Outpatient Medications:    benzonatate (TESSALON) 100 MG capsule, Take 1 capsule (100 mg total) by mouth 3 (three) times daily as needed for up to 7 days., Disp: 21 capsule, Rfl: 0   doxycycline (VIBRA-TABS) 100 MG tablet, Take 1 tablet (100 mg total) by mouth 2 (two) times daily for 7 days., Disp: 14 tablet, Rfl: 0   predniSONE (STERAPRED UNI-PAK 21 TAB) 10 MG (21) TBPK tablet, Take following package directions., Disp: 21 tablet, Rfl: 0   albuterol (VENTOLIN HFA) 108 (90 Base) MCG/ACT inhaler, Inhale 2 puffs into the lungs every 6 (six) hours as needed for wheezing or shortness of breath., Disp: 1 each, Rfl: 12   Armodafinil 150 MG tablet, Take 1 tablet (150 mg total) by mouth in the morning., Disp: 30 tablet, Rfl: 5   Ascorbic Acid (VITAMIN C) 500 MG tablet, Take 500 mg by mouth daily., Disp: , Rfl:    B Complex Vitamins (VITAMIN B COMPLEX PO), Take by mouth., Disp: , Rfl:    BIOTENE DRY MOUTH (BIOTENE) LIQD, Place 1 application onto teeth as needed., Disp: , Rfl:    budesonide-formoterol (SYMBICORT) 80-4.5 MCG/ACT inhaler, INHALE 2 PUFFS INTO THE LUNGS IN THE MORNING AND AT BEDTIME, Disp: 10.2 g, Rfl: 9   Cholecalciferol (VITAMIN D) 2000 units CAPS, Take by mouth., Disp: , Rfl:    clotrimazole-betamethasone (LOTRISONE) cream, Apply topically 2  (two) times daily., Disp: 30 g, Rfl: prn   diclofenac Sodium (VOLTAREN) 1 % GEL, Apply 2-4 grams to affected joint 4 times daily as needed., Disp: 400 g, Rfl: 2   Digestive Enzymes (DIGESTIVE ENZYME PO), Take by mouth., Disp: , Rfl:    fluticasone (FLONASE) 50 MCG/ACT nasal spray, SHAKE LIQUID AND USE 2 SPRAYS IN EACH NOSTRIL DAILY, Disp: 16 g, Rfl: 12   folic acid (FOLVITE) 1 MG tablet, TAKE 1 TABLET(1 MG) BY MOUTH DAILY, Disp: 90 tablet, Rfl: 3   hydroxychloroquine (PLAQUENIL) 200 MG tablet, Take 1 tablet (200 mg total) by mouth daily., Disp: 90 tablet, Rfl: 0   lactase (LACTAID) 3000 UNITS tablet, Take by mouth as needed. , Disp: , Rfl:    lisinopril (ZESTRIL) 20 MG tablet, Take 10 mg by mouth every morning., Disp: , Rfl:    loratadine (CLARITIN) 10 MG tablet, Take 10 mg by mouth daily., Disp: , Rfl:    methotrexate (RHEUMATREX) 2.5 MG tablet, TAKE 5 TABLETS BY MOUTH 1 TIME WEEKLY, Disp: 60 tablet, Rfl: 0   MISC NATURAL PRODUCTS PO, Take by mouth. Flex Guard (Patient not taking: Reported on 01/18/2024), Disp: , Rfl:    montelukast (SINGULAIR) 10 MG tablet, Take 1 tablet (10 mg total) by mouth daily., Disp: 30 tablet, Rfl: 6   mupirocin ointment (BACTROBAN) 2 %, APPLY EXTERNALLY TO THE AFFECTED AREA TWICE DAILY AS DIRECTED, Disp: 22 g, Rfl: 12   NON FORMULARY, Doterra Essential Oils:  Eucalyptus, [place]green, [place], [place], [place], On [employer], Disp: , Rfl:    ondansetron (ZOFRAN) 4 MG tablet, TAKE 1 TABLET(4 MG) BY MOUTH EVERY 8 HOURS AS NEEDED FOR NAUSEA OR VOMITING, Disp: 30 tablet, Rfl: 0   Spacer/Aero-Holding Chambers DEVI, USE AS DIRECTED, Disp: 1 each, Rfl: 0   Specialty Vitamins Products (CENTRUM PERFORMANCE) TABS, Take 1 tablet by mouth daily., Disp: , Rfl:    SYNTHROID 75 MCG tablet, Take 75 mcg by mouth every morning., Disp: , Rfl:    Medications ordered in this encounter:  Meds ordered this encounter  Medications   doxycycline (VIBRA-TABS) 100 MG tablet    Sig: Take 1  tablet (100 mg total) by mouth 2 (two) times daily for 7 days.    Dispense:  14 tablet    Refill:  0   predniSONE (STERAPRED UNI-PAK 21 TAB) 10 MG (21) TBPK tablet    Sig: Take following package directions.    Dispense:  21 tablet    Refill:  0    Please dispense one standard blister pack taper.   benzonatate (TESSALON) 100 MG capsule    Sig: Take 1 capsule (100 mg total) by mouth 3 (three) times daily as needed for up to 7 days.    Dispense:  21 capsule    Refill:  0     *If you need refills on other medications prior to your next appointment, please contact your pharmacy*  Follow-Up: Call back or seek an in-person evaluation if the symptoms worsen or if the condition fails to improve as anticipated.  [facility] [phone]  Other Instructions Acute Bronchitis, Adult  Acute bronchitis is sudden inflammation of the main airways (bronchi) that come off the windpipe (trachea) in the lungs. The swelling causes the airways to get smaller and make more mucus than normal. This can make it hard to breathe and can cause coughing or noisy breathing (wheezing). Acute bronchitis may last several weeks. The cough may last longer. Allergies, asthma, and exposure to smoke may make the condition worse. What are the causes? This condition can be caused by germs and by substances that irritate the lungs, including: Cold and flu viruses. The most common cause of this condition is the virus that causes the common cold. Bacteria. This is less common. Breathing in substances that irritate the lungs, including: Smoke from cigarettes and other forms of tobacco. Dust and pollen. Fumes from household cleaning products, gases, or burned fuel. Indoor or outdoor air pollution. What increases the risk? The following factors may make you more likely to develop this condition: A weak body's defense system, also called the immune system. A condition that affects your lungs and breathing, such  as asthma. What are the signs or symptoms? Common symptoms of this condition include: Coughing. This may bring up clear, yellow, or green mucus from your lungs (sputum). Wheezing. Runny or stuffy nose. Having too much mucus in your lungs (chest congestion). Shortness of breath. Aches and pains, including sore throat or chest. How is this diagnosed? This condition is usually diagnosed based on: Your symptoms and medical history. A physical exam. You may also have other tests, including tests to rule out other conditions, such as pneumonia. These tests include: A test of lung function. Test of a mucus sample to look for the presence of bacteria. Tests to check the oxygen level in your blood. Blood tests. Chest X-ray. How is this treated? Most cases of acute bronchitis clear up over time without  treatment. Your health care provider may recommend: Drinking more fluids to help thin your mucus so it is easier to cough up. Taking inhaled medicine (inhaler) to improve air flow in and out of your lungs. Using a vaporizer or a humidifier. These are machines that add water to the air to help you breathe better. Taking a medicine that thins mucus and clears congestion (expectorant). Taking a medicine that prevents or stops coughing (cough suppressant). It is not common to take an antibiotic medicine for this condition. Follow these instructions at home:  Take over-the-counter and prescription medicines only as told by your health care provider. Use an inhaler, vaporizer, or humidifier as told by your health care provider. Take two teaspoons (10 mL) of honey at bedtime to lessen coughing at night. Drink enough fluid to keep your urine pale yellow. Do not use any products that contain nicotine or tobacco. These products include cigarettes, chewing tobacco, and vaping devices, such as e-cigarettes. If you need help quitting, ask your health care provider. Get plenty of rest. Return to your normal  activities as told by your health care provider. Ask your health care provider what activities are safe for you. Keep all follow-up visits. This is important. How is this prevented? To lower your risk of getting this condition again: Wash your hands often with soap and water for at least 20 seconds. If soap and water are not available, use hand sanitizer. Avoid contact with people who have cold symptoms. Try not to touch your mouth, nose, or eyes with your hands. Avoid breathing in smoke or chemical fumes. Breathing smoke or chemical fumes will make your condition worse. Get the flu shot every year. Contact a health care provider if: Your symptoms do not improve after 2 weeks. You have trouble coughing up the mucus. Your cough keeps you awake at night. You have a fever. Get help right away if you: Cough up blood. Feel pain in your chest. Have severe shortness of breath. Faint or keep feeling like you are going to faint. Have a severe headache. Have a fever or chills that get worse. These symptoms may represent a serious problem that is an emergency. Do not wait to see if the symptoms will go away. Get medical help right away. Call your local emergency services (911 in the [place] Do not drive yourself to the hospital. Summary Acute bronchitis is inflammation of the main airways (bronchi) that come off the windpipe (trachea) in the lungs. The swelling causes the airways to get smaller and make more mucus than normal. Drinking more fluids can help thin your mucus so it is easier to cough up. Take over-the-counter and prescription medicines only as told by your health care provider. Do not use any products that contain nicotine or tobacco. These products include cigarettes, chewing tobacco, and vaping devices, such as e-cigarettes. If you need help quitting, ask your health care provider. Contact a health care provider if your symptoms do not improve after 2 weeks. This information is not  intended to replace advice given to you by your health care provider. Make sure you discuss any questions you have with your health care provider. Document Revised: 03/17/2022 Document Reviewed: 04/07/2021 Elsevier Patient Education  2024 [employer]vier [employer].   If you have been instructed to have an in-person evaluation today at a local Urgent Care facility, please use the link below. It will take you to a list of all of our available [facility], including address, phone number and  hours of operation. Please do not delay care.  [facility]  If you or a family member do not have a primary care provider, use the link below to schedule a visit and establish care. When you choose a [facility] primary care physician or advanced practice provider, you gain a long-term partner in health. Find a Primary Care Provider  Learn more about [facility]'s in-office and virtual care options: [facility] - Get Care Now

## 2024-05-24 NOTE — Progress Notes (Signed)
 Office Visit Note  Patient: Misty Curtis             Date of Birth: [birth year]           MRN: [id number]             PCP: Jim Motts, NP (Inactive) Referring: No ref. provider found Visit Date: 06/07/2024 Occupation: @GUAROCC @  Subjective:  Medication monitoring   History of Present Illness: Misty Curtis is a 63 y.o. female with history of autoimmune disease.  Patient is taking methotrexate  5 tablets by mouth once weekly, folic acid  1 mg daily, and Plaquenil  200 mg 1 tablet by mouth daily.  She is tolerating combination therapy without any side effects and has not had any recent gaps in therapy.  She denies any signs or symptoms of a flare.  She continues to experience intermittent arthralgias and joint stiffness.  She has had some increased discomfort in her left hip since she has increased her walking regimen.  She is also had some increased discomfort in the left first MTP joint.  Patient has had a recurrence of carpal tunnel symptoms involving the right hand which she attributes to crafting and gardening more than usual since retiring. Patient continues to have chronic sicca symptoms.  She has been using Biotene twice a day for symptomatic relief.  She continues to see the dentist every 6 months and had a dental cleaning this week.  She has also been seeing ophthalmology on a regular basis.  She denies any oral or nasal ulcerations.   Activities of Daily Living:  Patient reports morning stiffness for 30 minutes.   Patient Reports nocturnal pain.  Difficulty dressing/grooming: Denies Difficulty climbing stairs: Denies Difficulty getting out of chair: Denies Difficulty using hands for taps, buttons, cutlery, and/or writing: Reports  Review of Systems  Constitutional:  Positive for fatigue.  HENT:  Positive for mouth dryness. Negative for mouth sores.   Eyes:  Positive for dryness.  Respiratory:  Negative for shortness of breath.   Cardiovascular:  Negative for chest pain and  palpitations.  Gastrointestinal:  Negative for blood in stool, constipation and diarrhea.  Endocrine: Negative for increased urination.  Genitourinary:  Negative for involuntary urination.  Musculoskeletal:  Positive for joint pain, joint pain, joint swelling, myalgias, morning stiffness, muscle tenderness and myalgias. Negative for gait problem and muscle weakness.  Skin:  Positive for hair loss and sensitivity to sunlight. Negative for color change and rash.  Allergic/Immunologic: Positive for susceptible to infections.  Neurological:  Negative for dizziness and headaches.  Hematological:  Negative for swollen glands.  Psychiatric/Behavioral:  Positive for sleep disturbance. Negative for depressed mood. The patient is not nervous/anxious.     PMFS History:  Patient Active Problem List   Diagnosis Date Noted   Hypersomnia 04/18/2023   Heel pain, chronic, left 09/30/2020   Achilles tendinitis, left leg 08/19/2020   Heel pain, chronic, right 05/02/2019   Thrush 06/11/2018   Acquired hypothyroidism 12/08/2016   Autoimmune disease (HCC) 12/06/2016   Other fatigue 12/06/2016   Raynaud's disease without gangrene 12/06/2016   High risk medication use 12/06/2016   Allergic asthma, mild intermittent, uncomplicated 12/06/2016   History of hypertension 12/06/2016   Excessive daytime sleepiness 07/26/2016   Snoring 07/26/2016   Laryngitis 11/10/2012   Postnasal drip 05/02/2012   Acute sinusitis, unspecified 10/24/2011   HYPERTENSION 12/14/2009   BRONCHITIS 12/14/2009   LUPUS 12/14/2009   History of melanoma 12/14/2009   Seasonal and perennial allergic rhinitis 12/14/2009  Past Medical History:  Diagnosis Date   Allergic rhinitis    skin test 01-18-10   Bronchitis    Cataract    left eye, per patient   Lupus (systemic lupus erythematosus) (HCC)    joints and skin rash. Dr. Alvira Josephs   Melanoma [facility])    Rhinosinusitis    Skin cancer     Family History  Problem Relation Age of  Onset   Stroke Mother    Allergies Father    Heart disease Father        bacterial endocarditis   Hypothyroidism Sister    Hypothyroidism Sister    Hashimoto's thyroiditis Sister    Cancer Maternal Grandmother    Hepatitis Son    Past Surgical History:  Procedure Laterality Date   ABDOMINAL HYSTERECTOMY     ACHILLES TENDON REPAIR Bilateral    Dr. Aviva Lemmings    BREAST LUMPECTOMY Left    08/21/2019, 09/20/2019   KNEE ARTHROPLASTY     skin cancer extraction     skin cancer extraction  2023   right side of face   Social History   Social History Narrative   Not on file   Immunization History  Administered Date(s) Administered   Influenza Split 09/19/2011, 09/12/2012, 09/02/2013   Influenza-Unspecified 09/19/2011, 09/12/2012, 09/02/2013, 10/19/2014   Moderna Covid-19 Fall Seasonal Vaccine 17yrs & older 12/01/2022   Moderna Sars-Covid-2 Vaccination 02/26/2020, 03/25/2020, 12/09/2020   Pneumococcal Polysaccharide-23 08/19/2010   Pneumococcal-Unspecified 07/01/2020     Objective: Vital Signs: BP (!) 170/77 (BP Location: Right Arm, Patient Position: Sitting, Cuff Size: Normal)   Pulse 75   Resp 15   Ht 5' 3 (1.6 m)   Wt 198 lb (89.8 kg)   BMI 35.07 kg/m    Physical Exam Vitals and nursing note reviewed.  Constitutional:      Appearance: She is well-developed.  HENT:     Head: Normocephalic and atraumatic.     Mouth/Throat:     Comments: No parotid swelling   Eyes:     Conjunctiva/sclera: Conjunctivae normal.    Cardiovascular:     Rate and Rhythm: Normal rate and regular rhythm.     Heart sounds: Normal heart sounds.  Pulmonary:     Effort: Pulmonary effort is normal.     Breath sounds: Normal breath sounds.  Abdominal:     General: Bowel sounds are normal.     Palpations: Abdomen is soft.   Musculoskeletal:     Cervical back: Normal range of motion.  Lymphadenopathy:     Cervical: No cervical adenopathy.   Skin:    General: Skin is warm and dry.      Capillary Refill: Capillary refill takes less than 2 seconds.     Comments: No malar rash noted    Neurological:     Mental Status: She is alert and oriented to person, place, and time.   Psychiatric:        Behavior: Behavior normal.      Musculoskeletal Exam: C-spine, thoracic spine, and lumbar spine good ROM.  Thoracic kyphosis noted.  Shoulder joints, elbow joints, wrist joints, Mcps, PIPs, and DIPs good ROM with no synovitis.  Complete fist formation bilaterally.  PIP and DIP thickening consistent with osteoarthritis of both hands.  Tenderness of bilateral second MCP joints.  Hip joints have good ROM with no groin pain.  Knee joints have good ROM with no warmth or effusion.  Ankle joints have good ROM with no tenderness or joint swelling.   CDAI  Exam: CDAI Score: -- Patient Global: --; Provider Global: -- Swollen: --; Tender: -- Joint Exam 06/07/2024   No joint exam has been documented for this visit   There is currently no information documented on the homunculus. Go to the Rheumatology activity and complete the homunculus joint exam.  Investigation: No additional findings.  Imaging: No results found.  Recent Labs: Lab Results  Component Value Date   WBC 8.5 06/05/2024   HGB 14.9 06/05/2024   PLT 351 06/05/2024   NA 140 06/05/2024   K 3.8 06/05/2024   CL 104 06/05/2024   CO2 29 06/05/2024   GLUCOSE 100 (H) 06/05/2024   BUN 16 06/05/2024   CREATININE 0.93 06/05/2024   BILITOT 0.4 06/05/2024   ALKPHOS 82 06/02/2017   AST 35 06/05/2024   ALT 21 06/05/2024   PROT 6.9 06/05/2024   ALBUMIN 4.1 06/02/2017   CALCIUM 9.9 06/05/2024   GFRAA 70 04/12/2021    Speciality Comments: PLQ eye exam: 06/26/2023 WNL at Dr Wardell Guys. Bambi Bonine, OD Follow up in 1 year  Procedures:  No procedures performed Allergies: Latex, Molds & smuts, Capsicum annuum extract & derivative (bell pepper) [capsicum], Cat dander, Lactose intolerance (gi), Milk (cow), Milk-related compounds, Modafinil ,  Neomycin-bacitracin zn-polymyx, Other, Pneumococcal vaccine, Strawberry extract, Sulfa antibiotics, Wixela inhub [fluticasone -salmeterol], Bacitracin-polymyxin b, Onion, and Pollen extract   Assessment / Plan:     Visit Diagnoses: Autoimmune disease (HCC) - History of positive ANA, DS DNA, Raynauds, fatigue, oral ulcers, nasal ulcers, arthralgias, sicca: She has not had any signs or symptoms of an autoimmune disease flare.  She has clinically been doing well taking Plaquenil  200 mg 1 tablet by mouth daily, methotrexate  5 tablets by mouth once weekly, and folic acid  1 mg daily.  She is tolerating combination therapy without any side effects and has not had any recent gaps in therapy.  She continues to have chronic sicca symptoms and has been using over-the-counter products for symptomatic relief.  She continues to see the dentist every 6 months and has been seeing ophthalmologist on a yearly basis.  She has not had any oral or nasal ulcerations.  No Malar rash noted.  No cervical lymphadenopathy.  No oral or nasal ulcerations.  She has no synovitis on examination today.  She experiences intermittent arthralgias and joint stiffness particularly with repetitive or overuse activities.  She has been increasing her walking regimen as well as gardening and crafting since retiring.  Lab work from 06/05/2024 was reviewed today in the office: ESR within normal limits, double-stranded ENA negative, complements within normal limits, no proteinuria, and ANA pending--labs are not consistent with a flare at this time.   She will remain on Plaquenil  and methotrexate  as combination therapy.  She was advised to notify us  if she develops signs or symptoms of a flare.  She will follow-up in the office in 5 months or sooner if needed.  High risk medication use - Plaquenil  200 mg 1 tablet by mouth daily, Methotrexate  5 tablets by mouth once a week, and folic acid  1 mg daily. PLQ eye exam: 06/26/2023 WNL at Dr Wardell Guys. Bambi Bonine, OD  Follow up in 1 year  CBC and CMP were drawn on 06/05/2024.  Raynaud's disease without gangrene: Intermittent symptoms of Raynaud's phenomena especially involving both feet.  Capillary refill less than 2 seconds.  No signs of sclerodactyly noted.  No digital ulcerations noted.  Right carpal tunnel syndrome: She has occasional discomfort in the right wrist and hand which she attributes to carpal tunnel  syndrome.  Patient has been crafting and gardening more than usual since retiring which has exacerbated her symptoms.  Encourage patient to use a carpal tunnel splint at bedtime.  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  Intermittent discomfort especially if walking for prolonged distances.  Other medical conditions are listed as follows:   History of fatigue: Chronic, stable.  History of hypertension: Blood pressure was elevated today in the office.  Patient was advised to monitor blood pressure closely and return to PCP if her blood pressure mains elevated.  History of melanoma  History of hypothyroidism  History of asthma  History of sleep apnea   Orders: No orders of the defined types were placed in this encounter.  Meds ordered this encounter  Medications   methotrexate  (RHEUMATREX) 2.5 MG tablet    Sig: Take 5 tablets (12.5 mg total) by mouth once a week. Caution:Chemotherapy. Protect from light.    Dispense:  60 tablet    Refill:  0    Follow-Up Instructions: Return in about 5 months (around 11/07/2024) for Autoimmune Disease.   Romayne Clubs, PA-C  Note - This record has been created using Dragon software.  Chart creation errors have been sought, but may not always  have been located. Such creation errors do not reflect on  the standard of medical care.

## 2024-06-04 ENCOUNTER — Other Ambulatory Visit: Payer: Self-pay | Admitting: Physician Assistant

## 2024-06-04 DIAGNOSIS — M359 Systemic involvement of connective tissue, unspecified: Secondary | ICD-10-CM

## 2024-06-04 NOTE — Telephone Encounter (Signed)
 Last Fill: 01/18/2024  Eye exam: 06/26/2023 WNL   Labs: 02/23/2024 CBC and CMP WNL   Next Visit: 06/07/2024  Last Visit: 01/18/2024  WJ:[id number] disease   Current Dose per office note 01/18/2024: Plaquenil  200 mg 1 tablet by mouth daily   Okay to refill Plaquenil ?

## 2024-06-05 ENCOUNTER — Other Ambulatory Visit: Payer: Self-pay | Admitting: *Deleted

## 2024-06-05 DIAGNOSIS — Z79899 Other long term (current) drug therapy: Secondary | ICD-10-CM

## 2024-06-05 DIAGNOSIS — M359 Systemic involvement of connective tissue, unspecified: Secondary | ICD-10-CM

## 2024-06-07 ENCOUNTER — Encounter: Payer: Self-pay | Admitting: Physician Assistant

## 2024-06-07 ENCOUNTER — Ambulatory Visit: Attending: Physician Assistant | Admitting: Physician Assistant

## 2024-06-07 ENCOUNTER — Ambulatory Visit: Payer: Self-pay | Admitting: Rheumatology

## 2024-06-07 VITALS — BP 170/77 | HR 75 | Resp 15 | Ht 63.0 in | Wt 198.0 lb

## 2024-06-07 DIAGNOSIS — Z8639 Personal history of other endocrine, nutritional and metabolic disease: Secondary | ICD-10-CM

## 2024-06-07 DIAGNOSIS — Z79899 Other long term (current) drug therapy: Secondary | ICD-10-CM | POA: Diagnosis not present

## 2024-06-07 DIAGNOSIS — G5601 Carpal tunnel syndrome, right upper limb: Secondary | ICD-10-CM

## 2024-06-07 DIAGNOSIS — Z8679 Personal history of other diseases of the circulatory system: Secondary | ICD-10-CM

## 2024-06-07 DIAGNOSIS — Z85820 Personal history of malignant melanoma of skin: Secondary | ICD-10-CM

## 2024-06-07 DIAGNOSIS — M25531 Pain in right wrist: Secondary | ICD-10-CM | POA: Diagnosis not present

## 2024-06-07 DIAGNOSIS — I7300 Raynaud's syndrome without gangrene: Secondary | ICD-10-CM | POA: Diagnosis not present

## 2024-06-07 DIAGNOSIS — R21 Rash and other nonspecific skin eruption: Secondary | ICD-10-CM

## 2024-06-07 DIAGNOSIS — Z8709 Personal history of other diseases of the respiratory system: Secondary | ICD-10-CM

## 2024-06-07 DIAGNOSIS — M359 Systemic involvement of connective tissue, unspecified: Secondary | ICD-10-CM

## 2024-06-07 DIAGNOSIS — Z8669 Personal history of other diseases of the nervous system and sense organs: Secondary | ICD-10-CM

## 2024-06-07 DIAGNOSIS — Z87898 Personal history of other specified conditions: Secondary | ICD-10-CM

## 2024-06-07 DIAGNOSIS — M6788 Other specified disorders of synovium and tendon, other site: Secondary | ICD-10-CM

## 2024-06-07 LAB — CBC WITH DIFFERENTIAL/PLATELET
Absolute Lymphocytes: 2678 {cells}/uL (ref 850–3900)
Absolute Monocytes: 621 {cells}/uL (ref 200–950)
Basophils Absolute: 60 {cells}/uL (ref 0–200)
Basophils Relative: 0.7 %
Eosinophils Absolute: 170 {cells}/uL (ref 15–500)
Eosinophils Relative: 2 %
HCT: 45.4 % — ABNORMAL HIGH (ref 35.0–45.0)
Hemoglobin: 14.9 g/dL (ref 11.7–15.5)
MCH: 30.3 pg (ref 27.0–33.0)
MCHC: 32.8 g/dL (ref 32.0–36.0)
MCV: 92.3 fL (ref 80.0–100.0)
MPV: 9.6 fL (ref 7.5–12.5)
Monocytes Relative: 7.3 %
Neutro Abs: 4973 {cells}/uL (ref 1500–7800)
Neutrophils Relative %: 58.5 %
Platelets: 351 10*3/uL (ref 140–400)
RBC: 4.92 10*6/uL (ref 3.80–5.10)
RDW: 14.1 % (ref 11.0–15.0)
Total Lymphocyte: 31.5 %
WBC: 8.5 10*3/uL (ref 3.8–10.8)

## 2024-06-07 LAB — ANTI-NUCLEAR AB-TITER (ANA TITER): ANA Titer 1: 1:40 {titer} — ABNORMAL HIGH

## 2024-06-07 LAB — COMPREHENSIVE METABOLIC PANEL WITH GFR
AG Ratio: 1.5 (calc) (ref 1.0–2.5)
ALT: 21 U/L (ref 6–29)
AST: 35 U/L (ref 10–35)
Albumin: 4.1 g/dL (ref 3.6–5.1)
Alkaline phosphatase (APISO): 82 U/L (ref 37–153)
BUN: 16 mg/dL (ref 7–25)
CO2: 29 mmol/L (ref 20–32)
Calcium: 9.9 mg/dL (ref 8.6–10.4)
Chloride: 104 mmol/L (ref 98–110)
Creat: 0.93 mg/dL (ref 0.50–1.05)
Globulin: 2.8 g/dL (ref 1.9–3.7)
Glucose, Bld: 100 mg/dL — ABNORMAL HIGH (ref 65–99)
Potassium: 3.8 mmol/L (ref 3.5–5.3)
Sodium: 140 mmol/L (ref 135–146)
Total Bilirubin: 0.4 mg/dL (ref 0.2–1.2)
Total Protein: 6.9 g/dL (ref 6.1–8.1)
eGFR: 69 mL/min/{1.73_m2} (ref >=60)

## 2024-06-07 LAB — C3 AND C4
C3 Complement: 156 mg/dL (ref 83–193)
C4 Complement: 23 mg/dL (ref 15–57)

## 2024-06-07 LAB — PROTEIN / CREATININE RATIO, URINE
Creatinine, Urine: 59 mg/dL (ref 20–275)
Total Protein, Urine: <4 mg/dL — ABNORMAL LOW (ref 5–24)

## 2024-06-07 LAB — ANA: Anti Nuclear Antibody (ANA): POSITIVE — AB

## 2024-06-07 LAB — ANTI-DNA ANTIBODY, DOUBLE-STRANDED: ds DNA Ab: 1 [IU]/mL

## 2024-06-07 LAB — SEDIMENTATION RATE: Sed Rate: 2 mm/h (ref 0–30)

## 2024-06-07 MED ORDER — METHOTREXATE SODIUM 2.5 MG PO TABS: 12.5000 mg | ORAL_TABLET | ORAL | 0 refills | Status: DC

## 2024-06-07 NOTE — Progress Notes (Signed)
 Urine protein creatinine ratio normal, CBC and CMP are stable, complements normal, double-stranded DNA negative, sed rate normal, ANA pending.  Labs do not indicate an autoimmune disease flare.

## 2024-06-14 ENCOUNTER — Other Ambulatory Visit: Payer: Self-pay | Admitting: Internal Medicine

## 2024-06-17 ENCOUNTER — Ambulatory Visit: Payer: 59 | Admitting: Physician Assistant

## 2024-07-01 ENCOUNTER — Other Ambulatory Visit: Payer: Self-pay | Admitting: Rheumatology

## 2024-08-05 NOTE — Telephone Encounter (Signed)
 Per patient, just notifying us  of vaccination status.

## 2024-08-05 NOTE — Telephone Encounter (Signed)
 Please clarify with the patient but I think she is just notifying us  of vaccination status.

## 2024-08-12 ENCOUNTER — Other Ambulatory Visit: Payer: Self-pay | Admitting: Physician Assistant

## 2024-08-12 ENCOUNTER — Other Ambulatory Visit: Payer: Self-pay | Admitting: Internal Medicine

## 2024-08-12 NOTE — Telephone Encounter (Signed)
 Last Fill: 06/07/2024  Labs: 06/05/2024 Urine protein creatinine ratio normal, CBC and CMP are stable, complements normal, double-stranded DNA negative, sed rate normal, ANA pending. Labs do not indicate an autoimmune disease flare.   Next Visit: 11/19/2024  Last Visit: 06/07/2024  DX: Autoimmune disease   Current Dose per office note 06/07/2024: Methotrexate  5 tablets by mouth once a week   Okay to refill Methotrexate ?

## 2024-08-12 NOTE — Telephone Encounter (Signed)
 Refill received for montelukast  10 mg.  Last OV 08/31/2023.  OK x one refill.  Patient will need yearly OV 08/2024. Per chart note from 08/31/2023: Return in about 1 year (around 08/30/2024).

## 2024-08-14 ENCOUNTER — Other Ambulatory Visit: Payer: Self-pay

## 2024-08-14 ENCOUNTER — Encounter: Payer: Self-pay | Admitting: Neurology

## 2024-08-14 MED ORDER — ARMODAFINIL 150 MG PO TABS: 150.0000 mg | ORAL_TABLET | Freq: Every morning | ORAL | 5 refills | Status: AC

## 2024-08-14 NOTE — Telephone Encounter (Signed)
 Last seen 11/14/23 Next appt 11/26/24 Dispenses   Dispensed Days Supply Quantity Provider Pharmacy  ARMODAFINIL  150MG  TABLETS 06/04/2024 30 30 each Gayland Lauraine PARAS, NP [facility] #...  ARMODAFINIL  150MG  TABLETS 05/06/2024 30 30 each Gayland Lauraine PARAS, NP [facility] #...  ARMODAFINIL  150MG  TABLETS 04/08/2024 30 30 each Gayland Lauraine PARAS, NP [facility] #...  ARMODAFINIL  150MG  TABLETS 03/06/2024 30 30 each Gayland Lauraine PARAS, NP [facility] #...  ARMODAFINIL  150MG  TABLETS 02/05/2024 30 30 each Gayland Lauraine PARAS, NP [facility] #...  ARMODAFINIL  150MG  TABLETS 01/01/2024 30 30 each Gayland Lauraine PARAS, NP [facility] #...  ARMODAFINIL  150MG  TABLETS 11/07/2023 30 30 each Gayland Lauraine PARAS, NP [facility]ORE #...  ARMODAFINIL  150MG  TABLETS 10/08/2023 30 30 each Gayland Lauraine PARAS, NP [facility] STORE #...  ARMODAFINIL  150MG  TABLETS 08/30/2023 30 30 each Gayland Lauraine PARAS, NP [facility] #.[email or web address]

## 2024-08-16 ENCOUNTER — Telehealth: Payer: Self-pay | Admitting: Pharmacist

## 2024-08-16 NOTE — Telephone Encounter (Signed)
 Pharmacy Patient Advocate Encounter  Received notification from [facility] that Prior Authorization for Armodafinil  150MG  tablets  has been APPROVED from 08/16/2024 to 08/16/2025   PA #/Case ID/Reference #: [id number]

## 2024-08-16 NOTE — Telephone Encounter (Signed)
 Pharmacy Patient Advocate Encounter   Received notification from Patient Pharmacy that prior authorization for Armodafinil  150MG  tablets is required/requested.   Insurance verification completed.   The patient is insured through [facility] [employer] .   Per test claim: PA required; PA submitted to above mentioned insurance via Latent Key/confirmation #/EOC  [id number] Status is pending

## 2024-08-19 NOTE — Progress Notes (Unsigned)
 Patient ID: Misty Curtis, female    DOB: 31-Aug-1961, 63 y.o.   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 One episode of asthma several weeks ago when she was exerting outdoors washing cars. Relieved with albuterol  rescue. Has meds. No new concerns. Neurology manages somnolence. Rheum manages? Lupus with  methotrexate , Plaquenil .  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    Review of Systems-see HPI  + = positive Constitutional:   No-   weight loss, night sweats, fevers, chills, fatigue, lassitude. HEENT:   No-  headaches, difficulty swallowing, tooth/dental problems, sore throat,       No-  sneezing, itching, ear ache, +nasal congestion, +post nasal drip,  CV:  No-   chest pain, orthopnea, PND, swelling in lower extremities, anasarca, dizziness, palpitations Resp: No-   shortness of breath with exertion or at rest.              No-   productive cough,  No  non-productive cough,  No- coughing up of blood.              No-   change in color of mucus.  Little wheezing.   Skin: +rash or lesions. GI:  No-   heartburn, indigestion, abdominal pain, nausea, vomiting,  GU:  MS:  No-   joint pain or swelling.  Neuro-     nothing unusual Psych:  No- change in mood or affect. No depression or anxiety.  No memory loss.  Objective:   Physical Exam General- Alert, Oriented, Affect-appropriate, Distress- none acute, + overweight Skin- rash-none, lesions- none, excoriation- none Lymphadenopathy- none Head- atraumatic            Eyes- Gross vision intact, PERRLA, conjunctivae clear secretions            Ears- Hearing, canals-normal. TMs look normal            Nose- +turbinate hypertrophy, no-Septal dev, mucus, polyps, erosion, perforation             Throat- Mallampati II , mucosa, drainage- none, tonsils- atrophic.                    No visible postnasal drainage and minimal redness of her pharynx.                  Neck- flexible , trachea midline, no stridor , thyroid nl, carotid no bruit Chest - symmetrical excursion , unlabored           Heart/CV- RRR , no murmur , no gallop  ,  no rub, nl s1 s2                           - JVD- none , edema- none, stasis changes- none, varices- none           Lung- clear to P&A, wheeze- none, cough- none , dullness-none, rub- none           Chest wall-  Abd-  Br/ Gen/ Rectal- Not done, not indicated Extrem- +R foot in boot Neuro- grossly intact to observation

## 2024-08-20 ENCOUNTER — Ambulatory Visit: Admitting: Internal Medicine

## 2024-08-20 ENCOUNTER — Encounter: Payer: Self-pay | Admitting: Internal Medicine

## 2024-08-20 VITALS — BP 130/72 | HR 81 | Ht 63.0 in | Wt 197.0 lb

## 2024-08-20 DIAGNOSIS — J4520 Mild intermittent asthma, uncomplicated: Secondary | ICD-10-CM

## 2024-08-20 NOTE — Patient Instructions (Signed)
 Glad you are doing well - please call if we can help. As discussed, you may choose to have your primary care provider manage the respiratory issues unless our help is needed.

## 2024-08-29 ENCOUNTER — Ambulatory Visit: Payer: BC Managed Care – PPO | Admitting: Internal Medicine

## 2024-09-05 ENCOUNTER — Other Ambulatory Visit: Payer: Self-pay

## 2024-09-05 MED ORDER — FLUTICASONE PROPIONATE 50 MCG/ACT NA SUSP: NASAL | 3 refills | Status: AC

## 2024-09-05 NOTE — Telephone Encounter (Signed)
 Received incoming fax for refill on fluticasone  nasal spray.  Verbal okay per Dr. Neysa for 90 day supply with one year refills. Sent rx via [facility] electronic fill.

## 2024-09-17 DIAGNOSIS — Z79899 Other long term (current) drug therapy: Secondary | ICD-10-CM

## 2024-09-17 DIAGNOSIS — M359 Systemic involvement of connective tissue, unspecified: Secondary | ICD-10-CM

## 2024-09-17 DIAGNOSIS — I7300 Raynaud's syndrome without gangrene: Secondary | ICD-10-CM

## 2024-09-17 NOTE — Telephone Encounter (Signed)
 Okay to place futuer orders for sed rate, double-stranded DNA, complements, and urine protein creatinine ratio

## 2024-09-21 ENCOUNTER — Other Ambulatory Visit: Payer: Self-pay | Admitting: Internal Medicine

## 2024-09-30 ENCOUNTER — Other Ambulatory Visit: Payer: Self-pay

## 2024-09-30 DIAGNOSIS — M359 Systemic involvement of connective tissue, unspecified: Secondary | ICD-10-CM

## 2024-09-30 DIAGNOSIS — Z79899 Other long term (current) drug therapy: Secondary | ICD-10-CM

## 2024-09-30 DIAGNOSIS — I7300 Raynaud's syndrome without gangrene: Secondary | ICD-10-CM

## 2024-10-01 ENCOUNTER — Ambulatory Visit: Payer: Self-pay | Admitting: Rheumatology

## 2024-10-01 ENCOUNTER — Other Ambulatory Visit: Payer: Self-pay | Admitting: Internal Medicine

## 2024-10-01 ENCOUNTER — Ambulatory Visit: Payer: Self-pay | Admitting: Physician Assistant

## 2024-10-01 LAB — PROTEIN / CREATININE RATIO, URINE
Creatinine, Urine: 53 mg/dL (ref 20–275)
Total Protein, Urine: <4 mg/dL — ABNORMAL LOW (ref 5–24)

## 2024-10-01 LAB — ANTI-DNA ANTIBODY, DOUBLE-STRANDED: ds DNA Ab: 1 [IU]/mL

## 2024-10-01 LAB — COMPLETE METABOLIC PANEL WITHOUT GFR
AG Ratio: 1.6 (calc) (ref 1.0–2.5)
ALT: 22 U/L (ref 6–29)
AST: 37 U/L — ABNORMAL HIGH (ref 10–35)
Albumin: 4.2 g/dL (ref 3.6–5.1)
Alkaline phosphatase (APISO): 92 U/L (ref 37–153)
BUN: 17 mg/dL (ref 7–25)
CO2: 30 mmol/L (ref 20–32)
Calcium: 9.5 mg/dL (ref 8.6–10.4)
Chloride: 103 mmol/L (ref 98–110)
Creat: 0.95 mg/dL (ref 0.50–1.05)
Globulin: 2.6 g/dL (ref 1.9–3.7)
Glucose, Bld: 96 mg/dL (ref 65–139)
Potassium: 3.8 mmol/L (ref 3.5–5.3)
Sodium: 140 mmol/L (ref 135–146)
Total Bilirubin: 0.4 mg/dL (ref 0.2–1.2)
Total Protein: 6.8 g/dL (ref 6.1–8.1)

## 2024-10-01 LAB — CBC WITH DIFFERENTIAL/PLATELET
Absolute Lymphocytes: 2756 {cells}/uL (ref 850–3900)
Absolute Monocytes: 556 {cells}/uL (ref 200–950)
Basophils Absolute: 50 {cells}/uL (ref 0–200)
Basophils Relative: 0.6 %
Eosinophils Absolute: 183 {cells}/uL (ref 15–500)
Eosinophils Relative: 2.2 %
HCT: 44.6 % (ref 35.0–45.0)
Hemoglobin: 15.1 g/dL (ref 11.7–15.5)
MCH: 30.9 pg (ref 27.0–33.0)
MCHC: 33.9 g/dL (ref 32.0–36.0)
MCV: 91.2 fL (ref 80.0–100.0)
MPV: 9.8 fL (ref 7.5–12.5)
Monocytes Relative: 6.7 %
Neutro Abs: 4756 {cells}/uL (ref 1500–7800)
Neutrophils Relative %: 57.3 %
Platelets: 330 Thousand/uL (ref 140–400)
RBC: 4.89 Million/uL (ref 3.80–5.10)
RDW: 14.2 % (ref 11.0–15.0)
Total Lymphocyte: 33.2 %
WBC: 8.3 Thousand/uL (ref 3.8–10.8)

## 2024-10-01 LAB — C3 AND C4
C3 Complement: 157 mg/dL (ref 83–193)
C4 Complement: 23 mg/dL (ref 15–57)

## 2024-10-01 LAB — SEDIMENTATION RATE: Sed Rate: 6 mm/h (ref 0–30)

## 2024-10-01 NOTE — Progress Notes (Signed)
 CBC and CMP are normal.  Liver function is mildly elevated.  Patient should avoid all NSAIDs and alcohol use.

## 2024-10-01 NOTE — Progress Notes (Signed)
 No proteinuria.   ESR WNL Complements WNL

## 2024-10-02 NOTE — Telephone Encounter (Signed)
 Pt requesting refill of Bactroban , not mentioned in LOV note. Please advise, thank you!

## 2024-10-02 NOTE — Telephone Encounter (Signed)
 Bactroban refilled

## 2024-10-02 NOTE — Progress Notes (Signed)
 Liver function is mildly elevated.  CBC normal.  Patient should avoid NSAIDs and alcohol use.  Please forward results to PCP.

## 2024-10-02 NOTE — Progress Notes (Signed)
dsDNA is negative.  Labs are not consistent with a flare.

## 2024-10-04 ENCOUNTER — Other Ambulatory Visit: Payer: Self-pay | Admitting: Physician Assistant

## 2024-10-04 NOTE — Telephone Encounter (Signed)
 Last Fill: 03/05/2024  Next Visit: 11/19/2024  Last Visit: 06/07/2024  Dx: Autoimmune disease   Current Dose per office note on 06/07/2024: not discussed  Okay to refill Zofran ?

## 2024-10-18 ENCOUNTER — Other Ambulatory Visit: Payer: Self-pay | Admitting: Rheumatology

## 2024-10-18 DIAGNOSIS — M359 Systemic involvement of connective tissue, unspecified: Secondary | ICD-10-CM

## 2024-10-21 ENCOUNTER — Telehealth: Payer: Self-pay

## 2024-10-21 NOTE — Telephone Encounter (Signed)
 Patient called to report she had a plaquenil  eye exam in May 2025 at Dr. Garnette Ba office in [place]. She has attempted to get the report and his office number is disconnected. I looked the office/doctor up on [employer] and he unfortunately passed away in 2024-06-25. Patient is unable to get the records as she has no way of contacting the office and she has never been notified of the actual closure. Patient states she was advised at the appointment that the exam was normal and there were no concerns.   I advised patient to call and schedule with a new ophthalmologist. Patient will research ophthalmologist in her area and call to schedule.

## 2024-10-21 NOTE — Telephone Encounter (Signed)
 Last Fill: 06/04/2024  Eye exam: 06/26/2023 WNL   Labs: 09/30/2024 CBC and CMP are normal. Liver function is mildly elevated   Next Visit: 11/19/2024  Last Visit: 06/07/2024  IK:Jlunpfflwz disease   Current Dose per office note 06/07/2024: Plaquenil  200 mg 1 tablet by mouth daily  Patient advised she is due to update her PLQ eye exam. Patient states she updated it this summer and will contact the eye doctor to have them send results.   Okay to refill Plaquenil ?

## 2024-11-06 NOTE — Progress Notes (Signed)
 Office Visit Note  Patient: Misty Curtis             Date of Birth: [birth year]           MRN: [id number]             PCP: Lizette Macario BRAVO, NP (Inactive) Referring: No ref. provider found Visit Date: 11/19/2024 Occupation: Data Unavailable  Subjective:  Medication management  History of Present Illness: Misty Curtis is a 63 y.o. female with autoimmune disease.  She returns today after her last visit in June 2025.  She continues to have pain and discomfort in some of her joints.  She states she has been taking hydroxychloroquine  200 mg daily along with methotrexate  5 tablets weekly and folic acid  1 mg daily without any interruption.  She gives history of morning stiffness lasting for about 30 minutes and some nocturnal pain.  She has difficulty climbing stairs.  She gives history of dry mouth and dry eyes.  There is no history of oral ulcers, nasal ulcers, malar rash, photosensitivity, Raynaud's or lymphadenopathy.  She has been experiencing increased discomfort with the weather change.    Activities of Daily Living:  Patient reports morning stiffness for 30 minutes.   Patient Reports nocturnal pain.  Difficulty dressing/grooming: Denies Difficulty climbing stairs: Denies Difficulty getting out of chair: Reports Difficulty using hands for taps, buttons, cutlery, and/or writing: Reports  Review of Systems  Constitutional:  Positive for fatigue.  HENT:  Positive for mouth dryness. Negative for mouth sores.   Eyes:  Positive for dryness.  Respiratory:  Negative for shortness of breath.   Cardiovascular:  Negative for chest pain and palpitations.  Gastrointestinal:  Positive for diarrhea. Negative for blood in stool and constipation.  Endocrine: Negative for increased urination.  Genitourinary:  Negative for involuntary urination.  Musculoskeletal:  Positive for joint pain, gait problem, joint pain, joint swelling, myalgias, morning stiffness, muscle tenderness and myalgias. Negative for  muscle weakness.  Skin:  Positive for sensitivity to sunlight. Negative for color change, rash and hair loss.  Allergic/Immunologic: Positive for susceptible to infections.  Neurological:  Negative for dizziness and headaches.  Hematological:  Negative for swollen glands.  Psychiatric/Behavioral:  Negative for depressed mood and sleep disturbance. The patient is not nervous/anxious.     PMFS History:  Patient Active Problem List   Diagnosis Date Noted   Hypersomnia 04/18/2023   Heel pain, chronic, left 09/30/2020   Achilles tendinitis, left leg 08/19/2020   Heel pain, chronic, right 05/02/2019   Thrush 06/11/2018   Acquired hypothyroidism 12/08/2016   Autoimmune disease 12/06/2016   Other fatigue 12/06/2016   Raynaud's disease without gangrene 12/06/2016   High risk medication use 12/06/2016   Allergic asthma, mild intermittent, uncomplicated 12/06/2016   History of hypertension 12/06/2016   Excessive daytime sleepiness 07/26/2016   Snoring 07/26/2016   Laryngitis 11/10/2012   Postnasal drip 05/02/2012   Acute sinusitis, unspecified 10/24/2011   HYPERTENSION 12/14/2009   BRONCHITIS 12/14/2009   LUPUS 12/14/2009   History of melanoma 12/14/2009   Seasonal and perennial allergic rhinitis 12/14/2009    Past Medical History:  Diagnosis Date   Allergic rhinitis    skin test 01-18-10   Bronchitis    Cataract    left eye, per patient   Lupus (systemic lupus erythematosus) (HCC)    joints and skin rash. Dr. Dolphus   Melanoma [facility])    Rhinosinusitis    Skin cancer     Family History  Problem Relation Age  of Onset   Stroke Mother    Allergies Father    Heart disease Father        bacterial endocarditis   Hypothyroidism Sister    Hypothyroidism Sister    Hashimoto's thyroiditis Sister    Cancer Maternal Grandmother    Hepatitis Son    Past Surgical History:  Procedure Laterality Date   ABDOMINAL HYSTERECTOMY     ACHILLES TENDON REPAIR Bilateral    Dr. Anderson     BREAST LUMPECTOMY Left    08/21/2019, 09/20/2019   KNEE ARTHROPLASTY     skin cancer extraction     skin cancer extraction  2023   right side of face   Social History   Tobacco Use   Smoking status: Never    Passive exposure: Past   Smokeless tobacco: Never  Vaping Use   Vaping status: Never Used  Substance Use Topics   Alcohol use: Yes    Alcohol/week: 1.0 standard drink of alcohol    Types: 1 Standard drinks or equivalent per week    Comment: 1 MONTHLY   Drug use: No   Social History   Social History Narrative   Not on file     Immunization History  Administered Date(s) Administered   Influenza Split 09/19/2011, 09/12/2012, 09/02/2013   Influenza-Unspecified 09/19/2011, 09/12/2012, 09/02/2013, 10/19/2014   Moderna Covid-19 Fall Seasonal Vaccine 38yrs & older 12/01/2022   Moderna Sars-Covid-2 Vaccination 02/26/2020, 03/25/2020, 12/09/2020   Pneumococcal Polysaccharide-23 08/19/2010   Pneumococcal-Unspecified 07/01/2020     Objective: Vital Signs: BP (!) 162/88   Pulse 67   Temp 98 F (36.7 C)   Resp 14   Ht 5' 3 (1.6 m)   Wt 202 lb (91.6 kg)   BMI 35.78 kg/m    Physical Exam Vitals and nursing note reviewed.  Constitutional:      Appearance: She is well-developed.  HENT:     Head: Normocephalic and atraumatic.  Eyes:     Conjunctiva/sclera: Conjunctivae normal.  Cardiovascular:     Rate and Rhythm: Normal rate and regular rhythm.     Heart sounds: Normal heart sounds.  Pulmonary:     Effort: Pulmonary effort is normal.     Breath sounds: Normal breath sounds.  Abdominal:     General: Bowel sounds are normal.     Palpations: Abdomen is soft.  Musculoskeletal:     Cervical back: Normal range of motion.  Lymphadenopathy:     Cervical: No cervical adenopathy.  Skin:    General: Skin is warm and dry.     Capillary Refill: Capillary refill takes less than 2 seconds.  Neurological:     Mental Status: She is alert and oriented to person, place, and  time.  Psychiatric:        Behavior: Behavior normal.      Musculoskeletal Exam: Cervical, thoracic and lumbar spine Juengel range of motion.  She has thoracic kyphosis without any tenderness.  She had good mobility in her lumbar spine.  Shoulders, elbows, wrist joints with good range of motion.  She had bilateral PIP and DIP thickening with no synovitis.  Hip joints and knee joints in good range of motion without any warmth swelling or effusion.  There was no tenderness over ankles or MTPs.  CDAI Exam: CDAI Score: -- Patient Global: --; Provider Global: -- Swollen: --; Tender: -- Joint Exam 11/19/2024   No joint exam has been documented for this visit   There is currently no information documented on the homunculus.  Go to the Rheumatology activity and complete the homunculus joint exam.  Investigation: No additional findings.  Imaging: No results found.  Recent Labs: Lab Results  Component Value Date   WBC 8.3 09/30/2024   HGB 15.1 09/30/2024   PLT 330 09/30/2024   NA 140 09/30/2024   K 3.8 09/30/2024   CL 103 09/30/2024   CO2 30 09/30/2024   GLUCOSE 96 09/30/2024   BUN 17 09/30/2024   CREATININE 0.95 09/30/2024   BILITOT 0.4 09/30/2024   ALKPHOS 82 06/02/2017   AST 37 (H) 09/30/2024   ALT 22 09/30/2024   PROT 6.8 09/30/2024   ALBUMIN 4.1 06/02/2017   CALCIUM 9.5 09/30/2024   GFRAA 70 04/12/2021     Speciality Comments: PLQ eye exam: 06/26/2023 WNL at Dr DELOS. Laurice, OD Follow up in 1 year  Patient states she has an eye exam in 05/2024  Patient states she has an appointment with Vannie Slack in [place] and will call us  with the date  Procedures:  No procedures performed Allergies: Latex, Molds & smuts, Capsicum annuum extract & derivative (bell pepper) [capsicum], Cat dander, Lactose intolerance (gi), Milk (cow), Milk-related compounds, Modafinil , Neomycin-bacitracin zn-polymyx, Other, Pneumococcal vaccine, Strawberry extract, Sulfa antibiotics, Wixela inhub  [fluticasone -salmeterol], Bacitracin-polymyxin b, Onion, and Pollen extract   Assessment / Plan:     Visit Diagnoses: Autoimmune disease - History of positive ANA, DS DNA, Raynauds, fatigue, oral ulcers, nasal ulcers, arthralgias, sicca: -Her double-stranded ENA has been consistently negative for a while.  Complements have been normal.  She gives history of sicca symptoms.  She has not had any problems with oral ulcers or nasal ulcers.  No synovitis was noted on the examination.September 30, 2024 sed rate 6, C3-C4 normal, double-stranded DNA negative.  Plan: hydroxychloroquine  (PLAQUENIL ) 200 MG tablet, Protein / creatinine ratio, urine, Protein / creatinine ratio, urine, CBC with Differential/Platelet, Comprehensive metabolic panel with GFR, Anti-DNA antibody, double-stranded, C3 and C4, Sedimentation rate, ANA.  We could not get enough urine sample for urine protein creatinine ratio in October.  Will recheck urine protein creatinine ratio today.  High risk medication use - Plaquenil  200 mg 1 tablet by mouth daily, Methotrexate  5 tablets by mouth once a week, and folic acid  1 mg daily. PLQ eye exam: 06/26/2023.  Patient states that her previous ophthalmologist passed away.  She could not get records from the previous ophthalmologist.  She had eye examination this summer.  She is trying to establish with a new ophthalmologist.  September 30, 2024 CBC and CMP were normal except LFTs were mildly elevated.  Patient states she was taking Tylenol  at the time.  Raynaud's disease without gangrene-currently not symptomatic.  However she does noticed increased symptoms during the winter months.  No digital ulcers were noted.  She had good capillary refill.  Keeping core temperature warm and warm clothing was discussed.  Primary osteoarthritis of both hands-she has bilateral PIP and DIP thickening.  Joint protection and muscle strengthening was discussed.  Right carpal tunnel syndrome-she intermittent symptoms.  She uses  a brace.  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  Other medical problems listed as follows:  History of fatigue  History of melanoma  History of hypertension-blood pressure was elevated at 165/88.  Repeat blood pressure was 170/94.  She was advised to monitor blood pressure closely and follow-up with the PCP.  History of hypothyroidism  History of asthma  History of sleep apnea  Orders: Orders Placed This Encounter  Procedures   Protein / creatinine ratio, urine   Protein / creatinine ratio, urine   CBC with Differential/Platelet   Comprehensive metabolic panel with GFR   Anti-DNA antibody, double-stranded   C3 and C4   Sedimentation rate   ANA   Meds ordered this encounter  Medications   hydroxychloroquine  (PLAQUENIL ) 200 MG tablet    Sig: Take 1 tablet (200 mg total) by mouth daily.    Dispense:  90 tablet    Refill:  0     Follow-Up Instructions: Return in about 5 months (around 04/19/2025) for Autoimmune disease.   Maya Nash, MD  Note - This record has been created using Animal nutritionist.  Chart creation errors have been sought, but may not always  have been located. Such creation errors do not reflect on  the standard of medical care.

## 2024-11-19 ENCOUNTER — Ambulatory Visit: Payer: Self-pay | Attending: Rheumatology | Admitting: Rheumatology

## 2024-11-19 ENCOUNTER — Encounter: Payer: Self-pay | Admitting: Rheumatology

## 2024-11-19 VITALS — BP 170/94 | HR 68 | Temp 98.0°F | Resp 14 | Ht 63.0 in | Wt 202.0 lb

## 2024-11-19 DIAGNOSIS — Z79899 Other long term (current) drug therapy: Secondary | ICD-10-CM

## 2024-11-19 DIAGNOSIS — Z87898 Personal history of other specified conditions: Secondary | ICD-10-CM

## 2024-11-19 DIAGNOSIS — G5601 Carpal tunnel syndrome, right upper limb: Secondary | ICD-10-CM | POA: Diagnosis not present

## 2024-11-19 DIAGNOSIS — Z8669 Personal history of other diseases of the nervous system and sense organs: Secondary | ICD-10-CM

## 2024-11-19 DIAGNOSIS — I7300 Raynaud's syndrome without gangrene: Secondary | ICD-10-CM

## 2024-11-19 DIAGNOSIS — M6788 Other specified disorders of synovium and tendon, other site: Secondary | ICD-10-CM

## 2024-11-19 DIAGNOSIS — M359 Systemic involvement of connective tissue, unspecified: Secondary | ICD-10-CM

## 2024-11-19 DIAGNOSIS — Z8639 Personal history of other endocrine, nutritional and metabolic disease: Secondary | ICD-10-CM

## 2024-11-19 DIAGNOSIS — Z8709 Personal history of other diseases of the respiratory system: Secondary | ICD-10-CM

## 2024-11-19 DIAGNOSIS — M19042 Primary osteoarthritis, left hand: Secondary | ICD-10-CM

## 2024-11-19 DIAGNOSIS — Z85820 Personal history of malignant melanoma of skin: Secondary | ICD-10-CM

## 2024-11-19 DIAGNOSIS — M19041 Primary osteoarthritis, right hand: Secondary | ICD-10-CM

## 2024-11-19 DIAGNOSIS — Z8679 Personal history of other diseases of the circulatory system: Secondary | ICD-10-CM

## 2024-11-19 MED ORDER — HYDROXYCHLOROQUINE SULFATE 200 MG PO TABS: 200.0000 mg | ORAL_TABLET | Freq: Every day | ORAL | 0 refills | Status: AC

## 2024-11-20 LAB — PROTEIN / CREATININE RATIO, URINE
Creatinine, Urine: 38 mg/dL (ref 20–275)
Total Protein, Urine: <4 mg/dL — ABNORMAL LOW (ref 5–24)

## 2024-11-26 ENCOUNTER — Ambulatory Visit: Payer: BC Managed Care – PPO | Admitting: Neurology

## 2024-12-03 ENCOUNTER — Other Ambulatory Visit (HOSPITAL_COMMUNITY): Payer: Self-pay

## 2024-12-03 ENCOUNTER — Telehealth: Payer: Self-pay | Admitting: Pharmacist

## 2024-12-03 NOTE — Telephone Encounter (Signed)
 Clinical questions have been answered and PA submitted. PA currently Pending. Please be advised that most companies allow up to 30 days to make a decision. We will advise when a determination has been made, or follow up in 1 week.   Please reach out to our team, Rx Prior Auth Pool, if you haven't heard back in a week.

## 2024-12-03 NOTE — Telephone Encounter (Signed)
 Pharmacy Patient Advocate Encounter  Received notification from [employer] that Prior Authorization for Armodafinil  150 MG tablet has been APPROVED from 12/20/2023 to 12/18/2025   PA #/Case ID/Reference #: [id number]

## 2024-12-03 NOTE — Telephone Encounter (Signed)
 Pharmacy Patient Advocate Encounter   Received notification from Patient Pharmacy that prior authorization for Armodafinil  150MG  tablets is required/requested.   Insurance verification completed.   The patient is insured through [employer].   Per test claim: PA required; PA started via CoverMyMeds. KEY [id number] . Waiting for clinical questions to populate.

## 2024-12-06 ENCOUNTER — Other Ambulatory Visit: Payer: Self-pay | Admitting: Physician Assistant

## 2024-12-06 NOTE — Telephone Encounter (Signed)
 Last Fill: 08/12/2024  Labs: 09/30/2024 CBC and CMP are normal. Liver function is mildly elevated.   Next Visit: 04/22/2025  Last Visit: 11/19/2024  DX: Autoimmune disease   Current Dose per office note 11/19/2024: Methotrexate  5 tablets by mouth once a week   Okay to refill Methotrexate ?

## 2024-12-25 ENCOUNTER — Ambulatory Visit: Admitting: Neurology

## 2024-12-25 ENCOUNTER — Encounter: Payer: Self-pay | Admitting: Neurology

## 2024-12-25 VITALS — BP 138/86 | HR 83 | Ht 63.0 in | Wt 204.0 lb

## 2024-12-25 DIAGNOSIS — R5383 Other fatigue: Secondary | ICD-10-CM

## 2024-12-25 DIAGNOSIS — G4719 Other hypersomnia: Secondary | ICD-10-CM | POA: Diagnosis not present

## 2024-12-25 NOTE — Patient Instructions (Signed)
 Great to see you today! Continue current dose of armodafinil  Call when refill needed Follow-up in 1 year.  Thanks!!

## 2024-12-25 NOTE — Progress Notes (Signed)
 "  PATIENT: Misty Curtis DOB: [birth year]  REASON FOR VISIT: follow up for hypersomnia  HISTORY FROM: patient PRIMARY NEUROLOGIST: Dr. Chalice   ASSESSMENT AND PLAN 64 y.o. year old female  has a past medical history of Allergic rhinitis, Bronchitis, Cataract, Lupus (systemic lupus erythematosus) (HCC), Melanoma (HCC), Rhinosinusitis, and Skin cancer. here with:  1.  Idiopathic hypersomnia   - Doing well with armodafinil  150 mg in AM, pleased with regimen  - Refill not needed, most mornings only takes 1/2 tablet - Previously tried and failed: Sunosi  was denied, Provigil  increased BP, previously tried CPAP but offer no clear benefit to her daytime sleepiness, HLA narcolepsy testing was negative - Follow-up in 1 year or sooner if needed  HISTORY OF PRESENT ILLNESS: Curtis 12/25/2024  12/25/24 SS: Is retired now. Has been traveling. Taking armodafinil  150 mg, most days only takes 1/2 pill around 8 AM, if she volunteers will take a whole tablet. On off days, she sleeps late, the 1/2 tablet does her well until 9:00 PM, can still sleep, otherwise a full pill keeps her up at night. Please with current regimen, helps her stay awake. BP is doing well. Health has been doing good. No side effects from medication. ESS 12.  11/14/23 SS: Sunosi  was denied. We switched to armodafinil  in June 2024. 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 Takes armodafinil  at 6 AM, experiences good energy and no hypersomnia till about 9 PM.  She sleeps great at night.  She gets about 1 to 2 hours of extended benefit with armodafinil  versus Provigil .  She is currently pleased.  Blood pressure was elevated Curtis, but has been running in the 120s over 70s.  Denies any side effects of armodafinil .  04/18/23 SS: Is now retired. Goes once a month to [place] to care for her uncle. On Provigil  150 mg in AM. Has noticed that when she takes it, her BP is higher. She really only takes 100 mg when she takes it. Still think she needs it, does  substitute occasionally. Takes around 7 AM, usually keeps her awake. The Provigil  keeps the long dozing spells away. If she is focused, she will not fall asleep. ESS 12. She drinks 3 glasses of sweet tea.  Update 03/22/22 SS: Misty Curtis for follow-up. Taking Provigil  150 mg in AM at 8 AM. Keeps her awake and active, and she still can sleep. Is retired fully now. Substitutes when she wants, keeps her grandchildren. Had lupus flare back in January, still on Methotrexate  and Plaquenil . Mother passed away in 08/26/25. She is assisting her elderly uncle in his affairs, goes to [place] once a month. BP up Curtis, took her medication, keeps check on it at home, runs in 120's.  Update 02/19/21 SS: Misty Curtis is a 64 year old female with history of idiopathic hypersomnia.  Remains on Provigil  150 mg daily.  200 mg was too much, 100 mg was not enough.  The 150 mg dosage seems to work well.  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  Had a rough couple months with her lupus.  BP elevated Curtis, checks at home, usually 140/80.  Recently had left Achilles reconstructive surgery.  Here Curtis for evaluation unaccompanied. FSS was 32.   Update February 26, 2020 SS: Misty Curtis is a 64 year old female with history of idiopathic hypersomnia.  She remains on Provigil  200 mg daily, however she is only taking  half tablet.  When she takes the 200 mg, she says she will stay up till midnight.  When taking 100 mg, around 630, she will lay on the couch, fall asleep. She seems to do well during the day, up until 6:30 pm. 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  In the last several months, she has had Achilles tendon reconstruction, and 2 breast lumpectomies.  In the last year, she has gained about 10 pounds.  She is not sure if her increased drowsiness, is related to deconditioning or weight gain?  She would like to try dose  modification.  She presents Curtis for evaluation unaccompanied. ESS was 12.  HISTORY 02/20/2019 SS: Misty Curtis is a 64 year old female with a history of idiopathic hypersomnia.  She is currently taking Provigil  200 mg daily however she is only taking half tablet.  She reports she is tolerating the medication well.  She will take it around 6:30 AM and she reports benefit until about 9:30 PM whenever she goes to bed.  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  She reports she is able to do her job well without significant fatigue.  She returns Curtis requesting a refill on her Provigil .  She presents Curtis for evaluation unaccompanied.  She denies any new problems or concerns.  She is wearing a boot to her right foot that is healing from a prior injury.  REVIEW OF SYSTEMS: Out of a complete 14 system review of symptoms, the patient complains only of the following symptoms, and all other reviewed systems are negative.  See HPI  ALLERGIES: Allergies  Allergen Reactions   Latex Other (See Comments) and Rash   Molds & Smuts Rash and Shortness Of Breath    Other Reaction(s): Bronchospasm, Cough, Respiratory Distress   Capsicum Annuum Extract & Derivative (Bell Pepper) [Capsicum] Swelling    Green peppers   Cat Dander     Other Reaction(s): Other (See Comments)   Lactose Intolerance (Gi) Diarrhea   Milk (Cow) Hives and Itching    Other Reaction(s): Diarrhea, GI Intolerance   Milk-Related Compounds Diarrhea   Modafinil  Hypertension   Neomycin-Bacitracin Zn-Polymyx     REACTION: rash   Other     Pollen and mold.    Pneumococcal Vaccine Swelling and Other (See Comments)    Redness and warmth to arm   Strawberry Extract    Sulfa Antibiotics     Other Reaction(s): GI Intolerance   Wixela Inhub [Fluticasone -Salmeterol]     Per patient - caused throat to close and laryngitis   Bacitracin-Polymyxin B Rash   Onion Hives, Itching, Rash and Swelling    Also allergic to peppers and hot peppers and  dairy  Other Reaction(s): Diarrhea, GI Intolerance  Also allergic to peppers and hot peppers, , Also allergic to peppers and hot peppers and dairy   Pollen Extract Swelling and Rash    Other Reaction(s): Bronchospasm, Respiratory Distress    HOME MEDICATIONS: Outpatient Medications Prior to Visit  Medication Sig Dispense Refill   albuterol  (VENTOLIN  HFA) 108 (90 Base) MCG/ACT inhaler Inhale 2 puffs into the lungs every 6 (six) hours as needed for wheezing or shortness of breath. 1 each 12   Armodafinil  150 MG tablet Take 1 tablet (150 mg total) by mouth in the morning. 30 tablet 5   Ascorbic Acid (VITAMIN C) 500 MG tablet Take 500 mg by mouth daily.     B Complex Vitamins (VITAMIN B COMPLEX PO) Take  by mouth.     BIOTENE DRY MOUTH (BIOTENE) LIQD Place 1 application onto teeth as needed.     budesonide -formoterol  (SYMBICORT ) 80-4.5 MCG/ACT inhaler INHALE 2 PUFFS INTO THE LUNGS IN THE MORNING AND AT BEDTIME 10.2 g 9   Cholecalciferol (VITAMIN D ) 2000 units CAPS Take by mouth.     clotrimazole -betamethasone  (LOTRISONE ) cream Apply topically 2 (two) times daily. 30 g prn   diclofenac  Sodium (VOLTAREN ) 1 % GEL APPLY 2 TO 4 GRAMS TOPICALLY TO AFFECTED JOINT FOUR TIMES DAILY AS NEEDED 400 g 2   Digestive Enzymes (DIGESTIVE ENZYME PO) Take by mouth.     fluticasone  (FLONASE ) 50 MCG/ACT nasal spray SHAKE LIQUID AND USE 2 SPRAYS IN EACH NOSTRIL DAILY 48 g 3   folic acid  (FOLVITE ) 1 MG tablet TAKE 1 TABLET(1 MG) BY MOUTH DAILY 90 tablet 3   hydroxychloroquine  (PLAQUENIL ) 200 MG tablet Take 1 tablet (200 mg total) by mouth daily. 90 tablet 0   lactase (LACTAID) 3000 UNITS tablet Take by mouth as needed.      levocetirizine (XYZAL) 5 MG tablet Take 5 mg by mouth every evening.     lisinopril (ZESTRIL) 20 MG tablet Take 10 mg by mouth every morning.     methotrexate  (RHEUMATREX) 2.5 MG tablet TAKE 5 TABLETS BY MOUTH 1 TIME WEEKLY 60 tablet 0   MISC NATURAL PRODUCTS PO Take by mouth. Flex Guard      montelukast  (SINGULAIR ) 10 MG tablet TAKE 1 TABLET(10 MG) BY MOUTH DAILY 30 tablet 10   mupirocin  ointment (BACTROBAN ) 2 % APPLY TOPICALLY TO THE AFFECTED AREA TWICE DAILY AS DIRECTED 22 g 12   NON FORMULARY Doterra Essential Oils:  Eucalyptus, Wintergreen, Lemongrass, Frankincense, Republic, On [employer]     ondansetron  (ZOFRAN ) 4 MG tablet TAKE 1 TABLET(4 MG) BY MOUTH EVERY 8 HOURS AS NEEDED FOR NAUSEA OR VOMITING 30 tablet 0   Spacer/Aero-Holding Chambers DEVI USE AS DIRECTED 1 each 0   Specialty Vitamins Products (CENTRUM PERFORMANCE) TABS Take 1 tablet by mouth daily.     SYNTHROID 75 MCG tablet Take 75 mcg by mouth every morning.     No facility-administered medications prior to visit.    PAST MEDICAL HISTORY: Past Medical History:  Diagnosis Date   Allergic rhinitis    skin test 01-18-10   Bronchitis    Cataract    left eye, per patient   Lupus (systemic lupus erythematosus) (HCC)    joints and skin rash. Dr. Dolphus   Melanoma [facility])    Rhinosinusitis    Skin cancer     PAST SURGICAL HISTORY: Past Surgical History:  Procedure Laterality Date   ABDOMINAL HYSTERECTOMY     ACHILLES TENDON REPAIR Bilateral    Dr. Anderson    BREAST LUMPECTOMY Left    08/21/2019, 09/20/2019   KNEE ARTHROPLASTY     skin cancer extraction     skin cancer extraction  2023   right side of face    FAMILY HISTORY: Family History  Problem Relation Age of Onset   Stroke Mother    Allergies Father    Heart disease Father        bacterial endocarditis   Hypothyroidism Sister    Hypothyroidism Sister    Hashimoto's thyroiditis Sister    Cancer Maternal Grandmother    Hepatitis Son     SOCIAL HISTORY: Social History   Socioeconomic History   Marital status: Married    Spouse name: Not on file   Number of children: 3  Years of education: Not on file   Highest education level: Not on file  Occupational History   Occupation: special ed teacher [place]  Tobacco Use   Smoking  status: Never    Passive exposure: Past   Smokeless tobacco: Never  Vaping Use   Vaping status: Never Used  Substance and Sexual Activity   Alcohol use: Yes    Alcohol/week: 1.0 standard drink of alcohol    Types: 1 Standard drinks or equivalent per week    Comment: 1 MONTHLY   Drug use: No   Sexual activity: Yes    Birth control/protection: None  Other Topics Concern   Not on file  Social History Narrative   Not on file   Social Drivers of Health   Tobacco Use: Low Risk (12/25/2024)   Patient History    Smoking Tobacco Use: Never    Smokeless Tobacco Use: Never    Passive Exposure: Past  Financial Resource Strain: Not on file  Food Insecurity: Low Risk (10/16/2024)   Received from [facility]   Epic    Within the past 12 months, you worried that your food would run out before you got money to buy more: Never true    Within the past 12 months, the food you bought just didn't last and you didn't have money to get more. : Never true  Transportation Needs: No Transportation Needs (10/16/2024)   Received from [facility]    In the past 12 months, has lack of reliable transportation kept you from medical appointments, meetings, work or from getting things needed for daily living? : No  Physical Activity: Not on file  Stress: Not on file  Social Connections: Not on file  Intimate Partner Violence: Not on file  Depression (EYV7-0): Not on file  Alcohol Screen: Not on file  Housing: Low Risk (10/16/2024)   Received from [facility]   Epic    What is your living situation Curtis?: I have a steady place to live    Think about the place you live. Do you have problems with any of the following? Choose all that apply:: None/None on this list  Utilities: Low Risk (10/16/2024)   Received from [facility]   Utilities    In the past 12 months has the electric, gas, oil, or water company threatened to shut off services in your home? : No  Health Literacy: Not on  file   PHYSICAL EXAM  Vitals:   12/25/24 1016 12/25/24 1022  BP: (!) 160/83 138/86  Pulse: 83   Weight: 204 lb (92.5 kg)   Height: 5' 3 (1.6 m)    Body mass index is 36.14 kg/m.  Generalized: Well developed, in no acute distress  Neurological examination  Mentation: Alert oriented to time, place, history taking. Follows all commands speech and language fluent Cranial nerve II-XII: Pupils were equal round reactive to light. Extraocular movements were full, visual field were full on confrontational test. Facial sensation and strength were normal. Head turning and shoulder shrug were normal and symmetric. Motor: The motor testing reveals 5 over 5 strength of all 4 extremities. Good symmetric motor tone is noted throughout.  Sensory: Sensory testing is intact to soft touch on all 4 extremities. No evidence of extinction is noted.  Coordination: Cerebellar testing reveals good finger-nose-finger and heel-to-shin bilaterally.  Gait and station: Gait is normal.   DIAGNOSTIC DATA (LABS, IMAGING, TESTING) - I reviewed patient records, labs, notes, testing and imaging myself where  available.  Lab Results  Component Value Date   WBC 8.3 09/30/2024   HGB 15.1 09/30/2024   HCT 44.6 09/30/2024   MCV 91.2 09/30/2024   PLT 330 09/30/2024      Component Value Date/Time   NA 140 09/30/2024 1429   K 3.8 09/30/2024 1429   CL 103 09/30/2024 1429   CO2 30 09/30/2024 1429   GLUCOSE 96 09/30/2024 1429   BUN 17 09/30/2024 1429   CREATININE 0.95 09/30/2024 1429   CALCIUM 9.5 09/30/2024 1429   PROT 6.8 09/30/2024 1429   ALBUMIN 4.1 06/02/2017 0945   AST 37 (H) 09/30/2024 1429   ALT 22 09/30/2024 1429   ALKPHOS 82 06/02/2017 0945   BILITOT 0.4 09/30/2024 1429   GFRNONAA 60 04/12/2021 1618   GFRAA 70 04/12/2021 1618   No results found for: CHOL, HDL, LDLCALC, LDLDIRECT, TRIG, CHOLHDL No results found for: YHAJ8R No results found for: VITAMINB12 No results found for:  TSH   Lauraine Born, AGNP-C, DNP 12/25/2024, 10:36 AM [facility] [address] [place], [place] [address] [phone]  "

## 2025-04-22 ENCOUNTER — Ambulatory Visit: Admitting: Rheumatology

## 2025-12-25 ENCOUNTER — Ambulatory Visit: Admitting: Neurology
